# Patient Record
Sex: Female | Born: 1960 | Race: White | Hispanic: No | Marital: Married | Smoking: Never smoker
Health system: Southern US, Community
[De-identification: ages and names within clinical notes are randomized; demographics above are authoritative.]

## PROBLEM LIST (undated history)

## (undated) DIAGNOSIS — E785 Hyperlipidemia, unspecified: Secondary | ICD-10-CM

## (undated) DIAGNOSIS — R2990 Unspecified symptoms and signs involving the nervous system: Secondary | ICD-10-CM

## (undated) DIAGNOSIS — E039 Hypothyroidism, unspecified: Secondary | ICD-10-CM

## (undated) DIAGNOSIS — K760 Fatty (change of) liver, not elsewhere classified: Secondary | ICD-10-CM

## (undated) DIAGNOSIS — Z87442 Personal history of urinary calculi: Secondary | ICD-10-CM

## (undated) DIAGNOSIS — D649 Anemia, unspecified: Secondary | ICD-10-CM

## (undated) DIAGNOSIS — Z86010 Personal history of colonic polyps: Secondary | ICD-10-CM

## (undated) DIAGNOSIS — T4145XA Adverse effect of unspecified anesthetic, initial encounter: Secondary | ICD-10-CM

## (undated) DIAGNOSIS — I10 Essential (primary) hypertension: Secondary | ICD-10-CM

## (undated) DIAGNOSIS — T8859XA Other complications of anesthesia, initial encounter: Secondary | ICD-10-CM

## (undated) DIAGNOSIS — B159 Hepatitis A without hepatic coma: Secondary | ICD-10-CM

## (undated) DIAGNOSIS — K648 Other hemorrhoids: Secondary | ICD-10-CM

## (undated) DIAGNOSIS — K635 Polyp of colon: Secondary | ICD-10-CM

## (undated) DIAGNOSIS — M19012 Primary osteoarthritis, left shoulder: Secondary | ICD-10-CM

## (undated) DIAGNOSIS — E119 Type 2 diabetes mellitus without complications: Secondary | ICD-10-CM

## (undated) DIAGNOSIS — K7581 Nonalcoholic steatohepatitis (NASH): Secondary | ICD-10-CM

## (undated) DIAGNOSIS — K219 Gastro-esophageal reflux disease without esophagitis: Secondary | ICD-10-CM

## (undated) DIAGNOSIS — I639 Cerebral infarction, unspecified: Secondary | ICD-10-CM

## (undated) DIAGNOSIS — K279 Peptic ulcer, site unspecified, unspecified as acute or chronic, without hemorrhage or perforation: Secondary | ICD-10-CM

## (undated) DIAGNOSIS — K222 Esophageal obstruction: Secondary | ICD-10-CM

## (undated) DIAGNOSIS — K589 Irritable bowel syndrome without diarrhea: Secondary | ICD-10-CM

## (undated) DIAGNOSIS — E079 Disorder of thyroid, unspecified: Secondary | ICD-10-CM

## (undated) DIAGNOSIS — H269 Unspecified cataract: Secondary | ICD-10-CM

## (undated) DIAGNOSIS — K2270 Barrett's esophagus without dysplasia: Secondary | ICD-10-CM

## (undated) DIAGNOSIS — G4730 Sleep apnea, unspecified: Secondary | ICD-10-CM

## (undated) DIAGNOSIS — G709 Myoneural disorder, unspecified: Secondary | ICD-10-CM

## (undated) DIAGNOSIS — Z5189 Encounter for other specified aftercare: Secondary | ICD-10-CM

## (undated) HISTORY — DX: Hepatitis a without hepatic coma: B15.9

## (undated) HISTORY — DX: Fatty (change of) liver, not elsewhere classified: K76.0

## (undated) HISTORY — DX: Anemia, unspecified: D64.9

## (undated) HISTORY — PX: SHOULDER SURGERY: SHX246

## (undated) HISTORY — PX: TONSILLECTOMY: SUR1361

## (undated) HISTORY — DX: Gastro-esophageal reflux disease without esophagitis: K21.9

## (undated) HISTORY — DX: Personal history of colonic polyps: Z86.010

## (undated) HISTORY — DX: Irritable bowel syndrome, unspecified: K58.9

## (undated) HISTORY — PX: ABDOMINAL HYSTERECTOMY: SHX81

## (undated) HISTORY — DX: Peptic ulcer, site unspecified, unspecified as acute or chronic, without hemorrhage or perforation: K27.9

## (undated) HISTORY — PX: COLONOSCOPY: SHX174

## (undated) HISTORY — DX: Myoneural disorder, unspecified: G70.9

## (undated) HISTORY — PX: HERNIA REPAIR: SHX51

## (undated) HISTORY — PX: FOOT SURGERY: SHX648

## (undated) HISTORY — PX: LUMBAR FUSION: SHX111

## (undated) HISTORY — DX: Esophageal obstruction: K22.2

## (undated) HISTORY — DX: Encounter for other specified aftercare: Z51.89

## (undated) HISTORY — DX: Disorder of thyroid, unspecified: E07.9

## (undated) HISTORY — DX: Nonalcoholic steatohepatitis (NASH): K75.81

## (undated) HISTORY — DX: Unspecified cataract: H26.9

## (undated) HISTORY — PX: KNEE SURGERY: SHX244

## (undated) HISTORY — DX: Essential (primary) hypertension: I10

## (undated) HISTORY — DX: Hyperlipidemia, unspecified: E78.5

## (undated) HISTORY — DX: Other hemorrhoids: K64.8

## (undated) HISTORY — PX: BACK SURGERY: SHX140

## (undated) HISTORY — PX: CHOLECYSTECTOMY: SHX55

## (undated) HISTORY — DX: Barrett's esophagus without dysplasia: K22.70

## (undated) HISTORY — PX: BLADDER SURGERY: SHX569

## (undated) HISTORY — DX: Type 2 diabetes mellitus without complications: E11.9

## (undated) HISTORY — PX: BREAST BIOPSY: SHX20

## (undated) HISTORY — DX: Polyp of colon: K63.5

## (undated) HISTORY — DX: Sleep apnea, unspecified: G47.30

## (undated) HISTORY — PX: CARPAL TUNNEL RELEASE: SHX101

---
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
## 1981-11-12 DIAGNOSIS — B159 Hepatitis A without hepatic coma: Secondary | ICD-10-CM

## 1981-11-12 HISTORY — DX: Hepatitis a without hepatic coma: B15.9

## 1996-11-12 HISTORY — PX: CHOLECYSTECTOMY, LAPAROSCOPIC: SHX56

## 1999-07-20 ENCOUNTER — Encounter: Payer: Self-pay | Admitting: Internal Medicine

## 1999-07-25 ENCOUNTER — Encounter: Payer: Self-pay | Admitting: Internal Medicine

## 1999-07-31 ENCOUNTER — Encounter: Payer: Self-pay | Admitting: Internal Medicine

## 1999-08-04 ENCOUNTER — Encounter: Payer: Self-pay | Admitting: Internal Medicine

## 1999-09-05 ENCOUNTER — Encounter: Payer: Self-pay | Admitting: Internal Medicine

## 2002-08-25 ENCOUNTER — Encounter: Payer: Self-pay | Admitting: Internal Medicine

## 2002-08-28 ENCOUNTER — Encounter: Payer: Self-pay | Admitting: Internal Medicine

## 2002-09-08 ENCOUNTER — Encounter: Payer: Self-pay | Admitting: Internal Medicine

## 2002-10-26 ENCOUNTER — Encounter: Payer: Self-pay | Admitting: Internal Medicine

## 2007-08-26 ENCOUNTER — Encounter: Payer: Self-pay | Admitting: Internal Medicine

## 2007-09-29 ENCOUNTER — Encounter: Payer: Self-pay | Admitting: Internal Medicine

## 2007-10-01 ENCOUNTER — Encounter: Payer: Self-pay | Admitting: Internal Medicine

## 2007-10-14 ENCOUNTER — Encounter: Payer: Self-pay | Admitting: Internal Medicine

## 2007-10-16 ENCOUNTER — Encounter: Payer: Self-pay | Admitting: Internal Medicine

## 2007-10-20 ENCOUNTER — Encounter: Payer: Self-pay | Admitting: Internal Medicine

## 2008-04-01 ENCOUNTER — Encounter: Payer: Self-pay | Admitting: Internal Medicine

## 2008-04-02 ENCOUNTER — Encounter: Payer: Self-pay | Admitting: Internal Medicine

## 2009-11-12 HISTORY — PX: COLOSTOMY: SHX63

## 2009-11-12 HISTORY — PX: UPPER GASTROINTESTINAL ENDOSCOPY: SHX188

## 2010-03-23 ENCOUNTER — Encounter (INDEPENDENT_AMBULATORY_CARE_PROVIDER_SITE_OTHER): Payer: Self-pay | Admitting: *Deleted

## 2010-05-29 ENCOUNTER — Ambulatory Visit: Payer: Self-pay | Admitting: Internal Medicine

## 2010-05-29 DIAGNOSIS — Z860100 Personal history of colon polyps, unspecified: Secondary | ICD-10-CM

## 2010-05-29 DIAGNOSIS — K7581 Nonalcoholic steatohepatitis (NASH): Secondary | ICD-10-CM | POA: Insufficient documentation

## 2010-05-29 DIAGNOSIS — K760 Fatty (change of) liver, not elsewhere classified: Secondary | ICD-10-CM

## 2010-05-29 DIAGNOSIS — E119 Type 2 diabetes mellitus without complications: Secondary | ICD-10-CM

## 2010-05-29 DIAGNOSIS — K7689 Other specified diseases of liver: Secondary | ICD-10-CM

## 2010-05-29 DIAGNOSIS — K2270 Barrett's esophagus without dysplasia: Secondary | ICD-10-CM | POA: Insufficient documentation

## 2010-05-29 HISTORY — DX: Type 2 diabetes mellitus without complications: E11.9

## 2010-05-29 HISTORY — DX: Personal history of colon polyps, unspecified: Z86.0100

## 2010-05-29 HISTORY — DX: Fatty (change of) liver, not elsewhere classified: K76.0

## 2010-05-29 HISTORY — DX: Personal history of colonic polyps: Z86.010

## 2010-05-29 LAB — CONVERTED CEMR LAB
ALT: 64 units/L — ABNORMAL HIGH (ref 0–35)
Alkaline Phosphatase: 64 units/L (ref 39–117)
INR: 1 (ref 0.8–1.0)
Total Bilirubin: 0.6 mg/dL (ref 0.3–1.2)

## 2010-06-01 ENCOUNTER — Encounter: Admission: RE | Admit: 2010-06-01 | Discharge: 2010-06-01 | Payer: Self-pay | Admitting: Orthopedic Surgery

## 2010-06-02 ENCOUNTER — Ambulatory Visit: Payer: Self-pay | Admitting: Internal Medicine

## 2010-06-06 ENCOUNTER — Encounter: Payer: Self-pay | Admitting: Internal Medicine

## 2010-06-16 ENCOUNTER — Telehealth (INDEPENDENT_AMBULATORY_CARE_PROVIDER_SITE_OTHER): Payer: Self-pay

## 2010-06-21 ENCOUNTER — Ambulatory Visit (HOSPITAL_COMMUNITY): Admission: RE | Admit: 2010-06-21 | Discharge: 2010-06-21 | Payer: Self-pay | Admitting: Internal Medicine

## 2010-12-03 ENCOUNTER — Encounter: Payer: Self-pay | Admitting: Internal Medicine

## 2010-12-12 NOTE — Procedures (Signed)
Summary: [facility]   Imported By: Sherian Rein 06/02/2010 11:48:48  _____________________________________________________________________  External Attachment:    Type:   Image     Comment:   External Document

## 2010-12-12 NOTE — Procedures (Signed)
Summary: EGD/[facility]  EGD/[facility]   Imported By: Sherian Rein 06/02/2010 11:37:36  _____________________________________________________________________  External Attachment:    Type:   Image     Comment:   External Document

## 2010-12-12 NOTE — Procedures (Signed)
Summary: Colonoscopy  Patient: Cathy Fuller Note: All result statuses are Final unless otherwise noted.  Tests: (1) Colonoscopy (COL)   COL Colonoscopy           DONE     [facility]     [address]     [place], [place]  [address]           COLONOSCOPY PROCEDURE REPORT           PATIENT:  [name], [name]  MR#:  [id number]     BIRTHDATE:  07-16-1961, 49 yrs. old  GENDER:  female     ENDOSCOPIST:  Hedwig Morton. Juanda Chance, MD     REF. BY:  Georgian Co, PA     PROCEDURE DATE:  06/02/2010     PROCEDURE:  Colonoscopy [address]     ASA CLASS:  Class II     INDICATIONS:  Elevated Risk Screening hx adenomatous polyps 2000     and hyperplatic polyp 2003     disrrhea     MEDICATIONS:   Versed 4 mg, Fentanyl 50 mcg, Benadryl 25 mg           DESCRIPTION OF PROCEDURE:   After the risks benefits and     alternatives of the procedure were thoroughly explained, informed     consent was obtained.  Digital rectal exam was performed and     revealed no rectal masses.   The LB CF-H180AL [id number] endoscope     was introduced through the anus and advanced to the cecum, which     was identified by both the appendix and ileocecal valve, without     limitations.  The quality of the prep was good, using MiraLax.     The instrument was then slowly withdrawn as the colon was fully     examined.     <<PROCEDUREIMAGES>>           FINDINGS:  Internal hemorrhoids were found (see image3).  This was     otherwise a normal examination of the colon. With standard     forceps, biopsy was obtained and sent to pathology (see image2 and     image1). random biopsies to r/o microscopic colitis   Retroflexed     views in the rectum revealed no abnormalities.    The scope was     then withdrawn from the patient and the procedure completed.           COMPLICATIONS:  None     ENDOSCOPIC IMPRESSION:     1) Internal hemorrhoids     2) Otherwise normal examination     RECOMMENDATIONS:     1) high fiber diet  REPEAT EXAM:  In 10 year(s) for.           ______________________________     Hedwig Morton. Juanda Chance, MD           CC:           n.     eSIGNED:   Hedwig Morton. Whitnee Orzel at 06/02/2010 03:47 PM           Celene Squibb, [id number]  Note: An exclamation mark (!) indicates a result that was not dispersed into the flowsheet. Document Creation Date: 06/02/2010 3:48 PM _______________________________________________________________________  (1) Order result status: Final Collection or observation date-time: 06/02/2010 15:22 Requested date-time:  Receipt date-time:  Reported date-time:  Referring Physician:   Ordering Physician: Lina Sar [phone]) Specimen Source:  Source: Launa Grill Order Number: [id number] Lab site:  Appended Document: Colonoscopy     Procedures Next Due Date:    Colonoscopy: 05/2020

## 2010-12-12 NOTE — Letter (Signed)
Summary: Doug Sou MD  Doug Sou MD   Imported By: Sherian Rein 06/02/2010 11:59:00  _____________________________________________________________________  External Attachment:    Type:   Image     Comment:   External Document

## 2010-12-12 NOTE — Letter (Signed)
Summary: [facility] (Doug Sou MD)  [facility] Doug Sou MD)   Imported By: Sherian Rein 06/02/2010 11:53:18  _____________________________________________________________________  External Attachment:    Type:   Image     Comment:   External Document

## 2010-12-12 NOTE — Letter (Signed)
Summary: Doug Sou MD  Doug Sou MD   Imported By: Sherian Rein 06/02/2010 11:58:04  _____________________________________________________________________  External Attachment:    Type:   Image     Comment:   External Document

## 2010-12-12 NOTE — Procedures (Signed)
Summary: [facility]   Imported By: Sherian Rein 06/02/2010 11:43:35  _____________________________________________________________________  External Attachment:    Type:   Image     Comment:   External Document

## 2010-12-12 NOTE — Procedures (Signed)
Summary: EGD/[facility]  EGD/[facility]   Imported By: Sherian Rein 06/02/2010 11:46:03  _____________________________________________________________________  External Attachment:    Type:   Image     Comment:   External Document

## 2010-12-12 NOTE — Progress Notes (Signed)
Summary: Reschedule misssed [place]  Phone Note Outgoing Call Call back at [facility] Phone [phone]   Call placed by: Darcey Nora RN, CGRN,  June 16, 2010 11:50 AM Call placed to: Patient Summary of Call: Patient contacted because she was a no show for her appointment for [place], it is rescheduled for 06/21/10 11:00.  Patient  aware Initial call taken by: Darcey Nora RN, CGRN,  June 16, 2010 12:02 PM

## 2010-12-12 NOTE — Procedures (Signed)
Summary: Upper Endoscopy  Patient: [name] Note: All result statuses are Final unless otherwise noted.  Tests: (1) Upper Endoscopy (EGD)   EGD Upper Endoscopy       DONE (C)     [facility]     [address]     [place], [place]  [address]           ENDOSCOPY PROCEDURE REPORT           PATIENT:  Cathy Fuller, Cathy Fuller  MR#:  [id number]     BIRTHDATE:  21-Jul-1961, 49 yrs. old  GENDER:  female           ENDOSCOPIST:  Hedwig Morton. Juanda Chance, MD     Referred by:  Georgian Co, PA           PROCEDURE DATE:  06/02/2010     PROCEDURE:  EGD with biopsy, EGD with Savory dilation over a     guidewire     ASA CLASS:  Class II     INDICATIONS:  heartburn, GERD, h/o Barrett's Esophagus Barrett's     in 2000, not confirmed in 2003           MEDICATIONS:   Fentanyl 50 mcg, Versed 6 mg IV     TOPICAL ANESTHETIC:  Exactacain Spray           DESCRIPTION OF PROCEDURE:   After the risks benefits and     alternatives of the procedure were thoroughly explained, informed     consent was obtained.  The LB GIF-H180 [id number] endoscope was     introduced through the mouth and advanced to the second portion of     the duodenum, without limitations.  The instrument was slowly     withdrawn as the mucosa was fully examined.     <<PROCEDUREIMAGES>>           A stricture was found in the distal esophagus. mild concentric     benign appearing stricture With standard forceps, a biopsy was     obtained and sent to pathology (see image8, image7, and image9).     Savary dilation over a guidewire 15,16,52mm  A hiatal hernia was     found (see image8). s2-3 cm hiatal hernia  Moderate gastritis was     found in the antrum. mucisal hemorrhages and coffee groung     material, several erosions With standard forceps, a biopsy was     obtained and sent to pathology (see image5 and image1).  The     duodenal bulb was normal in appearance, as was the postbulbar     duodenum (see image2).    Retroflexed views  revealed no     abnormalities.    The scope was then withdrawn from the patient     and the procedure completed.           COMPLICATIONS:  None           ENDOSCOPIC IMPRESSION:     1) Stricture in the distal esophagus     2) Hiatal hernia     3) Moderate gastritis in the antrum     4) Normal duodenum     no varices     RECOMMENDATIONS:     1) Await pathology results           REPEAT EXAM:  In 2 - 3 year(s) for.           ______________________________     Hedwig Morton. Juanda Chance, MD  CC:           n.     REVISED:  06/20/2010 01:15 PM     eSIGNED:   Hedwig Morton. Marlicia Sroka at 06/20/2010 01:15 PM           Celene Squibb, [id number]  Note: An exclamation mark (!) indicates a result that was not dispersed into the flowsheet. Document Creation Date: 06/20/2010 1:16 PM _______________________________________________________________________  (1) Order result status: Final Collection or observation date-time: 06/02/2010 15:06 Requested date-time:  Receipt date-time:  Reported date-time:  Referring Physician:   Ordering Physician: Lina Sar [phone]) Specimen Source:  Source: Launa Grill Order Number: [id number] Lab site:

## 2010-12-12 NOTE — Letter (Signed)
Summary: Doug Sou MD  Doug Sou MD   Imported By: Sherian Rein 06/02/2010 11:57:02  _____________________________________________________________________  External Attachment:    Type:   Image     Comment:   External Document

## 2010-12-12 NOTE — Letter (Signed)
Summary: [facility] Instructions  [facility]  [address] [place], [place] [address]   Phone: [phone]  Fax: [phone]       Cathy Fuller    1961-04-22    MRN: [id number]       Procedure Day /Date: Friday 06/02/10     Arrival Time: 1:30pm     Procedure Time: 2:30pm     Location of Procedure:                    _x _  [facility] (4th Floor)  PREPARATION FOR COLONOSCOPY WITH MIRALAX  Starting 5 days prior to your procedure - today! do not eat nuts, seeds, popcorn, corn, beans, peas,  salads, or any raw vegetables.  Do not take any fiber supplements (e.g. Metamucil, Citrucel, and Benefiber). ____________________________________________________________________________________________________   THE DAY BEFORE YOUR PROCEDURE         DATE: 06/01/10 DAY: Thursday  1   Drink clear liquids the entire day-NO SOLID FOOD  2   Do not drink anything colored red or purple.  Avoid juices with pulp.  No orange juice.  3   Drink at least 64 oz. (8 glasses) of fluid/clear liquids during the day to prevent dehydration and help the prep work efficiently.  CLEAR LIQUIDS INCLUDE: Water Jello Ice Popsicles Tea (sugar ok, no milk/cream) Powdered fruit flavored drinks Coffee (sugar ok, no milk/cream) Gatorade Juice: apple, white grape, white cranberry  Lemonade Clear bullion, consomm, broth Carbonated beverages (any kind) Strained chicken noodle soup Hard Candy  4   Mix the entire bottle of Miralax with 64 oz. of Gatorade/Powerade in the morning and put in the refrigerator to chill.  5   At 3:00 pm take 2 Dulcolax/Bisacodyl tablets.  6   At 4:30 pm take one Reglan/Metoclopramide tablet.  7  Starting at 5:00 pm drink one 8 oz glass of the Miralax mixture every 15-20 minutes until you have finished drinking the entire 64 oz.  You should finish drinking prep around 7:30 or 8:00 pm.  8   If you are nauseated, you may take the 2nd Reglan/Metoclopramide tablet  at 6:30 pm.        9    At 8:00 pm take 2 more DULCOLAX/Bisacodyl tablets.        THE DAY OF YOUR PROCEDURE      DATE:  06/02/10 DAY: Friday  You may drink clear liquids until 12:30pm  (2 HOURS BEFORE PROCEDURE).   MEDICATION INSTRUCTIONS  Unless otherwise instructed, you should take regular prescription medications with a small sip of water as early as possible the morning of your procedure.  Diabetic patients - see separate instructions.          OTHER INSTRUCTIONS  You will need a responsible adult at least 52 years of age to accompany you and drive you home.   This person must remain in the waiting room during your procedure.  Wear loose fitting clothing that is easily removed.  Leave jewelry and other valuables at home.  However, you may wish to bring a book to read or an iPod/MP3 player to listen to music as you wait for your procedure to start.  Remove all body piercing jewelry and leave at home.  Total time from sign-in until discharge is approximately 2-3 hours.  You should go home directly after your procedure and rest.  You can resume normal activities the day after your procedure.  The day of your procedure you should  not:   Drive   Make legal decisions   Operate machinery   Drink alcohol   Return to work  You will receive specific instructions about eating, activities and medications before you leave.   The above instructions have been reviewed and explained to me by   Lamona Curl CMA Duncan Dull)  May 29, 2010 10:41 AM     I fully understand and can verbalize these instructions _____________________________ Date 05/29/10

## 2010-12-12 NOTE — Letter (Signed)
Summary: Doug Sou MD  Doug Sou MD   Imported By: Sherian Rein 06/02/2010 11:55:25  _____________________________________________________________________  External Attachment:    Type:   Image     Comment:   External Document

## 2010-12-12 NOTE — Letter (Signed)
Summary: Diabetic Instructions  [facility]  [address] [place], [place] [address]   Phone: [phone]  Fax: [phone]    CATHE BILGER 12-Oct-1961 MRN: [id number]   _ x _   ORAL DIABETIC MEDICATION INSTRUCTIONS            metformin The day before your procedure:   Take your diabetic pill as you do normally  The day of your procedure:   Do not take your diabetic pill    We will check your blood sugar levels during the admission process and again in Recovery before discharging you home  ________________________________________________________________________

## 2010-12-12 NOTE — Letter (Signed)
Summary: Doug Sou MD  Doug Sou MD   Imported By: Sherian Rein 06/02/2010 11:56:13  _____________________________________________________________________  External Attachment:    Type:   Image     Comment:   External Document

## 2010-12-12 NOTE — Assessment & Plan Note (Signed)
Summary: loose stools, IBS, fatty liver/lk   History of Present Illness Visit Type: new patient  Primary GI MD: Lina Sar MD Primary Provider: Georgian Co, PA Requesting Provider: na Chief Complaint: GERD, nausea, constipation, diarrhea, hemorrhoids, IBS, and liver problems History of Present Illness:   [name] who has moved from [place] and now lives in [place]. She has several gastrointestinal conditions that need regular followup. She brings with her about 50 pages of records which I have not had time to review yet. A quick look at the records suggest that she has a history of  NASH and is status post liver biopsy in 2009 showing grade 1 fibrosis but no cirrhosis. She also was diagnosed with Barrett's esophagus in 2000 and had a repeat endoscopy in 2003 which did not show Barrett's esophagus. She is not having any reflux symptoms and does not take any antireflux medications. There is a history of adenomatous polyps of the colon and hyperplastic polyps on 2 prior colonoscopies in 2000 and in 2003. She has irritable bowel syndrome with predominant diarrhea but is currently constipated. Her main problem has been on her inability to lose weight. She has metabolic syndrome and she is a diabetic on metformin 1,000 mg twice a day.   GI Review of Systems    Reports acid reflux, heartburn, and  nausea.      Denies abdominal pain, belching, bloating, chest pain, dysphagia with liquids, dysphagia with solids, loss of appetite, vomiting, vomiting blood, weight loss, and  weight gain.      Reports constipation, diarrhea, hemorrhoids, irritable bowel syndrome, jaundice, and  liver problems.     Denies anal fissure, black tarry stools, change in bowel habit, diverticulosis, fecal incontinence, heme positive stool, light color stool, rectal bleeding, and  rectal pain.    Current Medications (verified): 1)  Vicodin Es 7.5-750 Mg Tabs (Hydrocodone-Acetaminophen) .... As Needed  For Pain 2)  Flexeril 10 Mg Tabs (Cyclobenzaprine Hcl) .... As Needed 3)  Metformin Hcl 1000 Mg Tabs (Metformin Hcl) .... One Tablet By Mouth Two Times A Day 4)  Lisinopril 10 Mg Tabs (Lisinopril) .... One Tablet By Mouth Once Daily 5)  Pravastatin Sodium 20 Mg Tabs (Pravastatin Sodium) .... One Tablet By Mouth Once Daily 6)  Melabic .... Once Daily By Mouth  Allergies (verified): 1)  ! Asa 2)  ! * Tape  Past History:  Past Medical History: Barretts Esophagus Liver Disease  Colon Polyps  Diabetes GERD Hepatitis A---1983 Hyperlipidemia Hypertension Irritable Bowel Syndrome Obesity Peptic Ulcer Disease Sleep Apnea  Past Surgical History: Cholecystectomy Hernia Surgery Hysterectomy Carpal Tunnel Surgery Bilateral  Back Surgery  Left Knee Surgery  Left Shoulder Surgery  Breast Biopsy--Right Bladder Surgery x2   Family History: Family History of Colon Cancer: Maternal Uncle  Family History of Colitis: Daughter  Family History of Diabetes: Younger Brother  Family History of Heart Disease: Both Parents   Social History: Disability Married 2 childern Patient has never smoked.  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  The patient denies allergy/sinus, anemia, anxiety-new, arthritis/joint pain, blood in urine, breast changes/lumps, confusion, cough, coughing up blood, depression-new, fainting, fatigue, fever, hearing problems, heart murmur, heart  rhythm changes, itching, menstrual pain, night sweats, nosebleeds, pregnancy symptoms, shortness of breath, skin rash, sore throat, swollen lymph glands, thirst - excessive , urination - excessive , urination changes/pain, vision changes, and voice change.          Pertinent positive and negative review of systems were noted in the above HPI. All other ROS was otherwise negative.   Vital Signs:  Patient profile:   50 year old female Height:      65 inches Weight:      235 pounds BMI:     39.25 BSA:     2.12 Pulse rate:   76 / minute Pulse rhythm:   regular BP sitting:   132 / 80  (left arm) Cuff size:   regular  Vitals Entered By: Ok Anis CMA (May 29, 2010 9:42 AM)  Physical Exam  General:  alert, oriented and in no distress. Eyes:  nonicteric. Neck:  no bruits. Lungs:  Clear throughout to auscultation. Heart:  Regular rate and rhythm; no murmurs, rubs,  or bruits. Abdomen:  large protuberant abdomen which is nontender. Post laparoscopic cholecystectomy scars. Liver edge at costal margin. Overall span is not enlarged. There is no ascites. No venous distention. Rectal:  normal rectal tone with external hemorrhoidal tags. Stool is Hemoccult-negative. Extremities:  1+ pedal edema. Neurologic:  no asterixis. Skin:  no stigmata of chronic liver disease. Psych:  Alert and cooperative. Normal mood and affect.   Impression & Recommendations:  Problem # 1:  DM (ICD-250.00) Patient is on metformin. She is to follow up with Georgian Co, PA-C  Problem # 2:  BARRETTS ESOPHAGUS (ICD-530.85) Patient's last upper endoscopy in 2003 did not show Barrett's esophagus. She is due for a recall endoscopy at this time.  Orders: T-Alpha-Fetoprotein Serum (04540-98119) TLB-Hepatic/Liver Function Pnl (80076-HEPATIC) TLB-PT (Protime) (85610-PTP) TLB-GGT (Gamma GT) (82977-GGT) Colon/Endo (Colon/Endo)  Problem # 3:  COLONIC POLYPS, ADENOMATOUS, HX OF (ICD-V12.72) Patient is due for a repeat colonoscopy. She has a history of irritable bowel syndrome and diarrhea. She is currently having normal bowel movements.  Orders: T-Alpha-Fetoprotein Serum ([id number]) TLB-Hepatic/Liver Function Pnl (80076-HEPATIC) TLB-PT (Protime) (85610-PTP) TLB-GGT  (Gamma GT) (82977-GGT) Colon/Endo (Colon/Endo)  Problem # 4:  FATTY LIVER DISEASE (ICD-571.8) Assessment: Comment Only  Orders: T-Alpha-Fetoprotein Serum ([id number]) TLB-Hepatic/Liver Function Pnl (80076-HEPATIC) TLB-PT (Protime) (85610-PTP) TLB-GGT (Gamma GT) (82977-GGT) Ultrasound Abdomen (UAS)  Patient Instructions: 1)  discussed weight loss. 2)  Exercise daily. 3)  Upper endoscopy and colonoscopy for followup of chronic conditions. 4)  Hepatic function, alpha-fetoprotein and GGT today. 5)  I will review all medical records from [place] and if there is any change in plans, we will let her know. 6)  Copy sent to : Georgian Co, PA-C 7)  The medication list was reviewed and reconciled.  All changed / newly prescribed medications were explained.  A complete medication list was provided to the patient / caregiver. Prescriptions: DULCOLAX 5 MG  TBEC (BISACODYL) Day before procedure take 2 at 3pm and 2 at 8pm.  #4 x 0   Entered by:   Lamona Curl CMA (AAMA)   Authorized by:   Hart Carwin MD   Signed by:   Lamona Curl CMA (AAMA) on 05/29/2010   Method used:   Electronically to        [facility] [phone]* (retail)       [address]       [place], [place]  [address]       Ph:  [phone]       Fax: [phone]   RxID:   [id number] REGLAN 10 MG  TABS (METOCLOPRAMIDE HCL) As per prep instructions.  #2 x 0   Entered by:   Lamona Curl CMA (AAMA)   Authorized by:   Hart Carwin MD   Signed by:   Lamona Curl CMA (AAMA) on 05/29/2010   Method used:   Electronically to        [facility] [phone]* (retail)       [address]       [place], [place]  [address]       Ph: [phone]       Fax: [phone]   RxID:   [id number] MIRALAX   POWD (POLYETHYLENE GLYCOL 3350) As per prep  instructions.  #255gm x 0   Entered by:   Lamona Curl CMA (AAMA)   Authorized by:   Hart Carwin MD   Signed by:   Lamona Curl CMA (AAMA) on  05/29/2010   Method used:   Electronically to        [facility] [phone]* (retail)       [address] [place] [address]       [place], [place]  [address]       Ph: [phone]       Fax: [phone]   RxID:   [id number]

## 2010-12-12 NOTE — Procedures (Signed)
Summary: [facility]   Imported By: Sherian Rein 06/02/2010 11:46:51  _____________________________________________________________________  External Attachment:    Type:   Image     Comment:   External Document

## 2010-12-12 NOTE — Letter (Signed)
Summary: Patient [facility] Biopsy Results   [facility]astroenter[facility]  [address] [place], [place] [address]   Phone: [phone]  Fax: [phone]        June 06, 2010 MRN: [id number]    APRILL BANKO [address] [place], [place]  [address]    Dear Ms. Doshi,  I am pleased to inform you that the biopsies taken during your recent endoscopic examination did not show any evidence of cancer upon pathologic examination.  Additional information/recommendations:  __No further action is needed at this time.  Please follow-up with      your primary care physician for your other healthcare needs.  __ Please call [phone] to schedule a return visit to review      your condition.  _x_ Continue with the treatment plan as outlined on the day of your      exam.     Please call us if you are having persistent problems or have questions about your condition that have not been fully answered at this time.  Sincerely,  Hart Carwin MD  This letter has been electronically signed by your physician.  Appended Document: Patient Notice-Endo Biopsy Results letter mailed 8.1.2011

## 2010-12-12 NOTE — Letter (Signed)
Summary: Doug Sou MD  Doug Sou MD   Imported By: Sherian Rein 06/02/2010 11:54:23  _____________________________________________________________________  External Attachment:    Type:   Image     Comment:   External Document

## 2010-12-12 NOTE — Letter (Signed)
Summary: Doug Sou MD  Doug Sou MD   Imported By: Sherian Rein 06/02/2010 11:59:48  _____________________________________________________________________  External Attachment:    Type:   Image     Comment:   External Document

## 2010-12-12 NOTE — Letter (Signed)
Summary: New Patient letter  [facility]  [address] [place], [place] [address]   Phone: [phone]  Fax: [phone]       03/23/2010 MRN: [id number]  Cathy Fuller [address] [place], [place]  [address]  Dear Cathy Fuller,  Welcome to the Gastroenterology Division at [facility].    You are scheduled to see Dr. Juanda Chance on 05/29/2010 at  9:15am on the 3rd floor at [facility], [address]  We ask that you try to arrive at our office 15 minutes prior to your appointment time to allow for check-in.  We would like you to complete the enclosed self-administered evaluation form prior to your visit and bring it with you on the day of your appointment.  We will review it with you.  Also, please bring a complete list of all your medications or, if you prefer, bring the medication bottles and we will list them.  Please bring your insurance card so that we may make a copy of it.  If your insurance requires a referral to see a specialist, please bring your referral form from your primary care physician.  Co-payments are due at the time of your visit and may be paid by cash, check or credit card.     Your office visit will consist of a consult with your physician (includes a physical exam), any laboratory testing he/she may order, scheduling of any necessary diagnostic testing (e.g. x-ray, ultrasound, CT-scan), and scheduling of a procedure (e.g. Endoscopy, Colonoscopy) if required.  Please allow enough time on your schedule to allow for any/all of these possibilities.    If you cannot keep your appointment, please call [phone] to cancel or reschedule prior to your appointment date.  This allows [place] the opportunity to schedule an appointment for another patient in need of care.  If you do not cancel or reschedule by 5 p.m. the business day prior to your appointment date, you will be charged a $50.00 late cancellation/no-show fee.    Thank you for choosing  [facility] for your medical needs.  We appreciate the opportunity to care for you.  Please visit [place] at our website  to learn more about our practice.                     Sincerely,                                                             The Gastroenterology Division

## 2011-01-27 LAB — GLUCOSE, CAPILLARY: Glucose-Capillary: 96 mg/dL (ref 70–99)

## 2011-06-06 ENCOUNTER — Other Ambulatory Visit: Payer: Self-pay | Admitting: Internal Medicine

## 2011-06-06 DIAGNOSIS — K760 Fatty (change of) liver, not elsewhere classified: Secondary | ICD-10-CM

## 2011-06-22 ENCOUNTER — Telehealth: Payer: Self-pay | Admitting: *Deleted

## 2011-06-22 ENCOUNTER — Ambulatory Visit (HOSPITAL_COMMUNITY)
Admission: RE | Admit: 2011-06-22 | Discharge: 2011-06-22 | Disposition: A | Discharge status: Home / Self Care | Payer: 59 | Source: Ambulatory Visit | Attending: Internal Medicine | Admitting: Internal Medicine

## 2011-06-22 DIAGNOSIS — Z9089 Acquired absence of other organs: Secondary | ICD-10-CM | POA: Insufficient documentation

## 2011-06-22 DIAGNOSIS — K760 Fatty (change of) liver, not elsewhere classified: Secondary | ICD-10-CM

## 2011-06-22 DIAGNOSIS — K7689 Other specified diseases of liver: Secondary | ICD-10-CM | POA: Insufficient documentation

## 2011-06-22 NOTE — Telephone Encounter (Signed)
Left a message for patient to call me back.

## 2011-06-22 NOTE — Telephone Encounter (Signed)
Message copied by Daphine Deutscher on Fri Jun 22, 2011  3:26 PM ------      Message from: Hart Carwin      Created: Fri Jun 22, 2011 12:28 PM       Please call pt with findings of fatty liver on the ultrasound. She ought to come to have her LFT's checked.

## 2011-06-25 ENCOUNTER — Encounter: Payer: Self-pay | Admitting: *Deleted

## 2011-06-25 NOTE — Telephone Encounter (Signed)
error 

## 2011-06-25 NOTE — Telephone Encounter (Signed)
Spoke with patient and she wants to have labs 07/06/11. Labs in EPIC.

## 2011-06-25 NOTE — Telephone Encounter (Signed)
Left a message to call me back

## 2011-07-10 ENCOUNTER — Telehealth: Payer: Self-pay | Admitting: *Deleted

## 2011-07-10 NOTE — Telephone Encounter (Signed)
Message copied by Daphine Deutscher on Tue Jul 10, 2011  3:00 PM ------      Message from: Daphine Deutscher      Created: Mon Jun 25, 2011  9:37 AM       Did patient come for LFT(DB)

## 2011-07-10 NOTE — Telephone Encounter (Signed)
Patient did not come for LFT's on 07/06/11 as she indicated she would. Called patient and she states she is in [place] now and she will call us when she gets back in [place] to set up having labs done.

## 2011-07-25 ENCOUNTER — Telehealth: Payer: Self-pay | Admitting: *Deleted

## 2011-07-25 NOTE — Telephone Encounter (Signed)
Message copied by Daphine Deutscher on Wed Jul 25, 2011  9:13 AM ------      Message from: Daphine Deutscher      Created: Tue Jul 10, 2011  3:01 PM       Did patient ever call and set up getting LFT's (DB)

## 2011-07-25 NOTE — Telephone Encounter (Signed)
Left a message for patient to call re: lab work appointment.

## 2011-08-02 NOTE — Telephone Encounter (Signed)
Left a message for patient to call me. 

## 2011-08-14 ENCOUNTER — Encounter: Payer: Self-pay | Admitting: *Deleted

## 2011-08-14 NOTE — Telephone Encounter (Signed)
repest LFT's when pt returns to [facility].

## 2011-08-14 NOTE — Telephone Encounter (Signed)
Patient not returning my calls re: her lab work(LFT) that was due in August. Patient had been out of town and was to call me as soon as she returned. Mailed a letter to patient.

## 2011-10-31 ENCOUNTER — Ambulatory Visit (INDEPENDENT_AMBULATORY_CARE_PROVIDER_SITE_OTHER): Payer: 59 | Admitting: Physician Assistant

## 2011-10-31 DIAGNOSIS — I10 Essential (primary) hypertension: Secondary | ICD-10-CM

## 2011-10-31 DIAGNOSIS — N390 Urinary tract infection, site not specified: Secondary | ICD-10-CM

## 2011-10-31 DIAGNOSIS — E669 Obesity, unspecified: Secondary | ICD-10-CM

## 2012-01-09 ENCOUNTER — Ambulatory Visit (INDEPENDENT_AMBULATORY_CARE_PROVIDER_SITE_OTHER): Payer: BC Managed Care – PPO | Admitting: Emergency Medicine

## 2012-01-09 ENCOUNTER — Ambulatory Visit: Payer: BC Managed Care – PPO

## 2012-01-09 VITALS — BP 144/80 | HR 93 | Temp 98.5°F | Resp 18 | Ht 64.5 in | Wt 229.0 lb

## 2012-01-09 DIAGNOSIS — S5010XA Contusion of unspecified forearm, initial encounter: Secondary | ICD-10-CM

## 2012-01-09 DIAGNOSIS — S5000XA Contusion of unspecified elbow, initial encounter: Secondary | ICD-10-CM

## 2012-01-09 MED ORDER — HYDROCODONE-ACETAMINOPHEN 5-325 MG PO TABS: ORAL_TABLET | ORAL | Status: DC

## 2012-01-09 NOTE — Progress Notes (Signed)
Addended by: Lesle Chris A on: 01/09/2012 05:14 PM   Modules accepted: Orders

## 2012-01-09 NOTE — Progress Notes (Signed)
  Subjective:    Patient ID: Cathy Fuller, female    DOB: 22-Mar-1961, 51 y.o.   MRN: [id number]  HPI patient states she fell 5 days ago. She has a lot of discomfort along the ulnar aspect of her left arm. She also has pain over her olecranon. She initially developed some bruising which has improved.    Review of Systems noncontributory except as relates to her recent fall.     Objective:   Physical Exam there is tenderness over the olecranon. There is tenderness over the ulna. There is pain with rotation of the elbow. She does have full flexion and extension.   [facility] reading (PRIMARY) by  Dr.Mishon Blubaugh is a prominent area midshaft ulna which appears to be where the nutrient vessel enters. No definite fracture is identified..      Assessment & Plan:  Would check a x-ray of the left elbow and ulna to be sure there's not a fracture.

## 2012-01-30 ENCOUNTER — Encounter: Payer: BC Managed Care – PPO | Admitting: Physician Assistant

## 2012-01-30 ENCOUNTER — Ambulatory Visit (INDEPENDENT_AMBULATORY_CARE_PROVIDER_SITE_OTHER): Payer: Medicare Other | Admitting: Physician Assistant

## 2012-01-30 ENCOUNTER — Encounter: Payer: Self-pay | Admitting: Physician Assistant

## 2012-01-30 DIAGNOSIS — Z0000 Encounter for general adult medical examination without abnormal findings: Secondary | ICD-10-CM | POA: Diagnosis not present

## 2012-01-30 DIAGNOSIS — E7800 Pure hypercholesterolemia, unspecified: Secondary | ICD-10-CM | POA: Diagnosis not present

## 2012-01-30 DIAGNOSIS — E039 Hypothyroidism, unspecified: Secondary | ICD-10-CM | POA: Diagnosis not present

## 2012-01-30 DIAGNOSIS — E119 Type 2 diabetes mellitus without complications: Secondary | ICD-10-CM | POA: Diagnosis not present

## 2012-01-30 DIAGNOSIS — I10 Essential (primary) hypertension: Secondary | ICD-10-CM

## 2012-01-30 DIAGNOSIS — IMO0002 Reserved for concepts with insufficient information to code with codable children: Secondary | ICD-10-CM

## 2012-01-30 DIAGNOSIS — S5000XA Contusion of unspecified elbow, initial encounter: Secondary | ICD-10-CM

## 2012-01-30 DIAGNOSIS — S5010XA Contusion of unspecified forearm, initial encounter: Secondary | ICD-10-CM

## 2012-01-30 HISTORY — DX: Hypothyroidism, unspecified: E03.9

## 2012-01-30 LAB — CBC WITH DIFFERENTIAL/PLATELET
Basophils Absolute: 0 10*3/uL (ref 0.0–0.1)
Basophils Relative: 0 % (ref 0–1)
Eosinophils Absolute: 0.3 10*3/uL (ref 0.0–0.7)
HCT: 38.2 % (ref 36.0–46.0)
Lymphocytes Relative: 36 % (ref 12–46)
MCH: 30 pg (ref 26.0–34.0)
MCHC: 31.9 g/dL (ref 30.0–36.0)
Monocytes Absolute: 0.3 10*3/uL (ref 0.1–1.0)
Neutro Abs: 4.8 10*3/uL (ref 1.7–7.7)
Neutrophils Relative %: 56 % (ref 43–77)

## 2012-01-30 LAB — COMPREHENSIVE METABOLIC PANEL
Albumin: 4.6 g/dL (ref 3.5–5.2)
Alkaline Phosphatase: 64 U/L (ref 39–117)
BUN: 13 mg/dL (ref 6–23)
Calcium: 9.7 mg/dL (ref 8.4–10.5)
Chloride: 107 mEq/L (ref 96–112)
Creat: 0.73 mg/dL (ref 0.50–1.10)
Glucose, Bld: 101 mg/dL — ABNORMAL HIGH (ref 70–99)
Potassium: 3.9 mEq/L (ref 3.5–5.3)

## 2012-01-30 LAB — POCT WET PREP WITH KOH
Clue Cells Wet Prep HPF POC: NEGATIVE
Yeast Wet Prep HPF POC: NEGATIVE

## 2012-01-30 LAB — LIPID PANEL
Cholesterol: 167 mg/dL (ref 0–200)
LDL Cholesterol: 86 mg/dL (ref 0–99)

## 2012-01-30 MED ORDER — METFORMIN HCL 1000 MG PO TABS: 1000.0000 mg | ORAL_TABLET | Freq: Two times a day (BID) | ORAL | Status: DC

## 2012-01-30 MED ORDER — CYCLOBENZAPRINE HCL 10 MG PO TABS: 10.0000 mg | ORAL_TABLET | Freq: Three times a day (TID) | ORAL | Status: DC | PRN

## 2012-01-30 MED ORDER — LOSARTAN POTASSIUM-HCTZ 100-12.5 MG PO TABS: 1.0000 | ORAL_TABLET | Freq: Every day | ORAL | Status: DC

## 2012-01-30 MED ORDER — LEVOTHYROXINE SODIUM 25 MCG PO TABS: 25.0000 ug | ORAL_TABLET | Freq: Every day | ORAL | Status: DC

## 2012-01-30 MED ORDER — MELOXICAM 15 MG PO TABS: ORAL_TABLET | ORAL | Status: DC

## 2012-01-30 MED ORDER — HYDROCODONE-ACETAMINOPHEN 5-325 MG PO TABS: ORAL_TABLET | ORAL | Status: DC

## 2012-01-30 NOTE — Progress Notes (Signed)
  Subjective:    Patient ID: Cathy Fuller, female    DOB: [birth year], 51 y.o.   MRN: [id number]  HPI  Here for CPE.  See scanned in form.  Only c/o is that of resistance/firmness ~5-7cm into the vagina when attempting intercourse and that it is painful.  Patient states she has always had a problem with vaginal dryness. She admits to non-compliance on her BP meds bc it causes her to have a cough at night  Review of Systems  All other systems reviewed and are negative.       Objective:   Physical Exam  Nursing note and vitals reviewed. Constitutional: She is oriented to person, place, and time. She appears well-developed and well-nourished.       obese  HENT:  Head: Normocephalic and atraumatic.  Right Ear: External ear normal.  Nose: Nose normal.  Mouth/Throat: Oropharynx is clear and moist. No oropharyngeal exudate.       L ear blocked w cerumen deep in canal.  Eyes: Conjunctivae and EOM are normal. Pupils are equal, round, and reactive to light.  Neck: Normal range of motion. Neck supple. No thyromegaly present.  Cardiovascular: Normal rate, regular rhythm, normal heart sounds and intact distal pulses.  Exam reveals no gallop and no friction rub.   No murmur heard. Pulmonary/Chest: Effort normal and breath sounds normal.  Abdominal: Soft. Bowel sounds are normal. She exhibits no distension and no mass. There is no tenderness. There is no rebound and no guarding.  Genitourinary: No vaginal discharge found.       Mild vaginal atrophy with firm, but normal appearing pouch from previous hysterectomy.  No prolpasing  Musculoskeletal: Normal range of motion.  Lymphadenopathy:    She has no cervical adenopathy.  Neurological: She is alert and oriented to person, place, and time. She has normal reflexes. No cranial nerve deficit. Coordination normal.  Skin: Skin is warm and dry.  Psychiatric: She has a normal mood and affect. Her behavior is normal.      Results for orders placed in  visit on 01/30/12  POCT GLYCOSYLATED HEMOGLOBIN (HGB A1C)      Component Value Range   Hemoglobin A1C 6.3    POCT WET PREP WITH KOH      Component Value Range   Trichomonas, UA Negative     Clue Cells Wet Prep HPF POC neg     Epithelial Wet Prep HPF POC 0-10     Yeast Wet Prep HPF POC neg     Bacteria Wet Prep HPF POC small     RBC Wet Prep HPF POC neg     WBC Wet Prep HPF POC 0-1     KOH Prep POC Negative         Assessment & Plan:  CPE Diabetes-slight improvement (last A1C was 6.5%)Once again encouraged on dietary changes and exercise/weight loss for better control. Hypertension-suboptimal control, non-compliance, and cough. D/C Lisinopril.  Start Hyzaar. Hypothyroidism-Checking labs, continue synthroid for now. 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  Advised adequate/increased foreplay and lubricants such as Astroglide. Reck BMET in 6 weeks.

## 2012-02-20 ENCOUNTER — Ambulatory Visit (INDEPENDENT_AMBULATORY_CARE_PROVIDER_SITE_OTHER): Payer: BC Managed Care – PPO | Admitting: Emergency Medicine

## 2012-02-20 VITALS — BP 118/77 | HR 92 | Temp 97.9°F | Resp 16 | Ht 64.5 in | Wt 225.0 lb

## 2012-02-20 DIAGNOSIS — T148 Other injury of unspecified body region: Secondary | ICD-10-CM | POA: Diagnosis not present

## 2012-02-20 DIAGNOSIS — E039 Hypothyroidism, unspecified: Secondary | ICD-10-CM

## 2012-02-20 DIAGNOSIS — W57XXXA Bitten or stung by nonvenomous insect and other nonvenomous arthropods, initial encounter: Secondary | ICD-10-CM

## 2012-02-20 LAB — T4, FREE: Free T4: 1.44 ng/dL (ref 0.80–1.80)

## 2012-02-20 NOTE — Progress Notes (Signed)
  Subjective:    Patient ID: Cathy Fuller, female    DOB: [birth year], 51 y.o.   MRN: [id number]  HPI patient here with 3 complaints. First is she needs to have her thyroid checked because she is on thyroid medication 0.025 and taking one half tablet daily. She is due to have her thyroid levels checked. Second complaint is of wax in her left ear and she is unable to hear because of that. Third complaint is she had ticks present on her back and left in the cubital area approximately 2 weeks ago which were removed. She has a family member who had Lyme disease and she is very concerned that she may also develop that.    Review of Systems she denies any systemic symptoms of Lyme disease. She has not had a rash around these areas she has not had fever.     Objective:   Physical Exam  Constitutional: She appears well-developed and well-nourished.  HENT:  Right Ear: External ear normal.  Left Ear: External ear normal.  Neck: No JVD present. No tracheal deviation present. No thyromegaly present.  Cardiovascular: Normal rate and regular rhythm.   Pulmonary/Chest: No respiratory distress. She has no wheezes. She has no rales. She exhibits no tenderness.  Abdominal: She exhibits no mass. There is no tenderness. There is no rebound and no guarding.  Lymphadenopathy:    She has no cervical adenopathy.  Skin:       She has small bite-like areas in the left antecubital fossa and one area over the left shoulder both measure about 2 mm          Assessment & Plan:   We'll check Lyme titers and go ahead and irrigate the left ear.

## 2012-03-12 ENCOUNTER — Ambulatory Visit (INDEPENDENT_AMBULATORY_CARE_PROVIDER_SITE_OTHER): Payer: Medicare Other | Admitting: Physician Assistant

## 2012-03-12 VITALS — BP 121/80 | HR 67 | Temp 98.3°F | Resp 16 | Ht 64.75 in | Wt 224.2 lb

## 2012-03-12 DIAGNOSIS — I10 Essential (primary) hypertension: Secondary | ICD-10-CM

## 2012-03-12 DIAGNOSIS — R05 Cough: Secondary | ICD-10-CM

## 2012-03-12 LAB — BASIC METABOLIC PANEL
CO2: 25 mEq/L (ref 19–32)
Chloride: 107 mEq/L (ref 96–112)
Creat: 0.72 mg/dL (ref 0.50–1.10)

## 2012-03-12 NOTE — Progress Notes (Signed)
  Subjective:    Patient ID: Cathy Fuller, female    DOB: [birth year], 51 y.o.   MRN: [id number]  HPI here for recheck since stopping lisinopril ~5 weeks ago secondary to cough and starting Hyzaar.  Her BPs at home have been very well controlled.  Her cough has resolved  Review of Systems  All other systems reviewed and are negative.       Objective:   Physical Exam  Nursing note and vitals reviewed.         Assessment & Plan:  htn-very well controlled. Check BMET due to med change and continue Hyzaar.  No changes. F/up end of July (daughter getting married in [place] in mid-July)

## 2012-06-04 ENCOUNTER — Ambulatory Visit (INDEPENDENT_AMBULATORY_CARE_PROVIDER_SITE_OTHER): Payer: Medicare Other | Admitting: Physician Assistant

## 2012-06-04 ENCOUNTER — Encounter: Payer: Self-pay | Admitting: Physician Assistant

## 2012-06-04 VITALS — BP 150/96 | HR 62 | Temp 97.7°F | Resp 16 | Ht 64.5 in | Wt 217.2 lb

## 2012-06-04 DIAGNOSIS — E039 Hypothyroidism, unspecified: Secondary | ICD-10-CM | POA: Diagnosis not present

## 2012-06-04 DIAGNOSIS — I10 Essential (primary) hypertension: Secondary | ICD-10-CM | POA: Diagnosis not present

## 2012-06-04 DIAGNOSIS — E119 Type 2 diabetes mellitus without complications: Secondary | ICD-10-CM | POA: Diagnosis not present

## 2012-06-04 DIAGNOSIS — S5000XA Contusion of unspecified elbow, initial encounter: Secondary | ICD-10-CM

## 2012-06-04 DIAGNOSIS — E7800 Pure hypercholesterolemia, unspecified: Secondary | ICD-10-CM

## 2012-06-04 DIAGNOSIS — S5010XA Contusion of unspecified forearm, initial encounter: Secondary | ICD-10-CM

## 2012-06-04 LAB — COMPREHENSIVE METABOLIC PANEL
BUN: 13 mg/dL (ref 6–23)
CO2: 28 mEq/L (ref 19–32)
Creat: 0.84 mg/dL (ref 0.50–1.10)
Glucose, Bld: 105 mg/dL — ABNORMAL HIGH (ref 70–99)
Total Bilirubin: 0.7 mg/dL (ref 0.3–1.2)

## 2012-06-04 LAB — CBC WITH DIFFERENTIAL/PLATELET
Eosinophils Absolute: 0.3 10*3/uL (ref 0.0–0.7)
Eosinophils Relative: 4 % (ref 0–5)
Lymphs Abs: 2.1 10*3/uL (ref 0.7–4.0)
MCH: 30 pg (ref 26.0–34.0)
MCV: 89.5 fL (ref 78.0–100.0)
Monocytes Relative: 4 % (ref 3–12)
Platelets: 197 10*3/uL (ref 150–400)
RBC: 3.9 MIL/uL (ref 3.87–5.11)

## 2012-06-04 MED ORDER — LOSARTAN POTASSIUM-HCTZ 100-12.5 MG PO TABS: 1.0000 | ORAL_TABLET | Freq: Every day | ORAL | Status: DC

## 2012-06-04 MED ORDER — MELOXICAM 15 MG PO TABS: ORAL_TABLET | ORAL | Status: DC

## 2012-06-04 MED ORDER — HYDROCODONE-ACETAMINOPHEN 5-325 MG PO TABS: ORAL_TABLET | ORAL | Status: DC

## 2012-06-04 MED ORDER — LEVOTHYROXINE SODIUM 25 MCG PO TABS: 25.0000 ug | ORAL_TABLET | Freq: Every day | ORAL | Status: DC

## 2012-06-04 MED ORDER — CYCLOBENZAPRINE HCL 10 MG PO TABS: 10.0000 mg | ORAL_TABLET | Freq: Three times a day (TID) | ORAL | Status: DC | PRN

## 2012-06-04 MED ORDER — METFORMIN HCL 1000 MG PO TABS: 1000.0000 mg | ORAL_TABLET | Freq: Two times a day (BID) | ORAL | Status: DC

## 2012-06-04 NOTE — Progress Notes (Signed)
  Subjective:    Patient ID: Cathy Fuller, female    DOB: 10-18-1961, 51 y.o.   MRN: [id number]  HPI doing well.  She has been out of her blodd pressure medicine for 2-3 days.  It is controlled when she is taking her meds.  She has her blood sugar log with her and blood sugars have been running low 100s except when she eats something fast food/high sugar.  She denies any vision changes. She has yet to get her mammogram and is about to go to [place] for 2 months.  Her daughter recently got married and still lives there.  Review of Systems  All other systems reviewed and are negative.       Objective:   Physical Exam  Nursing note and vitals reviewed. Constitutional: She appears well-developed and well-nourished.  HENT:  Head: Normocephalic and atraumatic.  Cardiovascular: Normal rate, regular rhythm and normal heart sounds.   Pulmonary/Chest: Effort normal and breath sounds normal.  Neurological:       B feet without ulceration or sores and sensory is in tact.  Skin: Skin is warm and dry.      Results for orders placed in visit on 06/04/12  POCT GLYCOSYLATED HEMOGLOBIN (HGB A1C)      Component Value Range   Hemoglobin A1C 5.4         Assessment & Plan:  DM-controlled.  Great work on continued weight loss.  7 lbs since last OV! htn-not controlled today bc hasn't taken medication, but she will restart that today. Hypothyroidism-check labs F/up in 6 months.  Get MMG!!!

## 2012-06-06 ENCOUNTER — Encounter: Payer: Self-pay | Admitting: *Deleted

## 2012-11-26 ENCOUNTER — Encounter: Payer: Self-pay | Admitting: Physician Assistant

## 2012-11-26 ENCOUNTER — Ambulatory Visit: Payer: BC Managed Care – PPO | Admitting: Physician Assistant

## 2012-11-26 ENCOUNTER — Ambulatory Visit (INDEPENDENT_AMBULATORY_CARE_PROVIDER_SITE_OTHER): Payer: BC Managed Care – PPO | Admitting: Physician Assistant

## 2012-11-26 VITALS — BP 131/81 | HR 78 | Temp 97.2°F | Resp 17 | Ht 64.5 in | Wt 225.0 lb

## 2012-11-26 DIAGNOSIS — E7800 Pure hypercholesterolemia, unspecified: Secondary | ICD-10-CM

## 2012-11-26 DIAGNOSIS — L987 Excessive and redundant skin and subcutaneous tissue: Secondary | ICD-10-CM

## 2012-11-26 DIAGNOSIS — Z293 Encounter for prophylactic fluoride administration: Secondary | ICD-10-CM

## 2012-11-26 DIAGNOSIS — S5010XA Contusion of unspecified forearm, initial encounter: Secondary | ICD-10-CM

## 2012-11-26 DIAGNOSIS — I10 Essential (primary) hypertension: Secondary | ICD-10-CM

## 2012-11-26 DIAGNOSIS — E119 Type 2 diabetes mellitus without complications: Secondary | ICD-10-CM | POA: Diagnosis not present

## 2012-11-26 DIAGNOSIS — Q828 Other specified congenital malformations of skin: Secondary | ICD-10-CM

## 2012-11-26 DIAGNOSIS — E669 Obesity, unspecified: Secondary | ICD-10-CM | POA: Diagnosis not present

## 2012-11-26 DIAGNOSIS — Z79899 Other long term (current) drug therapy: Secondary | ICD-10-CM

## 2012-11-26 LAB — LIPID PANEL
Cholesterol: 224 mg/dL — ABNORMAL HIGH (ref 0–200)
HDL: 55 mg/dL
LDL Cholesterol: 131 mg/dL — ABNORMAL HIGH (ref 0–99)
Total CHOL/HDL Ratio: 4.1 ratio
Triglycerides: 191 mg/dL — ABNORMAL HIGH
VLDL: 38 mg/dL (ref 0–40)

## 2012-11-26 LAB — COMPREHENSIVE METABOLIC PANEL
ALT: 32 U/L (ref 0–35)
AST: 30 U/L (ref 0–37)
Albumin: 4.8 g/dL (ref 3.5–5.2)
Calcium: 10.2 mg/dL (ref 8.4–10.5)
Chloride: 104 mEq/L (ref 96–112)
Potassium: 4 mEq/L (ref 3.5–5.3)
Sodium: 139 mEq/L (ref 135–145)
Total Protein: 7.6 g/dL (ref 6.0–8.3)

## 2012-11-26 LAB — CBC WITH DIFFERENTIAL/PLATELET
Basophils Absolute: 0 10*3/uL (ref 0.0–0.1)
Lymphocytes Relative: 31 % (ref 12–46)
Neutro Abs: 5.3 10*3/uL (ref 1.7–7.7)
Platelets: 242 10*3/uL (ref 150–400)
RBC: 4.25 MIL/uL (ref 3.87–5.11)
RDW: 13.7 % (ref 11.5–15.5)
WBC: 8.6 10*3/uL (ref 4.0–10.5)

## 2012-11-26 LAB — POCT GLYCOSYLATED HEMOGLOBIN (HGB A1C): Hemoglobin A1C: 6.1

## 2012-11-26 LAB — TSH: TSH: 2.153 u[IU]/mL (ref 0.350–4.500)

## 2012-11-26 MED ORDER — METFORMIN HCL 1000 MG PO TABS: 1000.0000 mg | ORAL_TABLET | Freq: Two times a day (BID) | ORAL | Status: DC

## 2012-11-26 MED ORDER — LEVOTHYROXINE SODIUM 25 MCG PO TABS: 25.0000 ug | ORAL_TABLET | Freq: Every day | ORAL | Status: DC

## 2012-11-26 MED ORDER — LOSARTAN POTASSIUM-HCTZ 100-12.5 MG PO TABS: 1.0000 | ORAL_TABLET | Freq: Every day | ORAL | Status: DC

## 2012-11-26 MED ORDER — HYDROCODONE-ACETAMINOPHEN 5-325 MG PO TABS: ORAL_TABLET | ORAL | Status: DC

## 2012-11-26 MED ORDER — MELOXICAM 15 MG PO TABS: ORAL_TABLET | ORAL | Status: DC

## 2012-11-26 MED ORDER — KETOCONAZOLE 2 % EX CREA: 1.0000 "application " | TOPICAL_CREAM | Freq: Every day | CUTANEOUS | Status: DC

## 2012-11-26 MED ORDER — CYCLOBENZAPRINE HCL 10 MG PO TABS: 10.0000 mg | ORAL_TABLET | Freq: Three times a day (TID) | ORAL | Status: DC | PRN

## 2012-11-30 NOTE — Progress Notes (Signed)
  Subjective:    Patient ID: Cathy Fuller, female    DOB: 1961-03-01, 52 y.o.   MRN: [id number]  HPI 52 yr old here for skin tag removal in L axilla.  I havent seen her since July 2013, so this needs to also be a check up for her ongoing medical conditions.  She is compliant with her medications.  She admits to "getting off track" on her diet during the holidays.  She would like to see if plastic surgery for removal of her pannus would be indicated as she believes the abdominal weight exacerbates her back pain.  We discussed this and healthy diet and increased activity which we discuss at each visit.  Review of Systems  All other systems reviewed and are negative.       Objective:   Physical Exam  Nursing note and vitals reviewed. Constitutional: She is oriented to person, place, and time. She appears well-developed and well-nourished.       obese  HENT:  Head: Normocephalic and atraumatic.  Cardiovascular: Normal rate, regular rhythm and normal heart sounds.   Pulmonary/Chest: Effort normal and breath sounds normal.  Abdominal: Soft. Bowel sounds are normal.  Musculoskeletal: Normal range of motion. She exhibits no edema.       B feet without ulceration or any open sores.  Neurological: She is alert and oriented to person, place, and time.  Skin: Skin is warm and dry.       3 small skin tags in the axilla are prepped with alcohol and betadine preps. 2%xylocaine with epi just to raise a wheal at the base of each skin tag.  Sterile pick ups and iris scissors to clip base. Bandaids.  Patient tolerated well.  Psychiatric: She has a normal mood and affect. Her behavior is normal.          Assessment & Plan:  DM type II-controlled Hypertension-controlled but I still would like to see this a little lower and believe with proper diet and subsequent weight loss, it would improve quickly-but, it is controlled. Hyperlipidemia-checking labs. Continue current meds. 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 Chronic back pain-#30 vicodin usu last 2 months.  It is fine to use this occasionally along with her meloxicam and flexeril. Again at this visit we spoke at length about how imperative healthy diet is.

## 2012-12-03 ENCOUNTER — Ambulatory Visit: Payer: Self-pay | Admitting: Physician Assistant

## 2012-12-03 ENCOUNTER — Encounter: Payer: Self-pay | Admitting: Radiology

## 2013-01-12 DIAGNOSIS — M67919 Unspecified disorder of synovium and tendon, unspecified shoulder: Secondary | ICD-10-CM | POA: Diagnosis not present

## 2013-01-12 DIAGNOSIS — M719 Bursopathy, unspecified: Secondary | ICD-10-CM | POA: Diagnosis not present

## 2013-01-15 DIAGNOSIS — M545 Low back pain, unspecified: Secondary | ICD-10-CM | POA: Diagnosis not present

## 2013-01-23 DIAGNOSIS — M76899 Other specified enthesopathies of unspecified lower limb, excluding foot: Secondary | ICD-10-CM | POA: Diagnosis not present

## 2013-03-30 ENCOUNTER — Telehealth: Payer: Self-pay

## 2013-03-30 NOTE — Telephone Encounter (Signed)
Cathy Fuller  WITH [facility] CALLED STATING THEY ARE CHANGING THE BRAND OF LEVOTHYROXINE (THE PT IS CURRENTLY TAKING 25 MICROGRAMS) TO THE [place]OV BRAND AND HAS REQUESTED A VERBAL OK TO DO SO. SHE CAN BE REACHED AT [phone]. THANKS!

## 2013-03-31 NOTE — Telephone Encounter (Signed)
Called [facility] and advised.

## 2013-03-31 NOTE — Telephone Encounter (Signed)
Ok to switch to new brand.  Let's make sure we recheck her TSH at next visit.  Last visit in Jan 2014, so probably due for visit/labs in July 2014

## 2013-03-31 NOTE — Telephone Encounter (Signed)
Is this ok?

## 2013-04-02 ENCOUNTER — Telehealth: Payer: Self-pay | Admitting: Physician Assistant

## 2013-04-02 NOTE — Telephone Encounter (Signed)
Advised patient. Thank you for speaking to the pharmacy for me.

## 2013-04-02 NOTE — Telephone Encounter (Signed)
Pharmacist called to request authorization to change from current generic levothyroxine to preferred generic levothyroxine.  Previous product can be ordered, but will cost the patient more.  Authorized change, but advised that the patient will need to have thyroid function rechecked in 6 weeks.  In reviewing the chart, I see that she is overdue to follow-up of DM.  Please call the patient to advise her to RTC for follow-up and update of her bloodwork in 6 weeks.

## 2013-05-14 ENCOUNTER — Ambulatory Visit (INDEPENDENT_AMBULATORY_CARE_PROVIDER_SITE_OTHER): Payer: BC Managed Care – PPO | Admitting: Physician Assistant

## 2013-05-14 ENCOUNTER — Encounter: Payer: Self-pay | Admitting: Physician Assistant

## 2013-05-14 VITALS — BP 132/82 | HR 92 | Temp 98.3°F | Resp 16 | Ht 64.5 in | Wt 232.6 lb

## 2013-05-14 DIAGNOSIS — I10 Essential (primary) hypertension: Secondary | ICD-10-CM

## 2013-05-14 DIAGNOSIS — L259 Unspecified contact dermatitis, unspecified cause: Secondary | ICD-10-CM

## 2013-05-14 DIAGNOSIS — E7800 Pure hypercholesterolemia, unspecified: Secondary | ICD-10-CM

## 2013-05-14 DIAGNOSIS — M545 Low back pain, unspecified: Secondary | ICD-10-CM

## 2013-05-14 DIAGNOSIS — E039 Hypothyroidism, unspecified: Secondary | ICD-10-CM

## 2013-05-14 DIAGNOSIS — L309 Dermatitis, unspecified: Secondary | ICD-10-CM

## 2013-05-14 DIAGNOSIS — E119 Type 2 diabetes mellitus without complications: Secondary | ICD-10-CM

## 2013-05-14 LAB — POCT GLYCOSYLATED HEMOGLOBIN (HGB A1C): Hemoglobin A1C: 7.9

## 2013-05-14 LAB — GLUCOSE, POCT (MANUAL RESULT ENTRY): POC Glucose: 115 mg/dl — AB (ref 70–99)

## 2013-05-14 MED ORDER — HYDROCODONE-ACETAMINOPHEN 5-325 MG PO TABS: ORAL_TABLET | ORAL | Status: DC

## 2013-05-14 MED ORDER — KETOCONAZOLE 2 % EX CREA: 1.0000 "application " | TOPICAL_CREAM | Freq: Every day | CUTANEOUS | Status: DC

## 2013-05-14 MED ORDER — LEVOTHYROXINE SODIUM 25 MCG PO TABS: 25.0000 ug | ORAL_TABLET | Freq: Every day | ORAL | Status: DC

## 2013-05-14 MED ORDER — LOSARTAN POTASSIUM-HCTZ 100-12.5 MG PO TABS: 1.0000 | ORAL_TABLET | Freq: Every day | ORAL | Status: DC

## 2013-05-14 NOTE — Progress Notes (Signed)
I have examined this patient along with the student and agree.  Additionally, the patient is interested in starting Belviq for weight loss.  She successfully lost weight with Atkin's diet 5 years ago, and has continued to avoid starches in her diet, and uses many of the recipes she did then.  If her TSH, and CMET are normal, we agree to try Belviq x 8-12 weeks.

## 2013-05-14 NOTE — Patient Instructions (Addendum)
Keep up the good work-be sure to make healthy eating choices and get regular exercise.  I will contact you with your lab results as soon as they are available.   If you have not heard from me in 2 weeks, please contact me.  The fastest way to get your results is to register for My Chart (see the instructions on the last page of this printout).

## 2013-05-14 NOTE — Progress Notes (Signed)
Subjective:    Patient ID: Cathy Fuller, female    DOB: June 23, 1961, 52 y.o.   MRN: [id number]  HPI History of Present Illness Cathy Fuller is a 52 y.o. female presenting for:  1)  Thyroid function testing after changing levothyroxine brands.  2)  6 month follow-up on Diabetes Type II:  The patient does monitor blood sugars at home in the mornings only.  Ranges from 109 - 120.  The patient describes her diet as heavy in meats lately since her son moved back in.  She does exercise more in the summer including walking and gardening.  She has never seen a nutritionist. Experiencing urinary frequency and cramping.  Denies polydipsia, polyphagia, polyuria, numbness, tingling, and myalgias.  3)  She describes experiencing a flash of light in the temporal view of her left eye.  Occurs occasionally for the past 4-5 months.  The patient does not see the eye doctor regularly.  Her last appointment was in 2011.    Past Medical History  Diagnosis Date  . GERD (gastroesophageal reflux disease)   . Ulcer   . Anemia   . Diabetes mellitus without complication   . Thyroid disease   . Blood transfusion without reported diagnosis   . Barrett's esophagus     Managed by Internal Medicine, Dr. Dickie La  . Esophageal stricture     Managed by Internal Medicine, Dr. Dickie La  . Fatty liver 2009    Managed by Internal Medicine, Dr. Dickie La   Prior to Admission medications   Medication Sig Start Date End Date Taking? Authorizing Provider  Chromium-Cinnamon (CINNAMON PLUS CHROMIUM PO) Take 1,000 mg by mouth daily. TAKE 2 CAPS AT NIGHT   Yes Historical Provider, MD  cyclobenzaprine (FLEXERIL) 10 MG tablet Take 1 tablet (10 mg total) by mouth 3 (three) times daily as needed. 11/26/12  Yes Anders Simmonds, PA-C  Glucosamine-Chondroitin (JOINT SUPPORT PO) Take 1,000 mg by mouth 2 (two) times daily before a meal.   Yes Historical Provider, MD  HYDROcodone-acetaminophen (NORCO) 5-325 MG per tablet Patient can take 1  tablet every 6-8 hours at night for pain. 11/26/12  Yes Marzella Schlein McClung, PA-C  ketoconazole (NIZORAL) 2 % cream Apply 1 application topically daily. NEED REFILLS 11/26/12  Yes Marzella Schlein McClung, PA-C  levothyroxine (SYNTHROID, LEVOTHROID) 25 MCG tablet Take 25 mcg by mouth daily. NEED REFILL  90 DAYS -WANT HARD COPY RX 11/26/12  Yes Marzella Schlein McClung, PA-C  losartan-hydrochlorothiazide (HYZAAR) 100-12.5 MG per tablet Take 1 tablet by mouth daily. 11/26/12 11/26/13 Yes Anders Simmonds, PA-C  meloxicam (MOBIC) 15 MG tablet 1/2-1 bid prn pain 11/26/12  Yes Anders Simmonds, PA-C  metFORMIN (GLUCOPHAGE) 1000 MG tablet Take 1,000 mg by mouth 2 (two) times daily with a meal. NEED REFILL  90 DAYS 11/26/12  Yes Anders Simmonds, PA-C  OVER THE COUNTER MEDICATION daily. IODINE PLUS-2   Yes Historical Provider, MD  NONFORMULARY OR COMPOUNDED ITEM Lean essential mult vit packed twice daily    Historical Provider, MD  NONFORMULARY OR COMPOUNDED ITEM Natural cleanse  Fiber take 2 hrs after Rx med    Historical Provider, MD  NONFORMULARY OR COMPOUNDED ITEM Natural Lean Wt. Loss supplement take 30 mins before breakfast and dinner    Historical Provider, MD  NONFORMULARY OR COMPOUNDED ITEM Natural Energy Appetite suppressant take in the am    Historical Provider, MD  NONFORMULARY OR COMPOUNDED ITEM Natural Control Food Cravings take 30 mins before breakfast and lunch  Historical Provider, MD     Allergies  Allergen Reactions  . Aspirin   . Tape Other (See Comments)    Blisters   Family History  Problem Relation Age of Onset  . Thyroid disease Mother   . Stroke Mother   . Stroke Father   . Diabetes Brother   . Colitis Daughter    History   Social History  . Marital Status: Married    Spouse Name: N/A    Number of Children: N/A  . Years of Education: N/A   Occupational History  .      disabled   Social History Main Topics  . Smoking status: Never Smoker   . Smokeless tobacco: Never Used  .  Alcohol Use: .5 - 1 oz/week    1-2 drink(s) per week     Comment: rare  . Drug Use: No  . Sexually Active: Not on file   Other Topics Concern  . Not on file   Social History Narrative   Lives at home with husband and son.     Review of Systems As stated above in HPI - otherwise, negative.      Objective:   Physical Exam  Filed Vitals:   05/14/13 1418  BP: 132/82  Pulse: 92  Temp: 98.3 F (36.8 C)  TempSrc: Oral  Resp: 16  Height: 5' 4.5" (1.638 m)  Weight: 232 lb 9.6 oz (105.507 kg)  SpO2: 96%   General:  Obese female in no acute distress. Cardiovascular:  S1 and S2 are distant. Respiratory:  CTA.  No adventitious sounds. PVS:  No carotid bruits present.  2+ pedal pulses. Skin:  Tinea pedis present bilaterally.  Onychomycosis of the right and left great toes.   Neuro:  Full sensation to monofilament.  See Diabetic Foot Exam for more information.    Results for orders placed in visit on 05/14/13  GLUCOSE, POCT (MANUAL RESULT ENTRY)      Result Value Range   POC Glucose 115 (*) 70 - 99 mg/dl  POCT GLYCOSYLATED HEMOGLOBIN (HGB A1C)      Result Value Range   Hemoglobin A1C 7.9         Assessment & Plan:  1. Type II or unspecified type diabetes mellitus without mention of complication, not stated as uncontrolled Continue working hard on diet and exercise to bring her A1C down.  No change to medications at this time. Obtained: - POCT glucose (manual entry) - POCT glycosylated hemoglobin (Hb A1C) Order: - Microalbumin, urine - Recommended to see ophthalmologist for full evaluation.  2. HTN (hypertension) Continue taking medications as directed. Order: - Comprehensive metabolic panel  3. Hypercholesteremia As stated above, continue healthy eating and regular exercise.   Order: - Lipid panel  4. Hypothyroid Obtain results and will let patient know if adjustment is warranted. Order: - TSH

## 2013-05-15 ENCOUNTER — Encounter: Payer: Self-pay | Admitting: Physician Assistant

## 2013-05-15 LAB — COMPREHENSIVE METABOLIC PANEL
ALT: 81 U/L — ABNORMAL HIGH (ref 0–35)
AST: 84 U/L — ABNORMAL HIGH (ref 0–37)
Albumin: 4.9 g/dL (ref 3.5–5.2)
Calcium: 10.8 mg/dL — ABNORMAL HIGH (ref 8.4–10.5)
Chloride: 101 mEq/L (ref 96–112)
Potassium: 4.3 mEq/L (ref 3.5–5.3)
Sodium: 137 mEq/L (ref 135–145)
Total Protein: 8 g/dL (ref 6.0–8.3)

## 2013-05-15 LAB — LIPID PANEL: Total CHOL/HDL Ratio: 4 Ratio

## 2013-05-15 LAB — MICROALBUMIN, URINE: Microalb, Ur: 2.51 mg/dL — ABNORMAL HIGH (ref 0.00–1.89)

## 2013-05-15 LAB — TSH: TSH: 3.98 u[IU]/mL (ref 0.350–4.500)

## 2013-08-20 ENCOUNTER — Ambulatory Visit: Payer: BC Managed Care – PPO | Admitting: Physician Assistant

## 2013-08-31 ENCOUNTER — Telehealth: Payer: Self-pay

## 2013-08-31 MED ORDER — METFORMIN HCL 1000 MG PO TABS: 1000.0000 mg | ORAL_TABLET | Freq: Two times a day (BID) | ORAL | Status: DC

## 2013-08-31 NOTE — Telephone Encounter (Signed)
PT STATES SHE IS IN [place] AND HAVE RUN OUT OF HER METFORMIN, WOULD LIKE [place] TO CALL HER SOME IN UNTIL SHE GET BACK IN TOWN PLEASE CALL PT AT [phone] AND THE PHARMACY IS [facility] AND THE PHONE NUMBER IS [phone]    [facility] IN [place] AT [phone]

## 2013-08-31 NOTE — Telephone Encounter (Signed)
Where is the pharmacy?

## 2013-10-01 ENCOUNTER — Other Ambulatory Visit: Payer: Self-pay | Admitting: Physician Assistant

## 2013-10-27 ENCOUNTER — Encounter: Payer: Self-pay | Admitting: Physician Assistant

## 2013-10-27 ENCOUNTER — Ambulatory Visit (INDEPENDENT_AMBULATORY_CARE_PROVIDER_SITE_OTHER): Payer: BC Managed Care – PPO | Admitting: Physician Assistant

## 2013-10-27 VITALS — BP 142/90 | HR 76 | Temp 98.0°F | Resp 16 | Ht 64.5 in | Wt 223.0 lb

## 2013-10-27 DIAGNOSIS — I10 Essential (primary) hypertension: Secondary | ICD-10-CM

## 2013-10-27 DIAGNOSIS — Z23 Encounter for immunization: Secondary | ICD-10-CM

## 2013-10-27 DIAGNOSIS — M545 Low back pain, unspecified: Secondary | ICD-10-CM

## 2013-10-27 DIAGNOSIS — Z860100 Personal history of colon polyps, unspecified: Secondary | ICD-10-CM

## 2013-10-27 DIAGNOSIS — E119 Type 2 diabetes mellitus without complications: Secondary | ICD-10-CM

## 2013-10-27 DIAGNOSIS — E039 Hypothyroidism, unspecified: Secondary | ICD-10-CM

## 2013-10-27 DIAGNOSIS — K2270 Barrett's esophagus without dysplasia: Secondary | ICD-10-CM

## 2013-10-27 DIAGNOSIS — G4733 Obstructive sleep apnea (adult) (pediatric): Secondary | ICD-10-CM

## 2013-10-27 HISTORY — DX: Obstructive sleep apnea (adult) (pediatric): G47.33

## 2013-10-27 LAB — LIPID PANEL
Cholesterol: 183 mg/dL (ref 0–200)
HDL: 58 mg/dL (ref >39)
Total CHOL/HDL Ratio: 3.2 Ratio
Triglycerides: 178 mg/dL — ABNORMAL HIGH (ref <150)

## 2013-10-27 LAB — COMPLETE METABOLIC PANEL WITH GFR
Alkaline Phosphatase: 74 U/L (ref 39–117)
CO2: 26 mEq/L (ref 19–32)
Creat: 0.92 mg/dL (ref 0.50–1.10)
GFR, Est African American: 82 mL/min
GFR, Est Non African American: 71 mL/min
Glucose, Bld: 184 mg/dL — ABNORMAL HIGH (ref 70–99)
Sodium: 141 mEq/L (ref 135–145)
Total Bilirubin: 0.4 mg/dL (ref 0.3–1.2)
Total Protein: 7.5 g/dL (ref 6.0–8.3)

## 2013-10-27 MED ORDER — LEVOTHYROXINE SODIUM 25 MCG PO TABS: 25.0000 ug | ORAL_TABLET | Freq: Every day | ORAL | Status: DC

## 2013-10-27 MED ORDER — METFORMIN HCL 1000 MG PO TABS: 1000.0000 mg | ORAL_TABLET | Freq: Two times a day (BID) | ORAL | Status: DC

## 2013-10-27 MED ORDER — MELOXICAM 15 MG PO TABS: ORAL_TABLET | ORAL | Status: DC

## 2013-10-27 MED ORDER — LOSARTAN POTASSIUM-HCTZ 100-12.5 MG PO TABS: 1.0000 | ORAL_TABLET | Freq: Every day | ORAL | Status: DC

## 2013-10-27 MED ORDER — HYDROCODONE-ACETAMINOPHEN 10-325 MG PO TABS: 1.0000 | ORAL_TABLET | Freq: Three times a day (TID) | ORAL | Status: DC | PRN

## 2013-10-27 MED ORDER — CYCLOBENZAPRINE HCL 10 MG PO TABS: 10.0000 mg | ORAL_TABLET | Freq: Three times a day (TID) | ORAL | Status: DC | PRN

## 2013-10-27 NOTE — Patient Instructions (Signed)
It's important to work hard on healthy eating and regular exercise. If your A1C isn't improved at your next visit, we'll need to add another medication for your blood sugar. It's VERY important that you go for the sleep study and get scheduled with Dr. Juanda Chance for follow-up of your esophagus and colonoscopy.

## 2013-10-27 NOTE — Progress Notes (Signed)
Subjective:    Patient ID: Cathy Fuller, female    DOB: 12-31-1960, 52 y.o.   MRN: [id number]  PCP: No primary provider on file.  Chief Complaint  Patient presents with  . Needs form completed  . Medication Refill    metformin, losartan, levothyroxine,hydrocodone, cyclobenzaprine, nizoral cream, meloxicam  . Diabetes   Medications, allergies, past medical history, surgical history, family history, social history and problem list reviewed and updated.  HPI Last seen in 05/2013.  Frequency of home glucose monitoring: irregularly; August-October was out of town and out of medications, readings were 300-400's Doesn't a dentist Q6 months, eye specialist annually (had a dilated eye exam in [place] 07/2013). Checks feet daily. Is not current with influenza vaccine. Is not current with pneumococcal vaccine.  Due for tetanus/Tdap Last colonoscopy in about 2010. Reports a history of OSA, but never had titration or CPAP setup.  Review of Systems Swallowing problems, food getting stuck.  Needs to follow-up with GI, but only has one car, and so scheduling is more difficult.    Objective:   Physical Exam Blood pressure 142/90, pulse 76, temperature 98 F (36.7 C), temperature source Oral, resp. rate 16, height 5' 4.5" (1.638 m), weight 223 lb (101.152 kg), SpO2 97.00%. Body mass index is 37.7 kg/(m^2). Well-developed, well nourished WF who is awake, alert and oriented, in NAD. HEENT: [place]/AT, sclera and conjunctiva are clear.   Neck: supple, non-tender, no lymphadenopathy, thyromegaly. Heart: RRR, no murmur Lungs: normal effort, CTA Extremities: no cyanosis, clubbing or edema. See DM foot exam. Skin: warm and dry without rash. Psychologic: good mood and appropriate affect, normal speech and behavior.   Results for orders placed in visit on 10/27/13  GLUCOSE, POCT (MANUAL RESULT ENTRY)      Result Value Range   POC Glucose 194 (*) 70 - 99 mg/dl  POCT GLYCOSYLATED HEMOGLOBIN (HGB  A1C)      Result Value Range   Hemoglobin A1C 8.9     Up from 7.9     Assessment & Plan:  1. Hypertension Nearly to goal. Continue current treatment. - COMPLETE METABOLIC PANEL WITH GFR - Lipid panel - losartan-hydrochlorothiazide (HYZAAR) 100-12.5 MG per tablet; Take 1 tablet by mouth daily.  Dispense: 90 tablet; Refill: 3  2. DM Loss of control, likely due to medication non-compliance while in [place].  Counselled on healthy eating, regular exercise, weight loss.  If not improved in 12 weeks, will ADD another agent. - POCT glucose (manual entry) - POCT glycosylated hemoglobin (Hb A1C) - HM Diabetes Foot Exam - metFORMIN (GLUCOPHAGE) 1000 MG tablet; Take 1 tablet (1,000 mg total) by mouth 2 (two) times daily with a meal.  Dispense: 180 tablet; Refill: 1 - Microalbumin, urine  3. Hypothyroidism Continue current treatment and update labs - TSH - levothyroxine (SYNTHROID, LEVOTHROID) 25 MCG tablet; Take 1 tablet (25 mcg total) by mouth daily. NEED REFILL  90 DAYS -WANT HARD COPY RX  Dispense: 90 tablet; Refill: 3  4. Low back pain Chronic/stable.  Continue current treatment - cyclobenzaprine (FLEXERIL) 10 MG tablet; Take 1 tablet (10 mg total) by mouth 3 (three) times daily as needed.  Dispense: 90 tablet; Refill: 1 - HYDROcodone-acetaminophen (NORCO) 10-325 MG per tablet; Take 1 tablet by mouth every 8 (eight) hours as needed.  Dispense: 90 tablet; Refill: 0 - meloxicam (MOBIC) 15 MG tablet; 1/2 bid, or 1 QD, prn pain  Dispense: 90 tablet; Refill: 3  5. OSA (obstructive sleep apnea) Never had CPAP titration. - Ambulatory  referral to Sleep Studies  6. Need for influenza vaccination - Flu Vaccine QUAD 36+ mos IM  7. Need for pneumococcal vaccination - Pneumococcal polysaccharide vaccine 23-valent greater than or equal to 2yo subcutaneous/IM  8. Need for Tdap vaccination - Tdap vaccine greater than or equal to 7yo IM  9. BARRETTS ESOPHAGUS Concerning that she takes  meloxicam regularly for back pain.  Needs re-evaluation with GI. - Ambulatory referral to Gastroenterology  10. COLONIC POLYPS, ADENOMATOUS, HX OF Due for repeat colonoscopy. - Ambulatory referral to Gastroenterology  Return in about 3 months (around 01/25/2014) for re-evaluation of diabetes.  Fernande Bras, PA-C Physician Assistant-Certified [facility]

## 2013-10-28 LAB — MICROALBUMIN, URINE: Microalb, Ur: 4.05 mg/dL — ABNORMAL HIGH (ref 0.00–1.89)

## 2013-10-29 ENCOUNTER — Encounter: Payer: Self-pay | Admitting: Physician Assistant

## 2013-10-29 ENCOUNTER — Encounter: Payer: Self-pay | Admitting: Neurology

## 2013-10-29 ENCOUNTER — Ambulatory Visit (INDEPENDENT_AMBULATORY_CARE_PROVIDER_SITE_OTHER): Payer: BC Managed Care – PPO | Admitting: Neurology

## 2013-10-29 VITALS — BP 127/85 | HR 78 | Temp 97.8°F | Ht 64.5 in | Wt 225.0 lb

## 2013-10-29 DIAGNOSIS — G4733 Obstructive sleep apnea (adult) (pediatric): Secondary | ICD-10-CM | POA: Diagnosis not present

## 2013-10-29 DIAGNOSIS — E669 Obesity, unspecified: Secondary | ICD-10-CM | POA: Diagnosis not present

## 2013-10-29 DIAGNOSIS — I10 Essential (primary) hypertension: Secondary | ICD-10-CM

## 2013-10-29 DIAGNOSIS — E119 Type 2 diabetes mellitus without complications: Secondary | ICD-10-CM

## 2013-10-29 NOTE — Patient Instructions (Addendum)

## 2013-10-29 NOTE — Progress Notes (Signed)
Subjective:    Patient ID: Cathy Fuller is a 52 y.o. female.  HPI    Cathy Foley, MD, PhD [facility] [address], [address] [address] [place], [place] [address]  Dear Avelino Leeds,   I saw your patient, Cathy Fuller, upon your kind request in my neurologic clinic today for initial consultation of her sleep disorder, in particular concern for obstructive sleep apnea. The patient is unaccompanied today. As you know, Cathy Fuller is a 52 year old right-handed woman with an underlying medical history of hypertension, migraine headaches since age 52 (have improved with time), hypothyroidism, obesity, type 2 diabetes, shoulder surgeries, and chronic low back pain on narcotic pain medication (prn norco, not on a daily basis), s/p back surgery in who was diagnosed with obstructive sleep apnea in 2001. However, for some reason she never received treatment for this. She reports having had a baseline sleep study and a CPAP titration study at the time. She had 2 sleep studies, in July 2001 and September 2001. She has snoring and her husband reports witnessed apneas, and she has occasional gasping sensations, that wake her up and she has occasional middle of the night headaches or morning headaches. She used to work third shift and is on disability. Her husband works second shift and they go to bed late, around 2 AM, and she falls asleep usually within 15 minutes and she wakes up between 7 and 7:30 AM. She has pain in her back at night and may take Flexeril and norco at night. She then may wake up groggy. Otherwise, she wakes up relatively rested. She wakes up once per night and goes to the bathroom once per night. She sleeps on her sides or back or stomach. She has gained weight since her sleep studies, in the realm of 40+ lb.   She reports excessive daytime somnolence (EDS) and Her Epworth Sleepiness Score (ESS) is 9/24 today. She has not fallen asleep while driving. The patient has not been  taking a planned nap, but may fall asleep in the afternoon at times.  She has been known to snore for the past many years. Snoring is reportedly moderate, and associated with choking sounds and witnessed apneas. The patient admits to a sense of choking or strangling feeling. There is report of nighttime reflux for which she takes OTC famotide, with occasional nighttime cough experienced. The patient has not noted any RLS symptoms and is not known to kick while asleep or before falling asleep. There is no family history of RLS or OSA.  She is a restless sleeper and in the morning, the bed is quite disheveled.   She denies cataplexy, sleep paralysis, hypnagogic or hypnopompic hallucinations, or sleep attacks. She does not report any vivid dreams, nightmares, dream enactments, or parasomnias, such as sleep talking or sleep walking.   She consumes 3 caffeinated beverages per day, usually in the form of tea and of soda.   Her bedroom is usually dark and cool. There is no TV in the bedroom.   Her Past Medical History Is Significant For: Past Medical History  Diagnosis Date  . GERD (gastroesophageal reflux disease)   . Ulcer   . Anemia   . Diabetes mellitus without complication   . Thyroid disease   . Blood transfusion without reported diagnosis   . Barrett's esophagus     Managed by Internal Medicine, Dr. Dickie La  . Esophageal stricture     Managed by Internal Medicine, Dr. Dickie La  . Fatty liver 2009  Managed by Internal Medicine, Dr. Dickie La    Her Past Surgical History Is Significant For: Past Surgical History  Procedure Laterality Date  . Breast surgery    . Cholecystectomy    . Cholecystectomy    . Cholecystectomy      aprox 1997/1998  . Cholecystectomy, laparoscopic  1998  . Abdominal hysterectomy    . Knee surgery    . Tonsillectomy      Her Family History Is Significant For: Family History  Problem Relation Age of Onset  . Thyroid disease Mother   . Stroke Mother   . Stroke  Father   . Diabetes Brother   . Colitis Daughter     Her Social History Is Significant For: History   Social History  . Marital Status: Married    Spouse Name: N/A    Number of Children: N/A  . Years of Education: N/A   Occupational History  .      disabled   Social History Main Topics  . Smoking status: Never Smoker   . Smokeless tobacco: Never Used  . Alcohol Use: .5 - 1 oz/week    1-2 drink(s) per week     Comment: rare  . Drug Use: No  . Sexual Activity: None   Other Topics Concern  . None   Social History Narrative   Lives at home with husband and son.      Her Allergies Are:  Allergies  Allergen Reactions  . Aspirin   . Tape Other (See Comments)    Blisters  :   Her Current Medications Are:  Outpatient Encounter Prescriptions as of 10/29/2013  Medication Sig  . Chromium-Cinnamon (CINNAMON PLUS CHROMIUM PO) Take 1,000 mg by mouth daily. TAKE 2 CAPS AT NIGHT  . cyclobenzaprine (FLEXERIL) 10 MG tablet Take 1 tablet (10 mg total) by mouth 3 (three) times daily as needed.  . Glucosamine-Chondroitin (JOINT SUPPORT PO) Take 1,000 mg by mouth 2 (two) times daily before a meal.  . HYDROcodone-acetaminophen (NORCO) 10-325 MG per tablet Take 1 tablet by mouth every 8 (eight) hours as needed.  Marland Kitchen ketoconazole (NIZORAL) 2 % cream Apply 1 application topically daily.  Marland Kitchen levothyroxine (SYNTHROID, LEVOTHROID) 25 MCG tablet Take 1 tablet (25 mcg total) by mouth daily. NEED REFILL  90 DAYS -WANT HARD COPY RX  . losartan-hydrochlorothiazide (HYZAAR) 100-12.5 MG per tablet Take 1 tablet by mouth daily.  . meloxicam (MOBIC) 15 MG tablet 1/2 bid, or 1 QD, prn pain  . metFORMIN (GLUCOPHAGE) 1000 MG tablet TAKE ONE TABLET BY MOUTH TWICE DAILY WITH A MEAL  . metFORMIN (GLUCOPHAGE) 1000 MG tablet Take 1 tablet (1,000 mg total) by mouth 2 (two) times daily with a meal.   Review of Systems:  Out of a complete 14 point review of systems, all are reviewed and negative with the  exception of these symptoms as listed below:   Review of Systems  Constitutional: Positive for fatigue and unexpected weight change.  HENT: Positive for trouble swallowing.   Eyes: Negative.   Respiratory: Negative.   Cardiovascular: Negative.   Gastrointestinal: Positive for diarrhea and constipation.  Endocrine: Negative.   Genitourinary: Negative.   Musculoskeletal: Positive for myalgias.  Skin: Negative.   Allergic/Immunologic: Negative.   Neurological: Positive for headaches.       Memory loss  Hematological: Negative.   Psychiatric/Behavioral: Negative.     Objective:  Neurologic Exam  Physical Exam Physical Examination:   Filed Vitals:   10/29/13 0907  BP: 127/85  Pulse: 78  Temp: 97.8 F (36.6 C)    General Examination: The patient is a very pleasant 52 y.o. female in no acute distress. She appears well-developed and well-nourished and well groomed.   HEENT: Normocephalic, atraumatic, pupils are equal, round and reactive to light and accommodation. Funduscopic exam is normal with sharp disc margins noted. Extraocular tracking is good without limitation to gaze excursion or nystagmus noted. Normal smooth pursuit is noted. Hearing is grossly intact. Tympanic membranes are clear bilaterally. Face is symmetric with normal facial animation and normal facial sensation. Speech is clear with no dysarthria noted. There is no hypophonia. There is no lip, neck/head, jaw or voice tremor. Neck is supple with full range of passive and active motion. There are no carotid bruits on auscultation. Oropharynx exam reveals: mild mouth dryness, adequate dental hygiene and mild airway crowding, due to floppy soft palate and elongated tongue. Mallampati is class I. Tongue protrudes centrally and palate elevates symmetrically. Tonsils are absent. Neck size is 15 7/8 inches.   Chest: Clear to auscultation without wheezing, rhonchi or crackles noted.  Heart: S1+S2+0, regular and normal without  murmurs, rubs or gallops noted.   Abdomen: Soft, non-tender and non-distended with normal bowel sounds appreciated on auscultation.  Extremities: There is trace pitting edema in the distal lower extremities bilaterally. Pedal pulses are intact.  Skin: Warm and dry without trophic changes noted. There are no varicose veins.  Musculoskeletal: exam reveals no obvious joint deformities, tenderness or joint swelling or erythema.   Neurologically:  Mental status: The patient is awake, alert and oriented in all 4 spheres. Her memory, attention, language and knowledge are appropriate. There is no aphasia, agnosia, apraxia or anomia. Speech is clear with normal prosody and enunciation. Thought process is linear. Mood is congruent and affect is normal.  Cranial nerves are as described above under HEENT exam. In addition, shoulder shrug is normal with equal shoulder height noted. Motor exam: Normal bulk, strength and tone is noted. There is no drift, tremor or rebound. Romberg is negative. Reflexes are 1+ throughout. Toes are downgoing bilaterally. Fine motor skills are intact with normal finger taps, normal hand movements, normal rapid alternating patting, normal foot taps and normal foot agility.  Cerebellar testing shows no dysmetria or intention tremor on finger to nose testing. Heel to shin is unremarkable bilaterally. There is no truncal or gait ataxia.  Sensory exam is intact to light touch, pinprick, vibration, temperature sense in the upper and lower extremities.  Gait, station and balance are unremarkable. No veering to one side is noted. No leaning to one side is noted. Posture is age-appropriate and stance is narrow based. No problems turning are noted. She turns en bloc. Tandem walk is unremarkable. Intact toe and heel stance is noted.               Assessment and Plan:   In summary, Cathy Fuller is a very pleasant 52 y.o.-year old female with a history and physical exam concerning for  obstructive sleep apnea (OSA). She was not treated for OSA and diagnosed previously with OSA. She has had in the interim quite a bit of weight gain. She states that she was up to 245 pounds at one point and was able to lose with the help of a weight loss clinic as well as being on an Atkins diet, she was down to 195 pounds at one point but currently is around 225 pounds. I had a long chat with the patient about my  findings and the diagnosis, its prognosis and treatment options. We talked about medical treatments and non-pharmacological approaches. I explained in particular the risks and ramifications of untreated moderate to severe OSA, especially with respect to developing cardiovascular disease down the Road, including congestive heart failure, difficult to treat hypertension, cardiac arrhythmias, or stroke. Even type 2 diabetes has in part been linked to untreated OSA. We talked about smoking cessation and trying to maintain a healthy lifestyle in general, as well as the importance of weight control. I encouraged the patient to eat healthy, exercise daily and keep well hydrated, to keep a scheduled bedtime and wake time routine, to not skip any meals and eat healthy snacks in between meals.  I recommended the following at this time: sleep study with potential positive airway pressure titration.  I explained the sleep test procedure to the patient and also outlined possible surgical and non-surgical treatment options of OSA, including the use of a custom-made dental device, upper airway surgical options, such as pillar implants, radiofrequency surgery, tongue base surgery, and UPPP. I also explained the CPAP treatment option to the patient, who indicated that she would be willing to try CPAP if the need arises. I explained the importance of being compliant with PAP treatment, not only for insurance purposes but primarily to improve Her symptoms, and for the patient's long term health benefit, including to  reduce Her cardiovascular risks. I answered all her questions today and the patient was in agreement. I would like to see her back after the sleep study is completed and encouraged her to call with any interim questions, concerns, problems or updates.   Thank you very much for allowing me to participate in the care of this nice patient. If I can be of any further assistance to you please do not hesitate to call me at [phone].  Sincerely,   Cathy Foley, MD, PhD

## 2013-10-31 ENCOUNTER — Ambulatory Visit (INDEPENDENT_AMBULATORY_CARE_PROVIDER_SITE_OTHER): Payer: BC Managed Care – PPO | Admitting: Neurology

## 2013-10-31 DIAGNOSIS — I10 Essential (primary) hypertension: Secondary | ICD-10-CM

## 2013-10-31 DIAGNOSIS — E669 Obesity, unspecified: Secondary | ICD-10-CM

## 2013-10-31 DIAGNOSIS — G4761 Periodic limb movement disorder: Secondary | ICD-10-CM | POA: Diagnosis not present

## 2013-10-31 DIAGNOSIS — G4733 Obstructive sleep apnea (adult) (pediatric): Secondary | ICD-10-CM

## 2013-10-31 DIAGNOSIS — G479 Sleep disorder, unspecified: Secondary | ICD-10-CM

## 2013-10-31 DIAGNOSIS — IMO0002 Reserved for concepts with insufficient information to code with codable children: Secondary | ICD-10-CM | POA: Diagnosis not present

## 2013-10-31 DIAGNOSIS — E119 Type 2 diabetes mellitus without complications: Secondary | ICD-10-CM

## 2013-11-09 NOTE — Sleep Study (Signed)
See media tab for full report  DUE TO INSUFFICIENT AHI, PATIENT WAS NOT SPLIT AS ORDERED BUT A DIAGNOSTIC POLYSOMNOGRAM TOOK PLACE.

## 2013-11-18 ENCOUNTER — Telehealth: Payer: Self-pay | Admitting: Neurology

## 2013-11-18 DIAGNOSIS — G4733 Obstructive sleep apnea (adult) (pediatric): Secondary | ICD-10-CM

## 2013-11-18 DIAGNOSIS — G4734 Idiopathic sleep related nonobstructive alveolar hypoventilation: Secondary | ICD-10-CM

## 2013-11-18 NOTE — Telephone Encounter (Signed)
Please call and notify the patient that the recent sleep study did confirm the diagnosis of obstructive sleep apnea and that I recommend treatment for this in the form of CPAP. This will require a repeat sleep study for proper titration and mask fitting. Please explain to patient and arrange for a CPAP titration study. I have placed an order in the chart. Thanks, Afnan Emberton, MD, PhD [facility]  

## 2013-11-19 ENCOUNTER — Encounter: Payer: Self-pay | Admitting: *Deleted

## 2013-11-19 NOTE — Telephone Encounter (Signed)
I called and spoke with the patient about her recent sleep study results. I informed the patient that the study confirmed the diagnosis of obstructive sleep apnea and Dr. Frances FurbishAthar recommends CPAP. Patient understood that she needs to come back into the lab for the overnight CPAP titration. I will mail a copy of the study to the patient and fax a copy of the report to Porfirio Oarhelle Jeffery, PA-C

## 2013-12-02 ENCOUNTER — Encounter: Payer: Self-pay | Admitting: Physician Assistant

## 2013-12-04 ENCOUNTER — Telehealth: Payer: Self-pay | Admitting: Family Medicine

## 2013-12-04 NOTE — Telephone Encounter (Signed)
Left message to return call. Received letter from [facility] that they have been unable to reach patient to schedule appt. Please have patient call them to schedule [phone]

## 2013-12-07 ENCOUNTER — Encounter: Payer: Self-pay | Admitting: Internal Medicine

## 2013-12-07 NOTE — Telephone Encounter (Signed)
Pt called back..told her [facility] had been trying to reach her. Gave them her phone # and she said she will call them.

## 2013-12-07 NOTE — Telephone Encounter (Signed)
lmom to cb. 

## 2013-12-08 ENCOUNTER — Encounter: Payer: Self-pay | Admitting: *Deleted

## 2013-12-08 ENCOUNTER — Ambulatory Visit (INDEPENDENT_AMBULATORY_CARE_PROVIDER_SITE_OTHER): Payer: BC Managed Care – PPO

## 2013-12-08 DIAGNOSIS — G479 Sleep disorder, unspecified: Secondary | ICD-10-CM | POA: Diagnosis not present

## 2013-12-08 DIAGNOSIS — G4734 Idiopathic sleep related nonobstructive alveolar hypoventilation: Secondary | ICD-10-CM

## 2013-12-08 DIAGNOSIS — G4733 Obstructive sleep apnea (adult) (pediatric): Secondary | ICD-10-CM | POA: Diagnosis not present

## 2013-12-11 ENCOUNTER — Telehealth: Payer: Self-pay | Admitting: Neurology

## 2013-12-11 DIAGNOSIS — G4733 Obstructive sleep apnea (adult) (pediatric): Secondary | ICD-10-CM

## 2013-12-11 NOTE — Telephone Encounter (Signed)
Please call and inform patient that I have entered an order for treatment with PAP. She did well during the latest sleep study with CPAP. We will, therefore, arrange for a machine for home use through a DME (durable medical equipment) company of Her choice; and I will see the patient back in follow-up in about 6 weeks. Please also explain to the patient that I will be looking out for compliance data downloaded from the machine, which can be done remotely through a modem at times or stored on an SD card in the back of the machine. At the time of the followup appointment we will discuss sleep study results and how it is going with PAP treatment at home. Please advise patient to bring Her machine at the time of the visit; at least for the first visit, even though this is cumbersome. Bringing the machine for every visit after that may not be needed, but often helps for the first visit. Please also make sure, the patient has a follow-up appointment with me in about 6 weeks from the setup date, thanks.   Dametrius Sanjuan, MD, PhD [facility]  

## 2013-12-14 ENCOUNTER — Encounter: Payer: Self-pay | Admitting: *Deleted

## 2013-12-14 NOTE — Telephone Encounter (Signed)
I called and left a message for the patient about her recent CPAP Titration study results. I informed the patient that she did well on CPAP during the night of her study and that Dr. Frances FurbishAthar recommends CPAP therapy at home. I will send a copy of the report to [name] Jeffreys's office and mail a copy to the patient along with a follow up instruction letter.

## 2014-01-25 ENCOUNTER — Encounter: Payer: Self-pay | Admitting: Neurology

## 2014-01-25 NOTE — Progress Notes (Signed)
Quick Note:  I reviewed the patient's CPAP compliance data from 12/25/2013 to 01/23/2014, which is a total of 30 days, during which time the patient used CPAP every day except for 2 days. The average usage for all days was 6 hours and 10 minutes. The percent used days greater than 4 hours was 93 %, indicating very good compliance. The residual AHI was 0.8 per hour, indicating an appropriate treatment pressure of 7 cwp with EPR of 3. I will review this data with the patient at the next office visit, provide feedback and additional troubleshooting if need be. She is currently scheduled to for followup with me on 02/15/2014 at 9 AM.  Huston FoleySaima Macklyn Glandon, MD, PhD [facility]   ______

## 2014-01-27 ENCOUNTER — Encounter: Payer: Self-pay | Admitting: Neurology

## 2014-01-28 ENCOUNTER — Ambulatory Visit (INDEPENDENT_AMBULATORY_CARE_PROVIDER_SITE_OTHER): Payer: BC Managed Care – PPO | Admitting: Internal Medicine

## 2014-01-28 ENCOUNTER — Encounter: Payer: Self-pay | Admitting: Internal Medicine

## 2014-01-28 VITALS — BP 110/80 | HR 76 | Ht 64.5 in | Wt 227.8 lb

## 2014-01-28 DIAGNOSIS — K7689 Other specified diseases of liver: Secondary | ICD-10-CM

## 2014-01-28 DIAGNOSIS — K219 Gastro-esophageal reflux disease without esophagitis: Secondary | ICD-10-CM

## 2014-01-28 DIAGNOSIS — K760 Fatty (change of) liver, not elsewhere classified: Secondary | ICD-10-CM

## 2014-01-28 DIAGNOSIS — R1319 Other dysphagia: Secondary | ICD-10-CM

## 2014-01-28 NOTE — Progress Notes (Signed)
Cathy Fuller 17-Feb-1961 [id number]  Note: This dictation was prepared with Dragon digital system. Any transcriptional errors that result from this procedure are unintentional.   History of Present Illness:  This is a 53 year old white female with progressive solid food dysphagia and heartburn. She has a nocturnal cough and hoarseness. We have seen her in the past for a colorectal screening and also for gastroesophageal reflux. She was diagnosed with Barrett's esophagus [place] 2000 and [place]  when she had an esophageal stricture dilated with 15, 16 and 17 mm Savary dilators. Her last endoscopy in July 2011 showed H. pylori negative gastritis and mildly inflamed gastroesophageal junction without evidence of Barrett's esophagus. She is currently on Pepcid 10 mg in the morning and 20 mg at bedtime. She had a liver biopsy several years ago for fatty liver which confirmed steatohepatitis. She has been trying to lose weight but has not been able to. Her last colonoscopy in July 2011 showed internal hemorrhoids. A prior colonoscopy in 2003 showed a hyperplastic polyp.    Past Medical History  Diagnosis Date  . GERD (gastroesophageal reflux disease)   . PUD (peptic ulcer disease)   . Anemia   . Diabetes mellitus without complication   . Thyroid disease   . Blood transfusion without reported diagnosis   . Barrett's esophagus   . Esophageal stricture   . Fatty liver 2009  . Internal hemorrhoids   . NASH (nonalcoholic steatohepatitis)     grade 1 fibrosis  . Hx of adenomatous colonic polyps   . Hyperplastic colon polyp   . IBS (irritable bowel syndrome)   . Hepatitis A 1983  . Hypertension   . Hyperlipidemia   . Sleep apnea     Past Surgical History  Procedure Laterality Date  . Breast biopsy    . Hernia repair    . Carpal tunnel release Bilateral   . Back surgery    . Cholecystectomy, laparoscopic  1998  . Abdominal hysterectomy    . Knee surgery Left   . Shoulder  surgery Left   . Bladder surgery      x 2    Allergies  Allergen Reactions  . Aspirin   . Tape Other (See Comments)    Blisters    Family history and social history have been reviewed.  Review of Systems:   The remainder of the 10 point ROS is negative except as outlined in the H&P  Physical Exam: General Appearance Well developed, in no distress Eyes  Non icteric  HEENT  Non traumatic, normocephalic  Mouth No lesion, tongue papillated, no cheilosis Neck Supple without adenopathy, thyroid not enlarged, no carotid bruits, no JVD Lungs Clear to auscultation bilaterally COR Normal S1, normal S2, regular rhythm, no murmur, quiet precordium Abdomen obese soft nontender. No ascites Rectal not done Extremities  No pedal edema Skin No lesions Neurological Alert and oriented x 3 Psychological Normal mood and affect  Assessment and Plan:   Problem #1 Gastroesophageal reflux disease and past history of Barrett's esophagus not confirmed on her last endoscopy in 2011. She is having progressive solid food dysphagia suggestive of esophageal stricture. We will go ahead with an upper endoscopy and esophageal dilation. She will likely need stronger acid suppression. I have given her samples of Nexium 40 mg daily. She will have to find out from her insurance the preferred PPI.  Problem #2 Fatty liver confirmed on liver biopsy. Patient is trying to lose weight. Her liver function tests have been  monitored by Dr. Tinnie GensJeffrey.  Problem #3 Colorectal screening. Patient has a history of hyperplastic polyps. His last colonoscopy was in July 2011. A recall colonoscopy will be due in July 2021.    Cathy Fuller 01/28/2014

## 2014-01-28 NOTE — Patient Instructions (Signed)
You have been scheduled for an endoscopy with propofol. Please follow written instructions given to you at your visit today. If you use inhalers (even only as needed), please bring them with you on the day of your procedure. Your physician has requested that you go to www.startemmi.com and enter the access code given to you at your visit today. This web site gives a general overview about your procedure. However, you should still follow specific instructions given to you by our office regarding your preparation for the procedure.  We have given you samples of the following medication to take: Nexium 40 mg at night  NF:[id number]:Cathy Leotis ShamesJeffery, PA-C

## 2014-01-29 ENCOUNTER — Encounter: Payer: Self-pay | Admitting: Internal Medicine

## 2014-02-02 ENCOUNTER — Ambulatory Visit (INDEPENDENT_AMBULATORY_CARE_PROVIDER_SITE_OTHER): Payer: BC Managed Care – PPO | Admitting: Physician Assistant

## 2014-02-02 ENCOUNTER — Encounter: Payer: Self-pay | Admitting: Physician Assistant

## 2014-02-02 VITALS — BP 120/76 | HR 81 | Temp 98.6°F | Resp 16 | Ht 64.5 in | Wt 225.2 lb

## 2014-02-02 DIAGNOSIS — IMO0001 Reserved for inherently not codable concepts without codable children: Secondary | ICD-10-CM

## 2014-02-02 DIAGNOSIS — K7689 Other specified diseases of liver: Secondary | ICD-10-CM | POA: Diagnosis not present

## 2014-02-02 DIAGNOSIS — L309 Dermatitis, unspecified: Secondary | ICD-10-CM

## 2014-02-02 DIAGNOSIS — E039 Hypothyroidism, unspecified: Secondary | ICD-10-CM | POA: Diagnosis not present

## 2014-02-02 DIAGNOSIS — E119 Type 2 diabetes mellitus without complications: Secondary | ICD-10-CM

## 2014-02-02 DIAGNOSIS — M545 Low back pain, unspecified: Secondary | ICD-10-CM

## 2014-02-02 DIAGNOSIS — G4733 Obstructive sleep apnea (adult) (pediatric): Secondary | ICD-10-CM

## 2014-02-02 DIAGNOSIS — I10 Essential (primary) hypertension: Secondary | ICD-10-CM

## 2014-02-02 DIAGNOSIS — L259 Unspecified contact dermatitis, unspecified cause: Secondary | ICD-10-CM

## 2014-02-02 DIAGNOSIS — E1165 Type 2 diabetes mellitus with hyperglycemia: Secondary | ICD-10-CM

## 2014-02-02 LAB — COMPLETE METABOLIC PANEL WITH GFR
ALK PHOS: 69 U/L (ref 39–117)
ALT: 79 U/L — AB (ref 0–35)
AST: 99 U/L — ABNORMAL HIGH (ref 0–37)
Albumin: 4.3 g/dL (ref 3.5–5.2)
BILIRUBIN TOTAL: 0.5 mg/dL (ref 0.2–1.2)
BUN: 19 mg/dL (ref 6–23)
CO2: 26 mEq/L (ref 19–32)
Calcium: 10.2 mg/dL (ref 8.4–10.5)
Chloride: 102 mEq/L (ref 96–112)
Creat: 1.06 mg/dL (ref 0.50–1.10)
GFR, EST NON AFRICAN AMERICAN: 60 mL/min
GFR, Est African American: 69 mL/min
Glucose, Bld: 206 mg/dL — ABNORMAL HIGH (ref 70–99)
Potassium: 4.6 mEq/L (ref 3.5–5.3)
SODIUM: 141 meq/L (ref 135–145)
Total Protein: 7.5 g/dL (ref 6.0–8.3)

## 2014-02-02 LAB — GLUCOSE, POCT (MANUAL RESULT ENTRY): POC Glucose: 211 mg/dl — AB (ref 70–99)

## 2014-02-02 LAB — LIPID PANEL
CHOL/HDL RATIO: 3.6 ratio
Cholesterol: 181 mg/dL (ref 0–200)
HDL: 50 mg/dL (ref >39)
LDL Cholesterol: 77 mg/dL (ref 0–99)
Triglycerides: 272 mg/dL — ABNORMAL HIGH (ref <150)
VLDL: 54 mg/dL — ABNORMAL HIGH (ref 0–40)

## 2014-02-02 LAB — POCT GLYCOSYLATED HEMOGLOBIN (HGB A1C): HEMOGLOBIN A1C: 9.5

## 2014-02-02 MED ORDER — LEVOTHYROXINE SODIUM 25 MCG PO TABS: 25.0000 ug | ORAL_TABLET | Freq: Every day | ORAL | Status: DC

## 2014-02-02 MED ORDER — HYDROCODONE-ACETAMINOPHEN 10-325 MG PO TABS
1.0000 | ORAL_TABLET | Freq: Three times a day (TID) | ORAL | Status: DC | PRN
Start: 2014-02-02 — End: 2014-05-04

## 2014-02-02 MED ORDER — MELOXICAM 15 MG PO TABS: ORAL_TABLET | ORAL | Status: DC

## 2014-02-02 MED ORDER — SAXAGLIPTIN HCL 5 MG PO TABS: 5.0000 mg | ORAL_TABLET | Freq: Every day | ORAL | Status: DC

## 2014-02-02 MED ORDER — METFORMIN HCL 1000 MG PO TABS: 1000.0000 mg | ORAL_TABLET | Freq: Two times a day (BID) | ORAL | Status: DC

## 2014-02-02 MED ORDER — LOSARTAN POTASSIUM-HCTZ 100-12.5 MG PO TABS: 1.0000 | ORAL_TABLET | Freq: Every day | ORAL | Status: DC

## 2014-02-02 MED ORDER — KETOCONAZOLE 2 % EX CREA: 1.0000 "application " | TOPICAL_CREAM | Freq: Every day | CUTANEOUS | Status: DC

## 2014-02-02 NOTE — Progress Notes (Signed)
Subjective:    Patient ID: Cathy SquibbJanelle Costilow, female    DOB: 15-Jan-1961, 53 y.o.   MRN: [id number]  Diabetes   Patient presents for diabetes follow and medication refill.  She reports she is having issues keeping her sugar down and she is thinking about going back on the Atkins diet which worked for her in the past.  She is not checking her FSBS levels regularly but states she knows she needs to check it more regularly.  BP at home has been around 124/68.  She had one episode when it was 90/60 and she was feeling tired.    Medications, allergies, past medical history, surgical history, family history, social history and problem list reviewed and updated.  Dental follow up on with problems.  Last exam 4 years ago.  Checks feet daily.  Last eye exam fall 2014 with dilation.  Recent sleep study.  Started CPAP about one month ago and she is using this nightly.  She reports an improvement in sleep quality and quantity.  She had a GI follow up recently for trouble swallowing/food getting stuck.  She has a scope scheduled for 02/09/14.   Review of Systems  Constitutional: Positive for unexpected weight change (weight gain).  HENT: Negative.   Eyes: Positive for visual disturbance (new rx this past fall but vision has changed).  Respiratory: Positive for cough (chronic, dry usually but sometimes productive, has mucus in throat; discussed with GI and she has a scope scheduled for 02/09/14).   Cardiovascular: Positive for leg swelling (BLE edema occasionally).  Gastrointestinal: Positive for constipation (Alternates with diarrhea/constipation).  Endocrine: Negative.   Genitourinary: Negative.   Musculoskeletal: Positive for back pain (chronic).  Skin: Positive for rash (chronic yeast rash to abdomen on/off).  Allergic/Immunologic: Negative.   Neurological: Negative.   Hematological: Negative.   Psychiatric/Behavioral: Negative.       Objective:   Physical Exam  Constitutional: She is oriented to  person, place, and time. She appears well-developed and well-nourished. No distress.  HENT:  Head: Normocephalic and atraumatic.  Right Ear: Tympanic membrane normal.  Left Ear: Tympanic membrane normal.  Nose: Nose normal.  Mouth/Throat: Uvula is midline and oropharynx is clear and moist.  Eyes: Conjunctivae and EOM are normal. Pupils are equal, round, and reactive to light. No scleral icterus.  Neck: Normal range of motion. Neck supple. No thyromegaly present.  Cardiovascular: Normal rate, regular rhythm and normal heart sounds.  Exam reveals no gallop and no friction rub.   No murmur heard. Pulses:      Radial pulses are 2+ on the right side, and 2+ on the left side.       Dorsalis pedis pulses are 2+ on the right side, and 2+ on the left side.  Pulmonary/Chest: Effort normal and breath sounds normal.  Abdominal: Soft. Bowel sounds are normal.  Lymphadenopathy:       Head (right side): No submental, no submandibular, no tonsillar, no preauricular, no posterior auricular and no occipital adenopathy present.       Head (left side): No submental, no submandibular, no tonsillar, no preauricular, no posterior auricular and no occipital adenopathy present.    She has no cervical adenopathy.       Right: No supraclavicular adenopathy present.       Left: No supraclavicular adenopathy present.  Neurological: She is alert and oriented to person, place, and time.  Diabetic foot exam negative  Skin: Skin is warm, dry and intact.  Psychiatric: She has a normal  mood and affect. Her behavior is normal. Judgment and thought content normal.   Results for orders placed in visit on 02/02/14  GLUCOSE, POCT (MANUAL RESULT ENTRY)      Result Value Ref Range   POC Glucose 211 (*) 70 - 99 mg/dl  POCT GLYCOSYLATED HEMOGLOBIN (HGB A1C)      Result Value Ref Range   Hemoglobin A1C 9.5         Assessment & Plan:   1. DM A1c increased from 7.9 to 9.5.  Counseled on healthy diet, regular exercise,  weight loss.  Will add onglyza and recheck in 3 months. - POCT glucose (manual entry) - POCT glycosylated hemoglobin (Hb A1C) - HM Diabetes Foot Exam - metFORMIN (GLUCOPHAGE) 1000 MG tablet; Take 1 tablet (1,000 mg total) by mouth 2 (two) times daily with a meal.  Dispense: 180 tablet; Refill: 1 - saxagliptin HCl (ONGLYZA) 5 MG TABS tablet; Take 1 tablet (5 mg total) by mouth daily.  Dispense: 90 tablet; Refill: 4  2. Hypertension BP at goal.  Continue current treatment. - COMPLETE METABOLIC PANEL WITH GFR - Microalbumin, urine - losartan-hydrochlorothiazide (HYZAAR) 100-12.5 MG per tablet; Take 1 tablet by mouth daily.  Dispense: 90 tablet; Refill: 3  3. FATTY LIVER DISEASE History of fatty liver disease.  Discussed control of DM in preventing further decline. - Lipid panel  4. Hypothyroidism Continue current treatment - levothyroxine (SYNTHROID, LEVOTHROID) 25 MCG tablet; Take 1 tablet (25 mcg total) by mouth daily.  Dispense: 90 tablet; Refill: 3  5. Low back pain Chronic/stable. Continue current treatment  - cyclobenzaprine (FLEXERIL) 10 MG tablet; Take 1 tablet (10 mg total) by mouth 3 (three) times daily as needed. Dispense: 90 tablet; Refill: 1  - HYDROcodone-acetaminophen (NORCO) 10-325 MG per tablet; Take 1 tablet by mouth every 8 (eight) hours as needed. Dispense: 90 tablet; Refill: 0  - meloxicam (MOBIC) 15 MG tablet; 1/2 bid, or 1 QD, prn pain Dispense: 90 tablet; Refill: 3   6. Dermatitis Currently controlled with prn cream below.  - ketoconazole (NIZORAL) 2 % cream; Apply 1 application topically daily.  Dispense: 60 g; Refill: 3  7. OSA (obstructive sleep apnea)  CPAP initiated.  8. BARRETTS ESOPHAGUS  Scheduled for scope 02/09/14  Return in about 3 months (around 05/05/2014) for re-evaluation of diabetes.

## 2014-02-02 NOTE — Patient Instructions (Addendum)
It's extremely important that you make healthy eating choices and get regular exercise. Add the Onglyza to your current regimen.  I will contact you with your lab results as soon as they are available.   If you have not heard from me in 2 weeks, please contact me.  The fastest way to get your results is to register for My Chart (see the instructions on the last page of this printout).

## 2014-02-02 NOTE — Progress Notes (Signed)
I have examined this patient along with the student and agree.  

## 2014-02-03 LAB — MICROALBUMIN, URINE: Microalb, Ur: 14.16 mg/dL — ABNORMAL HIGH (ref 0.00–1.89)

## 2014-02-05 ENCOUNTER — Encounter: Payer: Self-pay | Admitting: Physician Assistant

## 2014-02-09 ENCOUNTER — Encounter: Payer: Self-pay | Admitting: Internal Medicine

## 2014-02-09 ENCOUNTER — Encounter: Payer: Self-pay | Admitting: *Deleted

## 2014-02-09 ENCOUNTER — Ambulatory Visit (AMBULATORY_SURGERY_CENTER): Payer: Medicare Other | Admitting: Internal Medicine

## 2014-02-09 VITALS — BP 107/62 | HR 81 | Temp 98.5°F | Resp 16 | Ht 64.5 in | Wt 227.0 lb

## 2014-02-09 DIAGNOSIS — K222 Esophageal obstruction: Secondary | ICD-10-CM | POA: Diagnosis not present

## 2014-02-09 DIAGNOSIS — K219 Gastro-esophageal reflux disease without esophagitis: Secondary | ICD-10-CM | POA: Diagnosis not present

## 2014-02-09 DIAGNOSIS — R1319 Other dysphagia: Secondary | ICD-10-CM

## 2014-02-09 DIAGNOSIS — K2100 Gastro-esophageal reflux disease with esophagitis, without bleeding: Secondary | ICD-10-CM

## 2014-02-09 DIAGNOSIS — K2270 Barrett's esophagus without dysplasia: Secondary | ICD-10-CM

## 2014-02-09 MED ORDER — SODIUM CHLORIDE 0.9 % IV SOLN: 500.0000 mL | INTRAVENOUS | Status: DC

## 2014-02-09 MED ORDER — ESOMEPRAZOLE MAGNESIUM 40 MG PO CPDR: DELAYED_RELEASE_CAPSULE | ORAL | Status: DC

## 2014-02-09 NOTE — Progress Notes (Signed)
Called to room to assist during endoscopic procedure.  Patient ID and intended procedure confirmed with present staff. Received instructions for my participation in the procedure from the performing physician.  

## 2014-02-09 NOTE — Progress Notes (Signed)
PT. Allergic to tape. Showed pt. Tape and she instructed what type of tape I could use for I.V. Site.small amount of paper tape used.

## 2014-02-09 NOTE — Op Note (Signed)
[facility] [address] [place], [id number]   ENDOSCOPY PROCEDURE REPORT  PATIENT: Cathy SquibbHolman, [place]a  MR#: [id number] BIRTHDATE: [birth year] , 52  yrs. old GENDER: Female ENDOSCOPIST: Hart Carwinora M Damara Klunder, MD REFERRED BY:  Jill SideJef [name], PA PROCEDURE DATE:  02/09/2014 PROCEDURE:  EGD w/ biopsy and Savary dilation of esophagus ASA CLASS:     Class II INDICATIONS:  Dysphagia.   history of Barrett's esophagus in 2000 and 2003.  No Barrett's in July 2011.  History of esophageal stricture.  Recent cough hoarseness and increasing reflux.Marland Kitchen. MEDICATIONS: MAC sedation, administered by CRNA and propofol (Diprivan) 200mg  IV TOPICAL ANESTHETIC: Cetacaine Spray  DESCRIPTION OF PROCEDURE: After the risks benefits and alternatives of the procedure were thoroughly explained, informed consent was obtained.  The LB [id number] endoscope was introduced through the mouth and advanced to the second portion of the duodenum. Without limitations.  The instrument was slowly withdrawn as the mucosa was fully examined.      Esophagus,: upper, mid  and distal esophageal mucosa appeared normal. There were no erosions. There was a mild nonobstructing stricture of the distal esophagus which allowed the endoscope to traverse into the stomach. There was a reducible 2-3 cm hiatal hernia. The Z line was slightly irregular. Biopsies were obtained to rule out Barrett's esophagus Stomach: Gastric mucosa was normal in the body in the gastric antrum. Pyloric outlet was unremarkable. Retroflexion of the endoscope revealed normal fundus and cardia Duodenum duodenal bulb and descending duodenum was normal Savory dilators passed through the distal esophagus without fluoroscopic guidance. Starting with 15, 16 and 17 mm dilators. There was blood on each dilator. Patient tolerated procedure well[        The scope was then withdrawn from the patient and the procedure completed.  COMPLICATIONS: There were  no complications. ENDOSCOPIC IMPRESSION:  mild nonobstructing benign-appearing distal esophageal stricture. Status post dilation to 17 mm 2-3 cm reducible hiatal hernia Irregular Z line. Status post biopsies to rule out Barrett's esophagus RECOMMENDATIONS: 1.  Await pathology results 2.  Anti-reflux regimen to be follow 3.  continue Nexium 40 mg daily Followup in the office in 3-4 weeks.  If cough and hoarseness continue consider 24-hour pH probe on PPI  REPEAT EXAM: for EGD pending biopsy results.  eSigned:  Hart Carwinora M Pallavi Clifton, MD 02/09/2014 9:06 AM   CC:

## 2014-02-09 NOTE — Patient Instructions (Addendum)
Call Dr. Regino SchultzeBrodie's office for a return appointment in 4 weeks.[phone])   YOU HAD AN ENDOSCOPIC PROCEDURE TODAY AT THE [facility]: Refer to the procedure report that was given to you for any specific questions about what was found during the examination.  If the procedure report does not answer your questions, please call your gastroenterologist to clarify.  If you requested that your care partner not be given the details of your procedure findings, then the procedure report has been included in a sealed envelope for you to review at your convenience later.  YOU SHOULD EXPECT: Some feelings of bloating in the abdomen. Passage of more gas than usual.  Walking can help get rid of the air that was put into your GI tract during the procedure and reduce the bloating. If you had a lower endoscopy (such as a colonoscopy or flexible sigmoidoscopy) you may notice spotting of blood in your stool or on the toilet paper. If you underwent a bowel prep for your procedure, then you may not have a normal bowel movement for a few days.  DIET:  Nothing to eat or drink until 10:00 10:00 until 11:00 only clear liquids. After 11:00 only soft food for the remainder of the day. Resume your diet in AM.  ACTIVITY: Your care partner should take you home directly after the procedure.  You should plan to take it easy, moving slowly for the rest of the day.  You can resume normal activity the day after the procedure however you should NOT DRIVE or use heavy machinery for 24 hours (because of the sedation medicines used during the test).    SYMPTOMS TO REPORT IMMEDIATELY: A gastroenterologist can be reached at any hour.  During normal business hours, 8:30 AM to 5:00 PM Monday through Friday, call [phone].  After hours and on weekends, please call the GI answering service at [phone] who will take a message and have the physician on call contact you.   Following upper endoscopy (EGD)  Vomiting of  blood or coffee ground material  New chest pain or pain under the shoulder blades  Painful or persistently difficult swallowing  New shortness of breath  Fever of 100F or higher  Black, tarry-looking stools  FOLLOW UP: If any biopsies were taken you will be contacted by phone or by letter within the next 1-3 weeks.  Call your gastroenterologist if you have not heard about the biopsies in 3 weeks.  Our staff will call the home number listed on your records the next business day following your procedure to check on you and address any questions or concerns that you may have at that time regarding the information given to you following your procedure. This is a courtesy call and so if there is no answer at the home number and we have not heard from you through the emergency physician on call, we will assume that you have returned to your regular daily activities without incident.  SIGNATURES/CONFIDENTIALITY: You and/or your care partner have signed paperwork which will be entered into your electronic medical record.  These signatures attest to the fact that that the information above on your After Visit Summary has been reviewed and is understood.  Full responsibility of the confidentiality of this discharge information lies with you and/or your care-partner.

## 2014-02-10 ENCOUNTER — Telehealth: Payer: Self-pay | Admitting: *Deleted

## 2014-02-10 NOTE — Telephone Encounter (Signed)
No answer left message to call if questions or concerns. 

## 2014-02-11 ENCOUNTER — Telehealth: Payer: Self-pay

## 2014-02-11 DIAGNOSIS — M545 Low back pain, unspecified: Secondary | ICD-10-CM

## 2014-02-11 DIAGNOSIS — E119 Type 2 diabetes mellitus without complications: Secondary | ICD-10-CM

## 2014-02-11 DIAGNOSIS — E039 Hypothyroidism, unspecified: Secondary | ICD-10-CM

## 2014-02-11 DIAGNOSIS — L309 Dermatitis, unspecified: Secondary | ICD-10-CM

## 2014-02-11 DIAGNOSIS — I10 Essential (primary) hypertension: Secondary | ICD-10-CM

## 2014-02-11 MED ORDER — LOSARTAN POTASSIUM-HCTZ 100-12.5 MG PO TABS: 1.0000 | ORAL_TABLET | Freq: Every day | ORAL | Status: DC

## 2014-02-11 MED ORDER — KETOCONAZOLE 2 % EX CREA: 1.0000 "application " | TOPICAL_CREAM | Freq: Every day | CUTANEOUS | Status: DC

## 2014-02-11 MED ORDER — SAXAGLIPTIN HCL 5 MG PO TABS: 5.0000 mg | ORAL_TABLET | Freq: Every day | ORAL | Status: DC

## 2014-02-11 MED ORDER — METFORMIN HCL 1000 MG PO TABS: 1000.0000 mg | ORAL_TABLET | Freq: Two times a day (BID) | ORAL | Status: DC

## 2014-02-11 MED ORDER — MELOXICAM 15 MG PO TABS: ORAL_TABLET | ORAL | Status: DC

## 2014-02-11 MED ORDER — LEVOTHYROXINE SODIUM 25 MCG PO TABS: 25.0000 ug | ORAL_TABLET | Freq: Every day | ORAL | Status: DC

## 2014-02-11 NOTE — Telephone Encounter (Signed)
PT STATES HER MEDICINE WENT TO THE WRONG PLACE, STATED SHE TOLD US PLAIN AS DAY TO SEND HER MEDICINES TO [facility] WILL GET PENALIZED IF IT GOES ANYWHERE ELSE EXCEPT [facility] PLEASE CALL PT AT [phone]     [facility]

## 2014-02-11 NOTE — Telephone Encounter (Signed)
Apologized and Verified Rxs w/pt and resent to [facility].

## 2014-02-15 ENCOUNTER — Ambulatory Visit: Payer: BC Managed Care – PPO | Admitting: Neurology

## 2014-02-15 ENCOUNTER — Telehealth: Payer: Self-pay

## 2014-02-15 MED ORDER — SITAGLIPTIN PHOSPHATE 100 MG PO TABS: 100.0000 mg | ORAL_TABLET | Freq: Every day | ORAL | Status: DC

## 2014-02-15 NOTE — Telephone Encounter (Signed)
losartan-hydrochlorothiazide (HYZAAR) 100-12.5 MG per tablet   Diabtetic meds too.    Both are to expensive   Wants [name] to stop these and prescribe something affordable  [phone]

## 2014-02-15 NOTE — Telephone Encounter (Signed)
Spoke w/pt and advised she call [facility] and have them fill losartan/HCTZ w/generic. Pt agreed and will CB if there is a problem with that. Pt also stated that she can't afford the Onglyza which is $100 co-pay. Reqs that [name] try her on another med that won't be as expensive.

## 2014-02-15 NOTE — Telephone Encounter (Signed)
Try Januvia instead.  Meds ordered this encounter  Medications  . sitaGLIPtin (JANUVIA) 100 MG tablet    Sig: Take 1 tablet (100 mg total) by mouth daily.    Dispense:  90 tablet    Refill:  3    Order Specific Question:  Supervising Provider    Answer:  DOOLITTLE, ROBERT P [[id number]]

## 2014-02-16 ENCOUNTER — Encounter: Payer: Self-pay | Admitting: Internal Medicine

## 2014-02-16 MED ORDER — GLIPIZIDE 5 MG PO TABS: 5.0000 mg | ORAL_TABLET | Freq: Two times a day (BID) | ORAL | Status: DC

## 2014-02-16 NOTE — Telephone Encounter (Signed)
Spoke with pt. She said this was much more affordable and will work really hard on weight loss/diet/exercise.

## 2014-02-16 NOTE — Telephone Encounter (Signed)
Next group of alternatives are much less expensive, but not as good in the long term.  However, uncontrolled glucose is also not acceptable in the long term.  Cancel Januvia.  Meds ordered this encounter  Medications  . DISCONTD: sitaGLIPtin (JANUVIA) 100 MG tablet    Sig: Take 1 tablet (100 mg total) by mouth daily.    Dispense:  90 tablet    Refill:  3    Order Specific Question:  Supervising Provider    Answer:  DOOLITTLE, ROBERT P [[id number]]  . glipiZIDE (GLUCOTROL) 5 MG tablet    Sig: Take 1 tablet (5 mg total) by mouth 2 (two) times daily before a meal.    Dispense:  180 tablet    Refill:  3    Order Specific Question:  Supervising Provider    Answer:  DOOLITTLE, ROBERT P [[id number]]

## 2014-02-16 NOTE — Telephone Encounter (Signed)
Spoke with pt. Januvia is also $100. Offered her some coupons (one for a free 30-d trial and one for a discounted price for 1 yr) but she said she didn't want them because she didn't want to start a med that she would eventually have to pay $100 for

## 2014-02-23 ENCOUNTER — Encounter: Payer: Self-pay | Admitting: Neurology

## 2014-02-23 ENCOUNTER — Ambulatory Visit (INDEPENDENT_AMBULATORY_CARE_PROVIDER_SITE_OTHER): Payer: BC Managed Care – PPO | Admitting: Neurology

## 2014-02-23 VITALS — BP 118/72 | HR 76 | Temp 98.2°F | Ht 64.5 in | Wt 229.0 lb

## 2014-02-23 DIAGNOSIS — I10 Essential (primary) hypertension: Secondary | ICD-10-CM | POA: Diagnosis not present

## 2014-02-23 DIAGNOSIS — E119 Type 2 diabetes mellitus without complications: Secondary | ICD-10-CM

## 2014-02-23 DIAGNOSIS — E669 Obesity, unspecified: Secondary | ICD-10-CM

## 2014-02-23 DIAGNOSIS — G4733 Obstructive sleep apnea (adult) (pediatric): Secondary | ICD-10-CM

## 2014-02-23 DIAGNOSIS — R0902 Hypoxemia: Secondary | ICD-10-CM | POA: Diagnosis not present

## 2014-02-23 DIAGNOSIS — G4734 Idiopathic sleep related nonobstructive alveolar hypoventilation: Secondary | ICD-10-CM

## 2014-02-23 NOTE — Patient Instructions (Signed)

## 2014-02-23 NOTE — Progress Notes (Signed)
Subjective:    Patient ID: Cathy Fuller is a 53 y.o. female.  HPI    Interim history:   Cathy Fuller is a 53 year old right-handed woman with an underlying medical history of hypertension, migraine headaches (since age 53, improved with time), hypothyroidism, obesity, type 2 diabetes, shoulder surgeries, and chronic low back pain on narcotic pain medication (prn norco, not on a daily basis), s/p back surgery, who presents for followup consultation of her obstructive sleep apnea. The patient is unaccompanied today. I first met her on 10/29/2013, at which time she reported a history of sleep apnea and complained of snoring as well as witnessed apneas. She had middle of the night and morning headaches. She also reported a weight gain in the realm of 40 pounds since her sleep studies in 2001. I advised her to come back for repeat sleep study testing. She had a baseline sleep study on 10/31/2013 as well as a CPAP titration study on 12/08/2013. I went over her test results with her in detail today. Her baseline sleep study showed a sleep efficiency at 87.5% with a latency to sleep of 8.5 minutes and wake after sleep onset of 44 minutes with moderate to severe sleep fragmentation. She had an elevated arousal index. She had a increased percentage of slow-wave sleep and a normal percentage of REM sleep with a prolonged REM latency. She had mild periodic leg movements with significant arousals at 11.4 per hour. Her total AHI was 6.7 per hour. This increased to 25.2 per hour in REM sleep. Baseline oxygen saturation was only 90% with a nadir of 76% in REM sleep. Time below 88% saturation was 24 minutes and 35 seconds. Her CPAP titration study from 12/08/2013 showed a sleep efficiency of 85.1% with a latency to sleep of 16 minutes and wake after sleep onset of 48 minutes with mild to moderate sleep fragmentation noted. She had a normal arousal index. She had an increased percentage of slow-wave sleep and a mildly  decreased percentage of REM sleep with a prolonged REM latency. She had no clinically significant. A clip movements. Snoring was eliminated. She was titrated on CPAP from 5-7 cm with a reduction of the AHI to 0 per hour at the final pressure. Average oxygen saturation was 93% with a nadir of 87%. Time below 88% saturation was 19 seconds only. Supine REM sleep was achieved on the final pressure. Post study the patient reported that she slept the same as usual and that she found CPAP easy-to-use. Based on her test results I prescribed CPAP for her. I reviewed the patient's CPAP compliance data from 12/25/2013 to 01/23/2014, which is a total of 30 days, during which time the patient used CPAP every day except for 2 days. The average usage for all days was 6 hours and 10 minutes. The percent used days greater than 4 hours was 93 %, indicating very good compliance. The residual AHI was 0.8 per hour, indicating an appropriate treatment pressure of 7 cwp with EPR of 3.  Today, I reviewed her compliance data from 12/24/2013 through 02/22/2014 which is the last 61 days during which time she is CPAP every night except for 3 nights. Percent used days greater than 4 hours was 95%. Average usage was 6 hours and 31 minutes, residual AHI low at 0.7 per hour and leg was very low, pressure at 7 with EPR of 3.  Today, she reports that her EDS is better and that her sleep is not as disrupted, more consolidated and  better quality. She has had issues with nasal soreness with the nasal pillows and alternates a nasal mask with nasal pillows.   She was diagnosed with obstructive sleep apnea in 2001. However, for some reason she never received treatment for this. She had 2 sleep studies, in July 2001 and September 2001. Her husband works second shift and they go to bed late, around 2 AM.  Her Past Medical History Is Significant For: Past Medical History  Diagnosis Date  . GERD (gastroesophageal reflux disease)   . PUD (peptic  ulcer disease)   . Anemia   . Diabetes mellitus without complication   . Thyroid disease   . Blood transfusion without reported diagnosis   . Barrett's esophagus   . Esophageal stricture   . Fatty liver 2009  . Internal hemorrhoids   . NASH (nonalcoholic steatohepatitis)     grade 1 fibrosis  . Hx of adenomatous colonic polyps   . Hyperplastic colon polyp   . IBS (irritable bowel syndrome)   . Hepatitis A 1983  . Hypertension   . Hyperlipidemia   . Sleep apnea   . Neuromuscular disorder     MILD CASE OF MS    Her Past Surgical History Is Significant For: Past Surgical History  Procedure Laterality Date  . Breast biopsy    . Hernia repair    . Carpal tunnel release Bilateral   . Back surgery    . Cholecystectomy, laparoscopic  1998  . Abdominal hysterectomy    . Knee surgery Left   . Shoulder surgery Left   . Bladder surgery      x 2  . Cholecystectomy    . Colonoscopy      Her Family History Is Significant For: Family History  Problem Relation Age of Onset  . Thyroid disease Mother   . Stroke Mother   . Stroke Father   . Diabetes Brother   . Colitis Daughter   . Colon cancer Maternal Uncle     Her Social History Is Significant For: History   Social History  . Marital Status: Married    Spouse Name: N/A    Number of Children: 2  . Years of Education: N/A   Occupational History  . Disabled     disabled   Social History Main Topics  . Smoking status: Never Smoker   . Smokeless tobacco: Never Used  . Alcohol Use: 0.5 - 1.0 oz/week    1-2 drink(s) per week     Comment: rare  . Drug Use: No  . Sexual Activity: None   Other Topics Concern  . None   Social History Narrative   Lives at home with husband and son.      Her Allergies Are:  Allergies  Allergen Reactions  . Aspirin   . Tape Other (See Comments)    Blisters  :   Her Current Medications Are:  Outpatient Encounter Prescriptions as of 02/23/2014  Medication Sig  .  Chromium-Cinnamon (CINNAMON PLUS CHROMIUM PO) Take 1,000 mg by mouth daily. TAKE 2 CAPS AT NIGHT  . cyclobenzaprine (FLEXERIL) 10 MG tablet Take 1 tablet (10 mg total) by mouth 3 (three) times daily as needed.  Marland Kitchen esomeprazole (NEXIUM) 40 MG capsule Take one daily at H.S.  . glipiZIDE (GLUCOTROL) 5 MG tablet Take 1 tablet (5 mg total) by mouth 2 (two) times daily before a meal.  . Glucosamine-Chondroitin (JOINT SUPPORT PO) Take 1,000 mg by mouth 2 (two) times daily before a meal.  .  HYDROcodone-acetaminophen (NORCO) 10-325 MG per tablet Take 1 tablet by mouth every 8 (eight) hours as needed.  Marland Kitchen ketoconazole (NIZORAL) 2 % cream Apply 1 application topically daily.  Marland Kitchen levothyroxine (SYNTHROID, LEVOTHROID) 25 MCG tablet Take 1 tablet (25 mcg total) by mouth daily.  . meloxicam (MOBIC) 15 MG tablet 1/2 bid, or 1 QD, prn pain  . metFORMIN (GLUCOPHAGE) 1000 MG tablet Take 1 tablet (1,000 mg total) by mouth 2 (two) times daily with a meal.  . PRESCRIPTION MEDICATION Famotadine acid reducer 20 mg taking 1 in the am and 2 at night  . [DISCONTINUED] losartan-hydrochlorothiazide (HYZAAR) 100-12.5 MG per tablet Take 1 tablet by mouth daily.  :  Review of Systems:  Out of a complete 14 point review of systems, all are reviewed and negative with the exception of these symptoms as listed below:   Review of Systems  Constitutional: Positive for unexpected weight change.  HENT: Positive for trouble swallowing.   Eyes: Negative.   Respiratory: Positive for cough.   Cardiovascular: Negative.   Gastrointestinal: Positive for diarrhea and constipation.  Endocrine: Positive for heat intolerance and polydipsia.  Genitourinary: Negative.   Musculoskeletal: Positive for arthralgias and myalgias.       Cramps  Skin: Negative.   Allergic/Immunologic: Negative.   Neurological: Positive for headaches.       Short term memory loss  Hematological: Negative.   Psychiatric/Behavioral: Negative.     Objective:   Neurologic Exam  Physical Exam Physical Examination:   Filed Vitals:   02/23/14 0904  BP: 118/72  Pulse: 76  Temp: 98.2 F (36.8 C)   Physical Examination:   Filed Vitals:   02/23/14 0904  BP: 118/72  Pulse: 76  Temp: 98.2 F (36.8 C)    General Examination: The patient is a very pleasant 53 y.o. female in no acute distress. She appears well-developed and well-nourished and well groomed.   HEENT: Normocephalic, atraumatic, pupils are equal, round and reactive to light and accommodation. Funduscopic exam is normal with sharp disc margins noted. Extraocular tracking is good without limitation to gaze excursion or nystagmus noted. Normal smooth pursuit is noted. Hearing is grossly intact. Face is symmetric with normal facial animation and normal facial sensation. Speech is clear with no dysarthria noted. There is no hypophonia. There is no lip, neck/head, jaw or voice tremor. Neck is supple with full range of passive and active motion. There are no carotid bruits on auscultation. Oropharynx exam reveals: mild mouth dryness, adequate dental hygiene and mild airway crowding, due to floppy soft palate and elongated tongue. Mallampati is class I. Tongue protrudes centrally and palate elevates symmetrically. Tonsils are absent.   Chest: Clear to auscultation without wheezing, rhonchi or crackles noted.  Heart: S1+S2+0, regular and normal without murmurs, rubs or gallops noted.   Abdomen: Soft, non-tender and non-distended with normal bowel sounds appreciated on auscultation.  Extremities: There is trace pitting edema in the distal lower extremities bilaterally. Pedal pulses are intact.  Skin: Warm and dry without trophic changes noted. There are no varicose veins.  Musculoskeletal: exam reveals no obvious joint deformities, tenderness or joint swelling or erythema.   Neurologically:  Mental status: The patient is awake, alert and oriented in all 4 spheres. Her memory, attention,  language and knowledge are appropriate. There is no aphasia, agnosia, apraxia or anomia. Speech is clear with normal prosody and enunciation. Thought process is linear. Mood is congruent and affect is normal.  Cranial nerves are as described above under HEENT exam.  In addition, shoulder shrug is normal with equal shoulder height noted. Motor exam: Normal bulk, strength and tone is noted. There is no drift, tremor or rebound. Romberg is negative. Reflexes are 1+ throughout. Toes are downgoing bilaterally. Fine motor skills are intact with normal finger taps, normal hand movements, normal rapid alternating patting, normal foot taps and normal foot agility.  Cerebellar testing shows no dysmetria or intention tremor on finger to nose testing. Heel to shin is unremarkable bilaterally. There is no truncal or gait ataxia.  Sensory exam is intact to light touch, pinprick, vibration, temperature sense in the upper and lower extremities.  Gait, station and balance are unremarkable. No veering to one side is noted. No leaning to one side is noted. Posture is age-appropriate and stance is narrow based. No problems turning are noted. She turns en bloc. Tandem walk is unremarkable. Intact toe and heel stance is noted.                Assessment and Plan:   In summary, Cathy Fuller is a very pleasant 53 y.o.-year old female with an underlying medical history of hypertension, migraine headaches (since age 49, improved with time), hypothyroidism, obesity, type 2 diabetes, shoulder surgeries, and chronic low back pain on narcotic pain medication (prn norco, not on a daily basis), s/p back surgery, with a history of OSA, now on CPAP treatment at a pressure of 7 cwp. Her physical exam is stable and She indicates good results with the use of CPAP, and good tolerance of the pressure and mask. I reviewed the compliance data with the patient and encouraged her to continue to use CPAP regularly to help reduce cardiovascular risk.    We also talked about trying to maintaining a healthy lifestyle in general. I encouraged the patient to eat healthy, exercise daily and keep well hydrated, to keep a scheduled bedtime and wake time routine, to not skip any meals and eat healthy snacks in between meals and to have protein with every meal. I stressed the importance of regular exercise.   I answered all her questions today and the patient was in agreement with the above outlined plan. I would like to see the patient back in 6 months, sooner if the need arises and encouraged her to call with any interim questions, concerns, problems or updates.  Most of my 25 minute visit today was spent in counseling and coordination of care, reviewing test results and reviewing medication.

## 2014-02-26 ENCOUNTER — Telehealth: Payer: Self-pay

## 2014-02-26 NOTE — Telephone Encounter (Deleted)
Patient is requesting a script for Phenadrine or dramamine for nausea.  She states the vicodin (which she has yet to fill) makes her stomach hurt.   [facility]  [phone]

## 2014-02-26 NOTE — Telephone Encounter (Signed)
Patient is requesting [name] to provide a list of MDS for Liver Disease.   [phone]

## 2014-02-28 NOTE — Telephone Encounter (Signed)
Please clarify what this patient is asking for.

## 2014-03-01 NOTE — Telephone Encounter (Signed)
Patient notified via My Chart.  [place]  [place] you have many good choices for care from specialists who manage liver disease. [facility] is the largest group, and I recommend Dr. Stan Headarl Gessner and Dr. Lina Sarora Brodie in that group. [facility] has Dr. Charna ElizabethJyothi Mann and Dr. Jeani HawkingPatrick Hung. There is also an [facility] group, and I am less familiar with them (there are 8, and include Dr. Molly Maduroobert Buccini, Dr. Carman ChingJames Edwards, and Dr. Vida RiggerMarc Magod).  You are welcome to contact these offices directly, or I am happy to refer you.  Please let me know how you would like to proceed.  [place], Garfield Coiner

## 2014-03-01 NOTE — Telephone Encounter (Signed)
Patient requests names of doctor's that deal with issues of the liver.  She said that she has been having issues with pain in her liver since 2002.  It is slowly getting worse.  She states that "everyone" keeps telling her that she needs to lose weight, but she does not feel this pain is associated with a weight issue. Her A1C is going up since liver pain is worsening.  She states she has set up her My Chart and requests that response to this message be sent through My Chart so she can see the names written out for her.

## 2014-03-09 ENCOUNTER — Ambulatory Visit: Payer: BC Managed Care – PPO | Admitting: Internal Medicine

## 2014-03-23 ENCOUNTER — Ambulatory Visit: Payer: BC Managed Care – PPO | Admitting: Internal Medicine

## 2014-05-04 ENCOUNTER — Encounter: Payer: Self-pay | Admitting: Physician Assistant

## 2014-05-04 ENCOUNTER — Ambulatory Visit (INDEPENDENT_AMBULATORY_CARE_PROVIDER_SITE_OTHER): Payer: BC Managed Care – PPO | Admitting: Physician Assistant

## 2014-05-04 VITALS — BP 127/77 | HR 79 | Temp 98.1°F | Resp 16 | Ht 64.25 in | Wt 223.2 lb

## 2014-05-04 DIAGNOSIS — I10 Essential (primary) hypertension: Secondary | ICD-10-CM

## 2014-05-04 DIAGNOSIS — M545 Low back pain, unspecified: Secondary | ICD-10-CM

## 2014-05-04 DIAGNOSIS — E039 Hypothyroidism, unspecified: Secondary | ICD-10-CM

## 2014-05-04 DIAGNOSIS — E119 Type 2 diabetes mellitus without complications: Secondary | ICD-10-CM

## 2014-05-04 DIAGNOSIS — E669 Obesity, unspecified: Secondary | ICD-10-CM | POA: Insufficient documentation

## 2014-05-04 DIAGNOSIS — R809 Proteinuria, unspecified: Secondary | ICD-10-CM

## 2014-05-04 DIAGNOSIS — Z1239 Encounter for other screening for malignant neoplasm of breast: Secondary | ICD-10-CM

## 2014-05-04 LAB — LIPID PANEL
CHOLESTEROL: 189 mg/dL (ref 0–200)
HDL: 45 mg/dL (ref >39)
LDL CALC: 71 mg/dL (ref 0–99)
TRIGLYCERIDES: 366 mg/dL — AB (ref <150)
Total CHOL/HDL Ratio: 4.2 Ratio
VLDL: 73 mg/dL — ABNORMAL HIGH (ref 0–40)

## 2014-05-04 LAB — COMPLETE METABOLIC PANEL WITH GFR
ALBUMIN: 4.6 g/dL (ref 3.5–5.2)
ALT: 45 U/L — ABNORMAL HIGH (ref 0–35)
AST: 47 U/L — ABNORMAL HIGH (ref 0–37)
Alkaline Phosphatase: 64 U/L (ref 39–117)
BUN: 16 mg/dL (ref 6–23)
CHLORIDE: 104 meq/L (ref 96–112)
CO2: 23 mEq/L (ref 19–32)
CREATININE: 1.01 mg/dL (ref 0.50–1.10)
Calcium: 9.2 mg/dL (ref 8.4–10.5)
GFR, EST AFRICAN AMERICAN: 73 mL/min
GFR, Est Non African American: 63 mL/min
Glucose, Bld: 148 mg/dL — ABNORMAL HIGH (ref 70–99)
Potassium: 4.6 mEq/L (ref 3.5–5.3)
Sodium: 138 mEq/L (ref 135–145)
Total Bilirubin: 0.5 mg/dL (ref 0.2–1.2)
Total Protein: 7.8 g/dL (ref 6.0–8.3)

## 2014-05-04 LAB — MICROALBUMIN, URINE: Microalb, Ur: 1.51 mg/dL (ref 0.00–1.89)

## 2014-05-04 LAB — POCT GLYCOSYLATED HEMOGLOBIN (HGB A1C): Hemoglobin A1C: 7.7

## 2014-05-04 LAB — TSH: TSH: 2.95 u[IU]/mL (ref 0.350–4.500)

## 2014-05-04 MED ORDER — GLIPIZIDE 5 MG PO TABS: 5.0000 mg | ORAL_TABLET | Freq: Two times a day (BID) | ORAL | Status: DC

## 2014-05-04 MED ORDER — CYCLOBENZAPRINE HCL 10 MG PO TABS: 10.0000 mg | ORAL_TABLET | Freq: Three times a day (TID) | ORAL | Status: DC | PRN

## 2014-05-04 MED ORDER — METFORMIN HCL 1000 MG PO TABS: 1000.0000 mg | ORAL_TABLET | Freq: Two times a day (BID) | ORAL | Status: DC

## 2014-05-04 MED ORDER — LOSARTAN POTASSIUM-HCTZ 100-12.5 MG PO TABS: 1.0000 | ORAL_TABLET | Freq: Every day | ORAL | Status: DC

## 2014-05-04 MED ORDER — MELOXICAM 15 MG PO TABS: ORAL_TABLET | ORAL | Status: DC

## 2014-05-04 MED ORDER — HYDROCODONE-ACETAMINOPHEN 10-325 MG PO TABS: 1.0000 | ORAL_TABLET | Freq: Three times a day (TID) | ORAL | Status: DC | PRN

## 2014-05-04 MED ORDER — LEVOTHYROXINE SODIUM 25 MCG PO TABS: 25.0000 ug | ORAL_TABLET | Freq: Every day | ORAL | Status: DC

## 2014-05-04 NOTE — Progress Notes (Signed)
Subjective:    Patient ID: Cathy SquibbJanelle Simonian, female    DOB: 30-Jun-1961, 53 y.o.   MRN: [id number]   PCP: JEFFERY,[name], PA-C  Chief Complaint  Patient presents with  . Diabetes    3 month follow up  . Hypertension  . Hypothyroidism   Medications, allergies, past medical history, surgical history, family history, social history and problem list reviewed and updated.  Patient Active Problem List   Diagnosis Date Noted  . OSA (obstructive sleep apnea) 10/27/2013  . Hypertension 01/30/2012  . Hypothyroidism 01/30/2012  . DM (diabetes mellitus), type 2, uncontrolled 05/29/2010  . BARRETTS ESOPHAGUS 05/29/2010  . FATTY LIVER DISEASE 05/29/2010  . COLONIC POLYPS, ADENOMATOUS, HX OF 05/29/2010    Prior to Admission medications   Medication Sig Start Date End Date Taking? Authorizing Provider  cyclobenzaprine (FLEXERIL) 10 MG tablet Take 1 tablet (10 mg total) by mouth 3 (three) times daily as needed. 10/27/13  Yes [name] S Jeffery, PA-C  famotidine (PEPCID) 20 MG tablet Take 20 mg by mouth 2 (two) times daily. 20 mg QAM, 40 mg QHS   Yes Historical Provider, MD  glipiZIDE (GLUCOTROL) 5 MG tablet Take 1 tablet (5 mg total) by mouth 2 (two) times daily before a meal. 02/16/14  Yes [name] S Jeffery, PA-C  HYDROcodone-acetaminophen (NORCO) 10-325 MG per tablet Take 1 tablet by mouth every 8 (eight) hours as needed. 02/02/14  Yes [name] S Jeffery, PA-C  ketoconazole (NIZORAL) 2 % cream Apply 1 application topically daily. 02/11/14  Yes [name] S Jeffery, PA-C  levothyroxine (SYNTHROID, LEVOTHROID) 25 MCG tablet Take 1 tablet (25 mcg total) by mouth daily. 02/11/14  Yes [name] S Jeffery, PA-C  losartan-hydrochlorothiazide (HYZAAR) 100-12.5 MG per tablet Take 1 tablet by mouth daily.   Yes Historical Provider, MD  meloxicam (MOBIC) 15 MG tablet 1/2 bid, or 1 QD, prn pain 02/11/14  Yes [name] S Jeffery, PA-C  metFORMIN (GLUCOPHAGE) 1000 MG tablet Take 1 tablet (1,000 mg total) by mouth 2 (two) times daily  with a meal. 02/11/14  Yes [name] S Jeffery, PA-C  Chromium-Cinnamon (CINNAMON PLUS CHROMIUM PO) Take 1,000 mg by mouth daily. TAKE 2 CAPS AT NIGHT    Historical Provider, MD  Glucosamine-Chondroitin (JOINT SUPPORT PO) Take 1,000 mg by mouth 2 (two) times daily before a meal.    Historical Provider, MD    HPI  Had an upper endoscopy, but didn't go back for the recommended follow-up.  Notes less cough, hoarseness and reflux symptoms.  LEFT lateral foot is more painful x 1 month.  Massage hurts, but after feels better.  "I just need to find a new orthopedic doctor." Sees Dr. Charlett BlakeVoytek, who referred her to someone else who wants to do a lumbar and thoracic fusion, which she doesn't want (she'd agree to a decompression). Can't do the hiking she'd like to do with her husband and granddaughter.  Frequency of home glucose monitoring: QD; 150-170's; hypoglycemic symptoms intact. Doesn't see a dentist regularly (last exam 3-4 years ago), sees an eye specialist annually. Checks feet daily. Is current with influenza vaccine. Is current with pneumococcal vaccine (10/27/2013, next dose due at age 53).   Review of Systems As above. Some loose stools this morning. Non-itchy red rash on both feet. Uses eucalyptus oil on both feet. Otherwise negative.    Objective:   Physical Exam  Vitals reviewed. Constitutional: She is oriented to person, place, and time. She appears well-developed and well-nourished. No distress.  BP 127/77  Pulse 79  Temp(Src)  98.1 F (36.7 C) (Oral)  Resp 16  Ht 5' 4.25" (1.632 m)  Wt 223 lb 3.2 oz (101.243 kg)  BMI 38.01 kg/m2  SpO2 98%   Eyes: Conjunctivae are normal. No scleral icterus.  Neck: No thyromegaly present.  Cardiovascular: Normal rate, regular rhythm, normal heart sounds and intact distal pulses.   Pulmonary/Chest: Effort normal and breath sounds normal.  Lymphadenopathy:    She has no cervical adenopathy.  Neurological: She is alert and oriented to person,  place, and time.  Skin: Skin is warm and dry.  Psychiatric: She has a normal mood and affect. Her behavior is normal.   See diabetic foot exam. Rash on feet. Dystrophic nails.  Results for orders placed in visit on 05/04/14  POCT GLYCOSYLATED HEMOGLOBIN (HGB A1C)      Result Value Ref Range   Hemoglobin A1C 7.7         Assessment & Plan:  1. Type II or unspecified type diabetes mellitus without mention of complication, not stated as uncontrolled Congratulated on significant improvement!  Continue current regimen. - POCT glycosylated hemoglobin (Hb A1C) - COMPLETE METABOLIC PANEL WITH GFR - Lipid panel - [facility] Diabetes Eye Exam - Microalbumin, urine - glipiZIDE (GLUCOTROL) 5 MG tablet; Take 1 tablet (5 mg total) by mouth 2 (two) times daily before a meal.  Dispense: 180 tablet; Refill: 3 - metFORMIN (GLUCOPHAGE) 1000 MG tablet; Take 1 tablet (1,000 mg total) by mouth 2 (two) times daily with a meal.  Dispense: 180 tablet; Refill: 3 - [facility] Diabetes Foot Exam  2. Essential hypertension Controlled. Continue current regimen. - POCT CBC - COMPLETE METABOLIC PANEL WITH GFR - Lipid panel - Microalbumin, urine - losartan-hydrochlorothiazide (HYZAAR) 100-12.5 MG per tablet; Take 1 tablet by mouth daily.  Dispense: 90 tablet; Refill: 3  3. Hypothyroidism, unspecified hypothyroidism type Await lab.  Continue current regimen. - TSH - levothyroxine (SYNTHROID, LEVOTHROID) 25 MCG tablet; Take 1 tablet (25 mcg total) by mouth daily.  Dispense: 90 tablet; Refill: 3  4. Microalbuminuria Await lab.  Maintain good glucose and BP control. - Microalbumin, urine  5. Screening for breast cancer - [facility] MAMMOGRAPHY - MM Digital Diagnostic Bilat; Future  6. Low back pain, unspecified back pain laterality, with sciatica presence unspecified Stable, though her foot pain persists and she doesn't want to have the recommended surgery.  She'll ask Dr. Charlett BlakeVoytek about alternatives. - HYDROcodone-acetaminophen  (NORCO) 10-325 MG per tablet; Take 1 tablet by mouth every 8 (eight) hours as needed.  Dispense: 90 tablet; Refill: 0 - cyclobenzaprine (FLEXERIL) 10 MG tablet; Take 1 tablet (10 mg total) by mouth 3 (three) times daily as needed.  Dispense: 90 tablet; Refill: 1 - meloxicam (MOBIC) 15 MG tablet; 1/2 bid, or 1 QD, prn pain  Dispense: 90 tablet; Refill: 3  Return in about 3 months (around 08/04/2014) for re-evaluation of diabetes, blood pressure.  Fernande Brashelle S. Jeffery, PA-C Physician Assistant-Certified [facility]

## 2014-05-04 NOTE — Patient Instructions (Signed)
I will contact you with your lab results as soon as they are available.   If you have not heard from me in 2 weeks, please contact me.  The fastest way to get your results is to register for My Chart (see the instructions on the last page of this printout).  Ask Dr. Charlett BlakeVoytek to send you for a second surgical opinion, since you are not interested in the procedure offered by the person she sent you to.  Use the ketoconazole cream on your feet.  COntinue working on healthy eating and getting regular exercise.

## 2014-05-11 LAB — HM MAMMOGRAPHY: HM MAMMO: NEGATIVE

## 2014-05-15 ENCOUNTER — Encounter: Payer: Self-pay | Admitting: Physician Assistant

## 2014-07-27 ENCOUNTER — Telehealth: Payer: Self-pay

## 2014-07-27 NOTE — Telephone Encounter (Signed)
Received notice from [facility] that pt is allergic to aspirin and that meloxicam is in similar class. They wanted to make sure pt is OK to take this med. I called pt who has been taking the meloxicam and she stated that at one time she was taking too many aspirin and started having SE of ringing in the ears. Pt stated she has never had any problems taking the meloxicam. I sent this info to [facility].

## 2014-07-28 NOTE — Telephone Encounter (Signed)
[name] signed document and I faxed back to [facility].

## 2014-08-17 ENCOUNTER — Ambulatory Visit: Payer: BC Managed Care – PPO | Admitting: Physician Assistant

## 2014-08-25 ENCOUNTER — Ambulatory Visit: Payer: BC Managed Care – PPO | Admitting: Neurology

## 2014-10-22 DIAGNOSIS — M5416 Radiculopathy, lumbar region: Secondary | ICD-10-CM | POA: Diagnosis not present

## 2014-10-28 DIAGNOSIS — M545 Low back pain: Secondary | ICD-10-CM | POA: Diagnosis not present

## 2014-11-01 DIAGNOSIS — M545 Low back pain: Secondary | ICD-10-CM | POA: Diagnosis not present

## 2014-11-02 ENCOUNTER — Telehealth: Payer: Self-pay | Admitting: Physician Assistant

## 2014-11-02 NOTE — Telephone Encounter (Signed)
Patient request for Cathy Fuller to complete disability parking placard form. Patient states the form expires on December. 31, 2015. I will place the form in the nurse's box at 102. Please call patient when form is complete. [phone]

## 2014-11-03 NOTE — Telephone Encounter (Signed)
Form completed and returned to the Nurse's box at 102.

## 2014-11-03 NOTE — Telephone Encounter (Signed)
Form completed. Copy made for scanning. Pt advised.

## 2014-12-24 ENCOUNTER — Telehealth: Payer: Self-pay

## 2014-12-24 NOTE — Telephone Encounter (Signed)
Pt needs work form filled out based off last physical.  Please call when ready to pick up at [phone]

## 2014-12-24 NOTE — Telephone Encounter (Signed)
Form placed in [name]'s box.

## 2014-12-24 NOTE — Telephone Encounter (Signed)
This paperwork has been left at the nurses station.

## 2014-12-27 NOTE — Telephone Encounter (Signed)
Blank form received.  In the future, please complete what portions of the form you are able (clerical-patient name, DOB, etc if the patient hasn't already; clinical-lab results, date, etc).  I completed what I could. Information that remains incomplete: -is patient an employee or spuse? -patient's email address and whether or not they can use the email address. -patient's signature allowing disclosure of her information -was she fasting at the time of her labs? -waist circumference (we do not routinely perform this, and it is not required on the form).  I note that the form states that they are looking for biometric testing performed between 08/12/2014 and 08/12/2015. The last visit here was 05/04/2014, prior to the time frame requested.  We may need a new form, and she certainly needs an OV and fasting labs.  Form returned to nurses box.

## 2014-12-27 NOTE — Telephone Encounter (Signed)
Received from nurse's box. Called pt to let her know form was ready to pick up. Pt notified.

## 2015-02-21 ENCOUNTER — Telehealth: Payer: Self-pay

## 2015-02-21 DIAGNOSIS — E1165 Type 2 diabetes mellitus with hyperglycemia: Secondary | ICD-10-CM

## 2015-02-21 DIAGNOSIS — IMO0002 Reserved for concepts with insufficient information to code with codable children: Secondary | ICD-10-CM

## 2015-02-21 NOTE — Telephone Encounter (Signed)
Pt states there was a mix-up with  Her [facility] and she needs a week supply of her Metformin  Called to her pharmacy   Pharmacy [facility] [address]  Pt phone  [phone]

## 2015-02-21 NOTE — Telephone Encounter (Signed)
Pt called in for status on this refill, I advised her that the message was in, but it generally takes about 24-48 hours for refill requests. Patient stated," that is ridiculous, I will just go without my medication for a week," and hung up the phone.

## 2015-02-22 MED ORDER — METFORMIN HCL 1000 MG PO TABS: 1000.0000 mg | ORAL_TABLET | Freq: Two times a day (BID) | ORAL | Status: DC

## 2015-02-22 NOTE — Telephone Encounter (Signed)
Pt cancelled last two appts. Can we send in a refill?

## 2015-02-22 NOTE — Telephone Encounter (Signed)
I have sent a 2 week supply to her local pharmacy. Please remind her that she's overdue for follow-up, and needs to schedule an appointment or see me at the walk-in center.

## 2015-02-22 NOTE — Telephone Encounter (Signed)
Spoke with pt, advised to RTC and Rx was sent .

## 2015-04-29 ENCOUNTER — Other Ambulatory Visit: Payer: Self-pay | Admitting: Physician Assistant

## 2015-05-02 NOTE — Telephone Encounter (Signed)
What is her follow-up plan?  I haven't seen her in >12 months.

## 2015-05-02 NOTE — Telephone Encounter (Signed)
Cydney Ok, CMA at 02/22/2015 12:06 PM     Status: Signed       Expand All Collapse All   Spoke with pt, advised to RTC and Rx was sent .            Fernande Bras, PA-C at 02/22/2015 10:16 AM     Status: Signed       Expand All Collapse All   I have sent a 2 week supply to her local pharmacy. Please remind her that she's overdue for follow-up, and needs to schedule an appointment or see me at the walk-in center.       [name] pt was warned and I spoke with her to let her know she has to come in. Did you want to refill one more time?

## 2015-05-08 ENCOUNTER — Other Ambulatory Visit: Payer: Self-pay | Admitting: Physician Assistant

## 2015-05-19 ENCOUNTER — Encounter: Payer: Self-pay | Admitting: Internal Medicine

## 2015-06-08 ENCOUNTER — Telehealth: Payer: Self-pay

## 2015-06-08 DIAGNOSIS — IMO0002 Reserved for concepts with insufficient information to code with codable children: Secondary | ICD-10-CM

## 2015-06-08 DIAGNOSIS — E1165 Type 2 diabetes mellitus with hyperglycemia: Secondary | ICD-10-CM

## 2015-06-08 NOTE — Telephone Encounter (Signed)
Per [name]'s note, patient has been overdue for an office visit since 02/2015. She needs to rtc for refills. Per the records she has not been back for an office visit since 04/2014.

## 2015-06-08 NOTE — Telephone Encounter (Signed)
Does anyone agree?

## 2015-06-08 NOTE — Telephone Encounter (Signed)
Pt needs office visit

## 2015-06-08 NOTE — Telephone Encounter (Signed)
Pt is needing refills on her [facility]s for [facility]

## 2015-06-09 ENCOUNTER — Telehealth: Payer: Self-pay

## 2015-06-09 MED ORDER — METFORMIN HCL 1000 MG PO TABS: 1000.0000 mg | ORAL_TABLET | Freq: Two times a day (BID) | ORAL | Status: DC

## 2015-06-09 MED ORDER — GLIPIZIDE 5 MG PO TABS: 5.0000 mg | ORAL_TABLET | Freq: Two times a day (BID) | ORAL | Status: DC

## 2015-06-09 NOTE — Telephone Encounter (Signed)
[facility] is calling to reques refills for patient to make them a 90 day supply as the patient is going out of town   Number [phone] ref # [id number]

## 2015-06-09 NOTE — Telephone Encounter (Signed)
Request denied. Pt has to come in for a 90 day supply. Spoke with [facility] and advised request denied.

## 2015-06-09 NOTE — Telephone Encounter (Signed)
Pt has an appt on 8/30 and promises to keep it. Refilled meds until then

## 2015-06-10 LAB — HM MAMMOGRAPHY: HM MAMMO: NEGATIVE

## 2015-06-11 ENCOUNTER — Telehealth: Payer: Self-pay

## 2015-06-12 ENCOUNTER — Other Ambulatory Visit: Payer: Self-pay | Admitting: Physician Assistant

## 2015-06-13 ENCOUNTER — Ambulatory Visit (INDEPENDENT_AMBULATORY_CARE_PROVIDER_SITE_OTHER): Payer: BLUE CROSS/BLUE SHIELD

## 2015-06-13 ENCOUNTER — Ambulatory Visit (INDEPENDENT_AMBULATORY_CARE_PROVIDER_SITE_OTHER): Payer: BLUE CROSS/BLUE SHIELD | Admitting: Physician Assistant

## 2015-06-13 VITALS — BP 124/80 | HR 98 | Temp 98.6°F | Resp 17 | Ht 64.0 in | Wt 226.0 lb

## 2015-06-13 DIAGNOSIS — M25571 Pain in right ankle and joints of right foot: Secondary | ICD-10-CM | POA: Diagnosis not present

## 2015-06-13 DIAGNOSIS — E1165 Type 2 diabetes mellitus with hyperglycemia: Secondary | ICD-10-CM | POA: Diagnosis not present

## 2015-06-13 DIAGNOSIS — M25561 Pain in right knee: Secondary | ICD-10-CM

## 2015-06-13 DIAGNOSIS — M25511 Pain in right shoulder: Secondary | ICD-10-CM

## 2015-06-13 DIAGNOSIS — Z114 Encounter for screening for human immunodeficiency virus [HIV]: Secondary | ICD-10-CM

## 2015-06-13 DIAGNOSIS — M545 Low back pain: Secondary | ICD-10-CM

## 2015-06-13 DIAGNOSIS — IMO0002 Reserved for concepts with insufficient information to code with codable children: Secondary | ICD-10-CM

## 2015-06-13 LAB — COMPREHENSIVE METABOLIC PANEL
ALBUMIN: 4.2 g/dL (ref 3.6–5.1)
ALK PHOS: 82 U/L (ref 33–130)
ALT: 60 U/L — ABNORMAL HIGH (ref 6–29)
AST: 55 U/L — ABNORMAL HIGH (ref 10–35)
BILIRUBIN TOTAL: 0.6 mg/dL (ref 0.2–1.2)
BUN: 27 mg/dL — ABNORMAL HIGH (ref 7–25)
CALCIUM: 9.5 mg/dL (ref 8.6–10.4)
CHLORIDE: 100 mmol/L (ref 98–110)
CO2: 23 mmol/L (ref 20–31)
Creat: 1.62 mg/dL — ABNORMAL HIGH (ref 0.50–1.05)
Glucose, Bld: 525 mg/dL (ref 65–99)
POTASSIUM: 4.3 mmol/L (ref 3.5–5.3)
Sodium: 137 mmol/L (ref 135–146)
Total Protein: 7.5 g/dL (ref 6.1–8.1)

## 2015-06-13 LAB — LIPID PANEL
CHOL/HDL RATIO: 4.4 ratio (ref <=5.0)
Cholesterol: 201 mg/dL — ABNORMAL HIGH (ref 125–200)
HDL: 46 mg/dL (ref >=46)
LDL Cholesterol: 89 mg/dL (ref <130)
TRIGLYCERIDES: 332 mg/dL — AB (ref <150)
VLDL: 66 mg/dL — ABNORMAL HIGH (ref <30)

## 2015-06-13 LAB — POCT GLYCOSYLATED HEMOGLOBIN (HGB A1C): HEMOGLOBIN A1C: 10.1

## 2015-06-13 LAB — GLUCOSE, POCT (MANUAL RESULT ENTRY): POC Glucose: 444 mg/dl — AB (ref 70–99)

## 2015-06-13 MED ORDER — METFORMIN HCL 1000 MG PO TABS: 1000.0000 mg | ORAL_TABLET | Freq: Two times a day (BID) | ORAL | Status: DC

## 2015-06-13 MED ORDER — HYDROCODONE-ACETAMINOPHEN 10-325 MG PO TABS: 1.0000 | ORAL_TABLET | Freq: Three times a day (TID) | ORAL | Status: DC | PRN

## 2015-06-13 NOTE — Patient Instructions (Signed)
It's SO IMPORTANT that you take your medication as prescribed every day, make healthy eating choices and get regular exercise.

## 2015-06-13 NOTE — Progress Notes (Signed)
   Subjective:    Patient ID: Cathy Fuller, female    DOB: 1961-01-18, 54 y.o.   MRN: [id number]  HPI  Pt states that she was working in her raised garden bed this morning after the rain and right foot slid off of the garden bed and then fell on lateral aspect of right hand. Pt denies hitting her head or LOC. Pt able to ambulate after incident. Pt states that she tried catching herself on her wrist but it caused pain in her shoulder. She reports being unable to fully raise her right arm. Pt has not taken any additional medications, states that this morning she took her diabetes medications and meloxicam. Pt states that she is having pain in her right shoulder that radiates down to her fingers. Pain is worse with movement and relieved with elbow flexion and shoulder adduction. With movement the pain is a 6/7. Pt describes pain on medial aspect of her right lower leg, just above her ankle, which she describes as stinging.   Pt also needs a refill of her metformin, as she is going out of town on 06/18/2015.   Review of Systems  Constitutional: Negative.   Respiratory: Negative.   Cardiovascular: Negative.   Gastrointestinal: Negative.   Skin: Negative.   Neurological: Negative for light-headedness and headaches.       Objective:   Physical Exam  Constitutional: She is oriented to person, place, and time. She appears well-developed and well-nourished. No distress.  Cardiovascular: Normal rate, regular rhythm, normal heart sounds and intact distal pulses.  Exam reveals no gallop and no friction rub.   No murmur heard. Capillary refill < 2 seconds   Pulmonary/Chest: Effort normal and breath sounds normal. No respiratory distress. She has no wheezes. She has no rales. She exhibits no tenderness.  Musculoskeletal: She exhibits tenderness. She exhibits no edema.  Right Wrist- no bony deformities, inflammation, or tenderness of bony prominences. Full ROM in carpal joints and with  supination/pronation.  Right shoulder - no bony deformities. Diffuse tenderness in rotator cuff and AC joint. Limited ROM in shoulder with adduction, abduction, and external rotation.   Right lower leg: no bony deformity or inflammation. tenderness to palpation of proximal fibula and distal tib/fib.  Neurological: She is alert and oriented to person, place, and time.  Skin: Skin is warm and dry. No rash noted. She is not diaphoretic. No erythema. No pallor.  Psychiatric: She has a normal mood and affect. Her behavior is normal. Judgment and thought content normal.      Assessment & Plan:

## 2015-06-13 NOTE — Progress Notes (Signed)
Patient ID: Cathy Fuller, female    DOB: [birth year], 54 y.o.   MRN: [id number]  PCP: Olene Floss  Subjective:   Chief Complaint  Patient presents with  . Leg Pain    post fall right side   . Hand Pain     post fall right   . Shoulder Pain    right   . Medication Refill    glipizide, meloxicam,losartan,famotidine,levothyroxin,cyclobenzapr,hydroco/acetamin,krilloil,tremeric/curcumin, milk thistle     HPI Presents for evaluation of shoulder and ankle pain after a fall this morning. She also needs labs-she hasn't been seen for her diabetes in a year. She is generally non-compliant with treatment recommendations.  Pt states that she was working in her raised garden bed this morning after the rain and right foot slid off of the garden bed and then fell on lateral aspect of right hand. Pt denies hitting her head or LOC. Pt able to ambulate after incident. Pt states that she tried catching herself on her wrist but it caused pain in her shoulder. She reports being unable to fully raise her right arm. Pt has not taken any additional medications, states that this morning she took her diabetes medications and meloxicam. Pt states that she is having pain in her right shoulder that radiates down to her fingers. Pain is worse with movement and relieved with elbow flexion and shoulder adduction. With movement the pain is a 6/7. Pt describes pain on medial aspect of her right lower leg, just above her ankle, which she describes as stinging.   Pt also needs a refill of her metformin, as she is going out of town on 06/18/2015.  Review of Systems Constitutional: Negative.  Respiratory: Negative.  Cardiovascular: Negative.  Gastrointestinal: Negative.  Skin: Negative.  Neurological: Negative for light-headedness and headaches.      Patient Active Problem List   Diagnosis Date Noted  . Obesity (BMI 30-39.9) 05/04/2014  . OSA (obstructive sleep apnea) 10/27/2013  . Hypertension  01/30/2012  . Hypothyroidism 01/30/2012  . DM (diabetes mellitus), type 2, uncontrolled 05/29/2010  . BARRETTS ESOPHAGUS 05/29/2010  . FATTY LIVER DISEASE 05/29/2010  . COLONIC POLYPS, ADENOMATOUS, HX OF 05/29/2010     Prior to Admission medications   Medication Sig Start Date End Date Taking? Authorizing Provider  cyclobenzaprine (FLEXERIL) 10 MG tablet Take 1 tablet (10 mg total) by mouth 3 (three) times daily as needed. 05/04/14  Yes Maryruth Apple, PA-C  famotidine (PEPCID) 20 MG tablet Take 20 mg by mouth 2 (two) times daily. 20 mg QAM, 40 mg QHS   Yes Historical Provider, MD  glipiZIDE (GLUCOTROL) 5 MG tablet Take 1 tablet (5 mg total) by mouth 2 (two) times daily before a meal. 06/09/15  Yes Marvetta Vohs, PA-C  Glucosamine-Chondroitin (JOINT SUPPORT PO) Take 1,000 mg by mouth 2 (two) times daily before a meal.   Yes Historical Provider, MD  HYDROcodone-acetaminophen (NORCO) 10-325 MG per tablet Take 1 tablet by mouth every 8 (eight) hours as needed. 06/13/15  Yes Olga Bourbeau, PA-C  ketoconazole (NIZORAL) 2 % cream Apply 1 application topically daily. 02/11/14  Yes Harrold Fitchett, PA-C  levothyroxine (SYNTHROID, LEVOTHROID) 25 MCG tablet Take 1 tablet (25 mcg total) by mouth daily. 05/04/14  Yes Jakita Dutkiewicz, PA-C  losartan-hydrochlorothiazide (HYZAAR) 100-12.5 MG per tablet Take 1 tablet by mouth daily. 05/04/14  Yes Tishawna Larouche, PA-C  meloxicam (MOBIC) 15 MG tablet TAKE ONE-HALF (1/2) TABLET TWICE A DAY OR 1 TABLET ONCE A DAY AS NEEDED FOR PAIN 06/13/15  Yes Maham Quintin, PA-C  metFORMIN (GLUCOPHAGE) 1000 MG tablet Take 1 tablet (1,000 mg total) by mouth 2 (two) times daily with a meal. 06/13/15  Yes Heitor Steinhoff, PA-C  metFORMIN (GLUCOPHAGE) 1000 MG tablet Take 1 tablet (1,000 mg total) by mouth 2 (two) times daily with a meal. 06/13/15  Yes Jamani Bearce, PA-C     Allergies  Allergen Reactions  . Aspirin   . Tape Other (See Comments)    Blisters       Objective:  Physical  Exam  Constitutional: She is oriented to person, place, and time. She appears well-developed and well-nourished. She is active and cooperative. No distress.  BP 124/80 mmHg  Pulse 98  Temp(Src) 98.6 F (37 C) (Oral)  Resp 17  Ht 5\' 4"  (1.626 m)  Wt 226 lb (102.513 kg)  BMI 38.77 kg/m2  SpO2 97%   Eyes: Conjunctivae are normal. No scleral icterus.  Neck: No thyromegaly present.  Cardiovascular: Normal rate, regular rhythm, normal heart sounds and intact distal pulses.   Pulmonary/Chest: Effort normal and breath sounds normal.  Musculoskeletal:       Right shoulder: She exhibits decreased range of motion, tenderness and pain. She exhibits no bony tenderness, no swelling, no deformity, no laceration, no spasm, normal pulse and normal strength.       Right elbow: Normal.      Right wrist: She exhibits tenderness. She exhibits normal range of motion, no bony tenderness, no swelling and no deformity.       Right knee: Normal.       Right ankle: Normal. Achilles tendon normal.       Right lower leg: She exhibits tenderness and bony tenderness. She exhibits no swelling, no edema, no deformity and no laceration.  Lymphadenopathy:    She has no cervical adenopathy.  Neurological: She is alert and oriented to person, place, and time.  Skin: Skin is warm and dry.  Psychiatric: She has a normal mood and affect. Her speech is normal and behavior is normal.       RIGHT TIB-FIB & Shoulder: [facility] reading (PRIMARY) by  Dr. Patsy Lager. No bony deformities noted of the shoulder or tib-fib.  Results for orders placed or performed in visit on 06/13/15  POCT glucose (manual entry)  Result Value Ref Range   POC Glucose 444 (A) 70 - 99 mg/dl  POCT glycosylated hemoglobin (Hb A1C)  Result Value Ref Range   Hemoglobin A1C 10.1          Assessment & Plan:   1. Pain in joint, shoulder region, right 2. Pain in joint, ankle and foot, right 5. Pain in joint, lower leg, right Contusion, strain of the  shoulder and lower leg. Rest. Elevation. Ice. - DG Tibia/Fibula Right; Future - DG Shoulder Right; Future   3. DM (diabetes mellitus), type 2, uncontrolled Uncontrolled. Increase glipizide from 5 mg BID to 10 mg BID. Continue efforts for healthy lifestyle changes. - metFORMIN (GLUCOPHAGE) 1000 MG tablet; Take 1 tablet (1,000 mg total) by mouth 2 (two) times daily with a meal.  Dispense: 180 tablet; Refill: 3 - metFORMIN (GLUCOPHAGE) 1000 MG tablet; Take 1 tablet (1,000 mg total) by mouth 2 (two) times daily with a meal.  Dispense: 30 tablet; Refill: 0 - POCT glucose (manual entry) - POCT glycosylated hemoglobin (Hb A1C) - Comprehensive metabolic panel - Lipid panel - Microalbumin, urine  4. Low back pain, unspecified back pain laterality, with sciatica presence unspecified Chronic. Unchanged. Continue PRN hydrocodone. - HYDROcodone-acetaminophen (  NORCO) 10-325 MG per tablet; Take 1 tablet by mouth every 8 (eight) hours as needed.  Dispense: 90 tablet; Refill: 0   6. Screening for HIV (human immunodeficiency virus) - HIV antibody    Fernande Bras, PA-C Physician Assistant-Certified [facility]

## 2015-06-14 ENCOUNTER — Other Ambulatory Visit: Payer: Self-pay | Admitting: Physician Assistant

## 2015-06-14 ENCOUNTER — Encounter: Payer: Self-pay | Admitting: Physician Assistant

## 2015-06-14 LAB — MICROALBUMIN, URINE: MICROALB UR: 0.4 mg/dL (ref <2.0)

## 2015-06-14 LAB — HIV ANTIBODY (ROUTINE TESTING W REFLEX): HIV: NONREACTIVE

## 2015-06-15 ENCOUNTER — Other Ambulatory Visit: Payer: Self-pay | Admitting: Physician Assistant

## 2015-06-15 ENCOUNTER — Telehealth: Payer: Self-pay

## 2015-06-15 DIAGNOSIS — E039 Hypothyroidism, unspecified: Secondary | ICD-10-CM

## 2015-06-15 MED ORDER — LEVOTHYROXINE SODIUM 25 MCG PO TABS: 25.0000 ug | ORAL_TABLET | Freq: Every day | ORAL | Status: DC

## 2015-06-15 NOTE — Telephone Encounter (Signed)
Pt was here recently but did not discuss thyroid. Can we refill?

## 2015-06-15 NOTE — Telephone Encounter (Signed)
Pt would like a refill on her levothyroxine (SYNTHROID, LEVOTHROID) 25 MCG tablet [[id number]] . She would like [place] to use the pharmacy on [address] at [place]. Please advise at [phone]

## 2015-06-15 NOTE — Telephone Encounter (Signed)
Her TSH has been stable for a long time with this dose.  Will refill for 6 months. Deliah Boston, MS, PA-C   5:24 PM, 06/15/2015

## 2015-06-23 ENCOUNTER — Encounter: Payer: Self-pay | Admitting: Physician Assistant

## 2015-07-12 ENCOUNTER — Ambulatory Visit (INDEPENDENT_AMBULATORY_CARE_PROVIDER_SITE_OTHER): Payer: BLUE CROSS/BLUE SHIELD | Admitting: Physician Assistant

## 2015-07-12 ENCOUNTER — Encounter: Payer: Self-pay | Admitting: Physician Assistant

## 2015-07-12 VITALS — BP 112/66 | HR 96 | Temp 98.4°F | Resp 16 | Ht 63.5 in | Wt 228.3 lb

## 2015-07-12 DIAGNOSIS — E669 Obesity, unspecified: Secondary | ICD-10-CM

## 2015-07-12 DIAGNOSIS — E039 Hypothyroidism, unspecified: Secondary | ICD-10-CM

## 2015-07-12 DIAGNOSIS — R079 Chest pain, unspecified: Secondary | ICD-10-CM | POA: Diagnosis not present

## 2015-07-12 DIAGNOSIS — M545 Low back pain: Secondary | ICD-10-CM | POA: Diagnosis not present

## 2015-07-12 DIAGNOSIS — Z23 Encounter for immunization: Secondary | ICD-10-CM | POA: Diagnosis not present

## 2015-07-12 DIAGNOSIS — I10 Essential (primary) hypertension: Secondary | ICD-10-CM

## 2015-07-12 DIAGNOSIS — IMO0002 Reserved for concepts with insufficient information to code with codable children: Secondary | ICD-10-CM

## 2015-07-12 DIAGNOSIS — Z0000 Encounter for general adult medical examination without abnormal findings: Secondary | ICD-10-CM

## 2015-07-12 DIAGNOSIS — E1165 Type 2 diabetes mellitus with hyperglycemia: Secondary | ICD-10-CM | POA: Diagnosis not present

## 2015-07-12 DIAGNOSIS — Z1159 Encounter for screening for other viral diseases: Secondary | ICD-10-CM

## 2015-07-12 LAB — TSH: TSH: 1.615 u[IU]/mL (ref 0.350–4.500)

## 2015-07-12 MED ORDER — LEVOTHYROXINE SODIUM 25 MCG PO TABS: 25.0000 ug | ORAL_TABLET | Freq: Every day | ORAL | Status: DC

## 2015-07-12 MED ORDER — HYDROCODONE-ACETAMINOPHEN 10-325 MG PO TABS: 1.0000 | ORAL_TABLET | Freq: Three times a day (TID) | ORAL | Status: DC | PRN

## 2015-07-12 MED ORDER — GLIPIZIDE 10 MG PO TABS: 10.0000 mg | ORAL_TABLET | Freq: Two times a day (BID) | ORAL | Status: DC

## 2015-07-12 MED ORDER — MELOXICAM 15 MG PO TABS: ORAL_TABLET | ORAL | Status: DC

## 2015-07-12 MED ORDER — LOSARTAN POTASSIUM-HCTZ 100-12.5 MG PO TABS: ORAL_TABLET | ORAL | Status: DC

## 2015-07-12 NOTE — Progress Notes (Signed)
Patient ID: Cathy Fuller, female    DOB: 1961-02-11, 54 y.o.   MRN: [id number]  PCP: Georgian Mcclory, PA-C  Chief Complaint  Patient presents with  . Annual Exam  . Medication Refill    Subjective:   HPI: Presents for annual wellness exam.  Needs vision exam. Has noticed reduced alibility to read, even with "cheaters." Also while in [place], experienced sharp stabbing pains in the LEFT upper abdomen. Occurred 4-5 times a day x 1 week, none for the past 1 week.  Muscle cramps in both legs. "My apron puts pressure on the obturator nerve." Wakes during the night with cramps in her legs that pull her toes under, but also ocurs at other times. The only thing that helps is to grab hold of my apron and pull it up to get the pressure off. Her Gastro doctor has recommended bariatric surgery, but "my husband let me have it. He said You can have a tummy tuck." He's also consented to her having a gastric sleeve. "I've tried everything to lose weight."  "I remember when I was younger my mom saying no guy is going to want to look at you."  Did not understand that I requested she increase the glipizide from 5 mg to 10 mg BID at her last visit.  Pocket of fluid in the low back where she had 2 level fusion in 2004. Has been told she's a good candidate for treatment of the disc problem she has, but only after the fluid is resolved.  S/p hysterectomy for uterine prolapse. No history of abnormal pap.  Patient Active Problem List   Diagnosis Date Noted  . Obesity (BMI 30-39.9) 05/04/2014  . OSA (obstructive sleep apnea) 10/27/2013  . Hypertension 01/30/2012  . Hypothyroidism 01/30/2012  . DM (diabetes mellitus), type 2, uncontrolled 05/29/2010  . BARRETTS ESOPHAGUS 05/29/2010  . FATTY LIVER DISEASE 05/29/2010  . COLONIC POLYPS, ADENOMATOUS, HX OF 05/29/2010    Past Medical History  Diagnosis Date  . GERD (gastroesophageal reflux disease)   . PUD (peptic ulcer disease)   . Anemia   .  Diabetes mellitus without complication   . Thyroid disease   . Blood transfusion without reported diagnosis   . Barrett's esophagus   . Esophageal stricture   . Fatty liver 2009  . Internal hemorrhoids   . NASH (nonalcoholic steatohepatitis)     grade 1 fibrosis  . Hx of adenomatous colonic polyps   . Hyperplastic colon polyp   . IBS (irritable bowel syndrome)   . Hepatitis A 1983  . Hypertension   . Hyperlipidemia   . Sleep apnea   . Neuromuscular disorder     MILD CASE OF MG - per patient not MS     Prior to Admission medications   Medication Sig Start Date End Date Taking? Authorizing Provider  cyclobenzaprine (FLEXERIL) 10 MG tablet Take 1 tablet (10 mg total) by mouth 3 (three) times daily as needed. 05/04/14  Yes Timarion Agcaoili, PA-C  famotidine (PEPCID) 20 MG tablet Take 20 mg by mouth 2 (two) times daily. 20 mg QAM, 40 mg QHS   Yes Historical Provider, MD  glipiZIDE (GLUCOTROL) 5 MG tablet Take 1 tablet (5 mg total) by mouth 2 (two) times daily before a meal. 06/09/15  Yes Zephan Beauchaine, PA-C  HYDROcodone-acetaminophen (NORCO) 10-325 MG per tablet Take 1 tablet by mouth every 8 (eight) hours as needed. 06/13/15  Yes Melbourne Jakubiak, PA-C  ketoconazole (NIZORAL) 2 % cream Apply 1 application topically daily.  02/11/14  Yes Paighton Godette, PA-C  levothyroxine (SYNTHROID, LEVOTHROID) 25 MCG tablet Take 1 tablet (25 mcg total) by mouth daily. 06/15/15  Yes Ofilia Neas, PA-C  losartan-hydrochlorothiazide (HYZAAR) 100-12.5 MG per tablet TAKE 1 TABLET DAILY 06/15/15  Yes Morrell Riddle, PA-C  meloxicam (MOBIC) 15 MG tablet TAKE ONE-HALF (1/2) TABLET TWICE A DAY OR 1 TABLET ONCE A DAY AS NEEDED FOR PAIN 06/13/15  Yes Dulce Martian, PA-C  metFORMIN (GLUCOPHAGE) 1000 MG tablet Take 1 tablet (1,000 mg total) by mouth 2 (two) times daily with a meal. 06/13/15  Yes Lareina Espino, PA-C  Glucosamine-Chondroitin (JOINT SUPPORT PO) Take 1,000 mg by mouth 2 (two) times daily before a meal.    Historical  Provider, MD  metFORMIN (GLUCOPHAGE) 1000 MG tablet Take 1 tablet (1,000 mg total) by mouth 2 (two) times daily with a meal. 06/13/15   Porfirio Oar, PA-C    Allergies  Allergen Reactions  . Aspirin   . Tape Other (See Comments)    Blisters    Past Surgical History  Procedure Laterality Date  . Breast biopsy    . Hernia repair    . Carpal tunnel release Bilateral   . Back surgery    . Cholecystectomy, laparoscopic  1998  . Abdominal hysterectomy    . Knee surgery Left   . Shoulder surgery Left   . Bladder surgery      x 2  . Cholecystectomy    . Colonoscopy      Family History  Problem Relation Age of Onset  . Thyroid disease Mother   . Stroke Mother   . Heart disease Mother   . Hyperlipidemia Mother   . Hypertension Mother   . Stroke Father   . Heart disease Father   . Hyperlipidemia Father   . Hypertension Father   . Diabetes Brother   . Colitis Daughter   . Colon cancer Maternal Uncle   . Stroke Maternal Grandmother   . Heart disease Maternal Grandfather     HEART ATTACK  . Heart disease Paternal Grandfather     HEART ATTACK    Social History   Social History  . Marital Status: Married    Spouse Name: N/A  . Number of Children: 2  . Years of Education: N/A   Occupational History  . Disabled     disabled   Social History Main Topics  . Smoking status: Never Smoker   . Smokeless tobacco: Never Used  . Alcohol Use: 0.5 - 1.0 oz/week    1-2 Standard drinks or equivalent per week     Comment: rare  . Drug Use: No  . Sexual Activity: Not Asked   Other Topics Concern  . None   Social History Narrative   Lives at home with husband and son.         Review of Systems  Constitutional: Negative.   HENT: Positive for dental problem, ear pain and trouble swallowing (feels like food gets caught and has to drink water to get it to go down. Present for years intermittently. "Cut and stretched" x 2). Negative for congestion, drooling, ear discharge,  facial swelling, hearing loss, mouth sores, nosebleeds, postnasal drip, rhinorrhea, sinus pressure, sneezing, sore throat, tinnitus and voice change.   Eyes: Positive for pain and visual disturbance. Negative for photophobia, discharge, redness and itching.       Feels like her glasses don't fit and needs a different prescription  Respiratory: Positive for chest tightness (for 1 day  5 days ago "felt like someone was sitting on me," discomfort into the neck bilaterally, felt it took more effort to breathe). Negative for apnea, cough, choking, shortness of breath, wheezing and stridor.   Cardiovascular: Positive for leg swelling (worse in the evenings, resolves overnight). Negative for chest pain and palpitations.  Gastrointestinal: Positive for nausea ("when I have issues with the LEFT kidney;" intermittent; pain can be severe. Taking at hot shower helps.) and abdominal pain (pain "builds up" in the LLQ). Negative for diarrhea, constipation, blood in stool, abdominal distention and anal bleeding.  Endocrine: Positive for polydipsia and polyuria. Negative for cold intolerance, heat intolerance and polyphagia.  Genitourinary: Positive for dysuria and dyspareunia. Negative for urgency, frequency, hematuria, flank pain, decreased urine volume, vaginal bleeding, vaginal discharge, enuresis, difficulty urinating, genital sores, vaginal pain, menstrual problem and pelvic pain.  Musculoskeletal: Positive for myalgias, back pain and arthralgias. Negative for joint swelling, gait problem, neck pain and neck stiffness.  Skin: Negative.   Allergic/Immunologic: Negative.   Neurological: Negative.   Hematological: Negative.   Psychiatric/Behavioral: Negative.         Objective:  Physical Exam  Constitutional: She is oriented to person, place, and time. Vital signs are normal. She appears well-developed and well-nourished. She is active and cooperative. No distress.  BP 112/66 mmHg  Pulse 96  Temp(Src) 98.4  F (36.9 C) (Oral)  Resp 16  Ht 5' 3.5" (1.613 m)  Wt 228 lb 4.8 oz (103.556 kg)  BMI 39.80 kg/m2   HENT:  Head: Normocephalic and atraumatic.  Right Ear: Hearing, tympanic membrane, external ear and ear canal normal. No foreign bodies.  Left Ear: Hearing, tympanic membrane, external ear and ear canal normal. No foreign bodies.  Nose: Nose normal.  Mouth/Throat: Uvula is midline, oropharynx is clear and moist and mucous membranes are normal. No oral lesions. Normal dentition. No dental abscesses or uvula swelling. No oropharyngeal exudate.  Eyes: Conjunctivae, EOM and lids are normal. Pupils are equal, round, and reactive to light. Right eye exhibits no discharge. Left eye exhibits no discharge. No scleral icterus.  Fundoscopic exam:      The right eye shows no arteriolar narrowing, no AV nicking, no exudate, no hemorrhage and no papilledema. The right eye shows red reflex.       The left eye shows no arteriolar narrowing, no AV nicking, no exudate, no hemorrhage and no papilledema. The left eye shows red reflex.  Neck: Trachea normal, normal range of motion and full passive range of motion without pain. Neck supple. No spinous process tenderness and no muscular tenderness present. No thyroid mass and no thyromegaly present.  Cardiovascular: Normal rate, regular rhythm, normal heart sounds, intact distal pulses and normal pulses.   Pulmonary/Chest: Effort normal and breath sounds normal.  Musculoskeletal: She exhibits no edema or tenderness.       Cervical back: Normal.       Thoracic back: Normal.       Lumbar back: Normal.  Lymphadenopathy:       Head (right side): No tonsillar, no preauricular, no posterior auricular and no occipital adenopathy present.       Head (left side): No tonsillar, no preauricular, no posterior auricular and no occipital adenopathy present.    She has no cervical adenopathy.       Right: No supraclavicular adenopathy present.       Left: No supraclavicular  adenopathy present.  Neurological: She is alert and oriented to person, place, and time. She has normal  strength and normal reflexes. No cranial nerve deficit. She exhibits normal muscle tone. Coordination and gait normal.  Skin: Skin is warm, dry and intact. No rash noted. She is not diaphoretic. No cyanosis or erythema. Nails show no clubbing.  Psychiatric: She has a normal mood and affect. Her speech is normal and behavior is normal. Judgment and thought content normal.           Assessment & Plan:  1. Annual physical exam Age appropriate anticipatory guidance provided.  2. DM (diabetes mellitus), type 2, uncontrolled Increase glipizide from 5 mg BID to 10 mg BID. Continue Metformin. Healthier eating choices. Increased exercise. If A1C remains above goal at next visit, add another agent. - glipiZIDE (GLUCOTROL) 10 MG tablet; Take 1 tablet (10 mg total) by mouth 2 (two) times daily before a meal.  Dispense: 180 tablet; Refill: 3  3. Essential hypertension Controlled. Continue current treatment. - losartan-hydrochlorothiazide (HYZAAR) 100-12.5 MG per tablet; TAKE 1 TABLET DAILY  Dispense: 90 tablet; Refill: 3  4. Hypothyroidism, unspecified hypothyroidism type Await lab. - TSH - levothyroxine (SYNTHROID, LEVOTHROID) 25 MCG tablet; Take 1 tablet (25 mcg total) by mouth daily.  Dispense: 90 tablet; Refill: 3  5. Obesity (BMI 30-39.9) She's interested in bariatric surgery to help with weight loss, though may also need an abdominoplasty as the large pannus causes nerve compression and results in numbness in her legs. - Ambulatory referral to General Surgery  6. Need for hepatitis C screening test - Hepatitis C antibody  7. Need for influenza vaccination - Flu Vaccine QUAD 36+ mos IM  8. Low back pain, unspecified back pain laterality, with sciatica presence unspecified She'll obtain the report of her most recent MRI so that we can determine how to proceed. She may benefit from  steroid injections. - meloxicam (MOBIC) 15 MG tablet; TAKE ONE-HALF (1/2) TABLET TWICE A DAY OR 1 TABLET ONCE A DAY AS NEEDED FOR PAIN  Dispense: 30 tablet; Refill: 0 - HYDROcodone-acetaminophen (NORCO) 10-325 MG per tablet; Take 1 tablet by mouth every 8 (eight) hours as needed.  Dispense: 90 tablet; Refill: 0  10. Chest pain, unspecified chest pain type Normal EKG here today, but given her other medical problems, the episode of chest pain is concerning. Refer to cardiology-likely needs stress test. - EKG 12-Lead - Ambulatory referral to Cardiology  RTC 3 months.   Fernande Bras, PA-C Physician Assistant-Certified [facility]

## 2015-07-12 NOTE — Patient Instructions (Addendum)
Keeping You Healthy  Get These Tests  Blood Pressure- Have your blood pressure checked by your healthcare provider at least once a year.  Normal blood pressure is 120/80.  Weight- Have your body mass index (BMI) calculated to screen for obesity.  BMI is a measure of body fat based on height and weight.  You can calculate your own BMI at [email or web address].com/bmi/  Cholesterol- Have your cholesterol checked every year.  Diabetes- Have your blood sugar checked every year if you have high blood pressure, high cholesterol, a family history of diabetes or if you are overweight.  Pap Test - Have a pap test every 1 to 5 years if you have been sexually active.  If you are older than 65 and recent pap tests have been normal you may not need additional pap tests.  In addition, if you have had a hysterectomy  for benign disease additional pap tests are not necessary.  Mammogram-Yearly mammograms are essential for early detection of breast cancer  Screening for Colon Cancer- Colonoscopy starting at age 50. Screening may begin sooner depending on your family history and other health conditions.  Follow up colonoscopy as directed by your Gastroenterologist.  Screening for Osteoporosis- Screening begins at age 65 with bone density scanning, sooner if you are at higher risk for developing Osteoporosis.  Get these medicines  Calcium with Vitamin D- Your body requires 1200-1500 mg of Calcium a day and 800-1000 IU of Vitamin D a day.  You can only absorb 500 mg of Calcium at a time therefore Calcium must be taken in 2 or 3 separate doses throughout the day.  Hormones- Hormone therapy has been associated with increased risk for certain cancers and heart disease.  Talk to your healthcare provider about if you need relief from menopausal symptoms.  Aspirin- Ask your healthcare provider about taking Aspirin to prevent Heart Disease and Stroke.  Get these Immuniztions  Flu shot- Every fall  Pneumonia shot-  Once after the age of 65; if you are younger ask your healthcare provider if you need a pneumonia shot.  Tetanus- Every ten years.  Zostavax- Once after the age of 60 to prevent shingles.  Take these steps  Don't smoke- Your healthcare provider can help you quit. For tips on how to quit, ask your healthcare provider or go to www.smokefree.gov or call 1-800 QUIT-NOW.  Be physically active- Exercise 5 days a week for a minimum of 30 minutes.  If you are not already physically active, start slow and gradually work up to 30 minutes of moderate physical activity.  Try walking, dancing, bike riding, swimming, etc.  Eat a healthy diet- Eat a variety of healthy foods such as fruits, vegetables, whole grains, low fat milk, low fat cheeses, yogurt, lean meats, chicken, fish, eggs, dried beans, tofu, etc.  For more information go to www.thenutritionsource.org  Dental visit- Brush and floss teeth twice daily; visit your dentist twice a year.  Eye exam- Visit your Optometrist or Ophthalmologist yearly.  Drink alcohol in moderation- Limit alcohol intake to one drink or less a day.  Never drink and drive.  Depression- Your emotional health is as important as your physical health.  If you're feeling down or losing interest in things you normally enjoy, please talk to your healthcare provider.  Seat Belts- can save your life; always wear one  Smoke/Carbon Monoxide detectors- These detectors need to be installed on the appropriate level of your home.  Replace batteries at least once a year.  Violence- If   anyone is threatening or hurting you, please tell your healthcare provider.  Living Will/ Health care power of attorney- Discuss with your healthcare provider and family.  Please try to find the report of your most recent MRI. If you can't find it, try to find the name of the facility where it was performed and I'll get it.

## 2015-07-13 LAB — HEPATITIS C ANTIBODY: HCV AB: NEGATIVE

## 2015-08-02 NOTE — Telephone Encounter (Signed)
no

## 2015-08-29 ENCOUNTER — Ambulatory Visit: Payer: Self-pay | Admitting: Cardiology

## 2015-10-18 ENCOUNTER — Ambulatory Visit (INDEPENDENT_AMBULATORY_CARE_PROVIDER_SITE_OTHER): Payer: BLUE CROSS/BLUE SHIELD | Admitting: Physician Assistant

## 2015-10-18 ENCOUNTER — Encounter: Payer: Self-pay | Admitting: Physician Assistant

## 2015-10-18 VITALS — BP 120/74 | HR 79 | Temp 98.7°F | Resp 16 | Ht 64.75 in | Wt 226.8 lb

## 2015-10-18 DIAGNOSIS — E669 Obesity, unspecified: Secondary | ICD-10-CM | POA: Diagnosis not present

## 2015-10-18 DIAGNOSIS — I10 Essential (primary) hypertension: Secondary | ICD-10-CM | POA: Diagnosis not present

## 2015-10-18 DIAGNOSIS — M545 Low back pain, unspecified: Secondary | ICD-10-CM

## 2015-10-18 DIAGNOSIS — E1165 Type 2 diabetes mellitus with hyperglycemia: Secondary | ICD-10-CM

## 2015-10-18 DIAGNOSIS — IMO0001 Reserved for inherently not codable concepts without codable children: Secondary | ICD-10-CM

## 2015-10-18 LAB — LIPID PANEL
CHOLESTEROL: 179 mg/dL (ref 125–200)
HDL: 46 mg/dL (ref >=46)
LDL Cholesterol: 86 mg/dL (ref <130)
Total CHOL/HDL Ratio: 3.9 Ratio (ref <=5.0)
Triglycerides: 234 mg/dL — ABNORMAL HIGH (ref <150)
VLDL: 47 mg/dL — ABNORMAL HIGH (ref <30)

## 2015-10-18 LAB — GLUCOSE, POCT (MANUAL RESULT ENTRY): POC GLUCOSE: 215 mg/dL — AB (ref 70–99)

## 2015-10-18 LAB — COMPREHENSIVE METABOLIC PANEL
ALK PHOS: 73 U/L (ref 33–130)
ALT: 37 U/L — AB (ref 6–29)
AST: 30 U/L (ref 10–35)
Albumin: 4.2 g/dL (ref 3.6–5.1)
BILIRUBIN TOTAL: 0.4 mg/dL (ref 0.2–1.2)
BUN: 23 mg/dL (ref 7–25)
CALCIUM: 9.5 mg/dL (ref 8.6–10.4)
CO2: 25 mmol/L (ref 20–31)
Chloride: 103 mmol/L (ref 98–110)
Creat: 1.21 mg/dL — ABNORMAL HIGH (ref 0.50–1.05)
GLUCOSE: 210 mg/dL — AB (ref 65–99)
POTASSIUM: 4.4 mmol/L (ref 3.5–5.3)
Sodium: 137 mmol/L (ref 135–146)
TOTAL PROTEIN: 7 g/dL (ref 6.1–8.1)

## 2015-10-18 LAB — POCT GLYCOSYLATED HEMOGLOBIN (HGB A1C): Hemoglobin A1C: 9.5

## 2015-10-18 MED ORDER — MELOXICAM 15 MG PO TABS: ORAL_TABLET | ORAL | Status: DC

## 2015-10-18 MED ORDER — CYCLOBENZAPRINE HCL 10 MG PO TABS: 10.0000 mg | ORAL_TABLET | Freq: Three times a day (TID) | ORAL | Status: DC | PRN

## 2015-10-18 MED ORDER — HYDROCODONE-ACETAMINOPHEN 10-325 MG PO TABS: 1.0000 | ORAL_TABLET | Freq: Three times a day (TID) | ORAL | Status: DC | PRN

## 2015-10-18 MED ORDER — DAPAGLIFLOZIN PROPANEDIOL 5 MG PO TABS: 5.0000 mg | ORAL_TABLET | Freq: Every day | ORAL | Status: DC

## 2015-10-18 NOTE — Progress Notes (Signed)
Patient ID: Cathy Fuller, female    DOB: September 27, 1961, 54 y.o.   MRN: [id number]  PCP: Olene Floss  Subjective:   Chief Complaint  Patient presents with  . Follow-up    3 mos  . Diabetes    HPI Presents for evaluation of uncontrolled diabetes.  At her last visit, her CPE in August, glipizide was increased.  At her CPE in August, she was referred to cardiology, but she cancelled the appointment with Dr. Anne Fu. Her husband was out on medical leave, and she couldn't afford it. She was also referred to General Surgery for consultation on bariatric surgery and abdominoplasty to remove the large pannus that may be causing nerve compression and resulting in LE paresthesias.  She reports that she attended the class. Her insurance requires that she have 1 year of supervised weight loss. In addition, her husband doesn't want her to have the surgery, even though it could resolve her diabetes, HTN, OSA and help her back pain and GERD. She expresses great interest and sounds motivated, but then doesn't follow through with action.  She is "watching" what she eats. Has never seen a nutritionist or had diabetes education.  Continues to have lower extremity numbness and tingling, with falls due to what she perceives as a foot drop. She doesn't want to go back to any of her previous providers, but is interested in a "decompression" of the nerve impingement above her fusion at L3-4 and L4-5. Reports that she has all of her records, including MRIs and plain films, but doesn't know who to see here. Again, expresses that these symptoms are very problematic for her but then hassn't followed through with action.  Frequency of home glucose monitoring: not checking regularly Does not sees  dentist Q6 months, has not seen eye specialist this year. Checks feet daily. Is current with influenza vaccine. Is current with pneumococcal vaccine.   Review of Systems  Gastrointestinal: Positive for nausea  (sometimes after taking metformin and glipizide) and diarrhea (sometimes, seems associated with metformin and glipizide).  Endocrine: Negative for polydipsia (I always have a bottle of water with me, I'm always sipping on it) and polyuria (2-3 episodes of nocturia).  Genitourinary: Positive for urgency and frequency. Negative for dysuria.  Musculoskeletal: Positive for myalgias (nighttime cramps during the night), back pain and arthralgias (LEFT hip).  Neurological: Positive for numbness (lower extremities, a few falls due to not picking feet up, L>R; neurosurgery eval with MRI reveals bulging disc above her fusion, but needs fluid accumulation drained before  can have surgery, doesn't want to see the same doctor, "I don't know who to see) and headaches (sometimes associated with taking metformin and glipizide, sometimes with neck tension).       Patient Active Problem List   Diagnosis Date Noted  . Obesity (BMI 30-39.9) 05/04/2014  . OSA (obstructive sleep apnea) 10/27/2013  . Hypertension 01/30/2012  . Hypothyroidism 01/30/2012  . DM (diabetes mellitus), type 2, uncontrolled (HCC) 05/29/2010  . BARRETTS ESOPHAGUS 05/29/2010  . FATTY LIVER DISEASE 05/29/2010  . COLONIC POLYPS, ADENOMATOUS, HX OF 05/29/2010     Prior to Admission medications   Medication Sig Start Date End Date Taking? Authorizing Provider  cyclobenzaprine (FLEXERIL) 10 MG tablet Take 1 tablet (10 mg total) by mouth 3 (three) times daily as needed. 05/04/14   Fredi Hurtado, PA-C  famotidine (PEPCID) 20 MG tablet Take 20 mg by mouth 2 (two) times daily. 20 mg QAM, 40 mg QHS    Historical Provider,  MD  glipiZIDE (GLUCOTROL) 10 MG tablet Take 1 tablet (10 mg total) by mouth 2 (two) times daily before a meal. 07/12/15   Victorya Hillman, PA-C  Glucosamine-Chondroitin (JOINT SUPPORT PO) Take 1,000 mg by mouth 2 (two) times daily before a meal.    Historical Provider, MD  HYDROcodone-acetaminophen (NORCO) 10-325 MG per tablet  Take 1 tablet by mouth every 8 (eight) hours as needed. 07/12/15   Jammal Sarr, PA-C  ketoconazole (NIZORAL) 2 % cream Apply 1 application topically daily. 02/11/14   Amsi Grimley, PA-C  levothyroxine (SYNTHROID, LEVOTHROID) 25 MCG tablet Take 1 tablet (25 mcg total) by mouth daily. 07/12/15   Malacki Mcphearson, PA-C  losartan-hydrochlorothiazide (HYZAAR) 100-12.5 MG per tablet TAKE 1 TABLET DAILY 07/12/15   Tamre Cass, PA-C  meloxicam (MOBIC) 15 MG tablet TAKE ONE-HALF (1/2) TABLET TWICE A DAY OR 1 TABLET ONCE A DAY AS NEEDED FOR PAIN 07/12/15   Amiri Riechers, PA-C  metFORMIN (GLUCOPHAGE) 1000 MG tablet Take 1 tablet (1,000 mg total) by mouth 2 (two) times daily with a meal. 06/13/15   Porfirio Oarhelle Otto Caraway, PA-C     Allergies  Allergen Reactions  . Aspirin   . Tape Other (See Comments)    Blisters       Objective:  Physical Exam  Constitutional: She is oriented to person, place, and time. Vital signs are normal. She appears well-developed and well-nourished. She is active and cooperative. No distress.  BP 120/74 mmHg  Pulse 79  Temp(Src) 98.7 F (37.1 C) (Oral)  Resp 16  Ht 5' 4.75" (1.645 m)  Wt 226 lb 12.8 oz (102.876 kg)  BMI 38.02 kg/m2  SpO2 98%  HENT:  Head: Normocephalic and atraumatic.  Right Ear: Hearing normal.  Left Ear: Hearing normal.  Eyes: Conjunctivae are normal. No scleral icterus.  Neck: Normal range of motion and phonation normal. Neck supple. No thyromegaly present.  Cardiovascular: Normal rate, regular rhythm, normal heart sounds and intact distal pulses.   Pulses:      Radial pulses are 2+ on the right side, and 2+ on the left side.  Bilateral 1+ pitting edema  Pulmonary/Chest: Effort normal and breath sounds normal.  Musculoskeletal:       Lumbar back: She exhibits tenderness, bony tenderness and pain. She exhibits normal range of motion, no swelling and no spasm.       Arms: Lymphadenopathy:       Head (right side): No tonsillar, no preauricular, no  posterior auricular and no occipital adenopathy present.       Head (left side): No tonsillar, no preauricular, no posterior auricular and no occipital adenopathy present.    She has no cervical adenopathy.       Right: No supraclavicular adenopathy present.       Left: No supraclavicular adenopathy present.  Neurological: She is alert and oriented to person, place, and time. She has normal strength. No sensory deficit.  Reflex Scores:      Patellar reflexes are 2+ on the right side and 2+ on the left side.      Achilles reflexes are 2+ on the right side. No ankle clonus  Skin: Skin is warm, dry and intact. No rash noted. No cyanosis or erythema. Nails show no clubbing.  Psychiatric: She has a normal mood and affect. Her speech is normal and behavior is normal. Judgment and thought content normal. Cognition and memory are normal.  Quite verbose   Diabetic Foot Exam - Simple   Simple Foot Form  Diabetic  Foot exam was performed with the following findings:  Yes 10/18/2015  9:40 AM  Visual Inspection  Sensation Testing  Pulse Check  Comments  Pedal pulses felt on the right foot, could not feel pulse on the left foot     Results for orders placed or performed in visit on 10/18/15  POCT glucose (manual entry)  Result Value Ref Range   POC Glucose 215 (A) 70 - 99 mg/dl  POCT glycosylated hemoglobin (Hb A1C)  Result Value Ref Range   Hemoglobin A1C 9.5            Assessment & Plan:   1. Uncontrolled type 2 diabetes mellitus without complication, without long-term current use of insulin (HCC) Slightly improved (A1C is down from 10.1 to 9.5), but still totally uncontrolled. Continue metformin and glipizide. Add [place]. Refer to diabetes education/nutrition counseling.  - HM Diabetes Foot Exam - Ambulatory referral to diabetic education - POCT glucose (manual entry) - POCT glycosylated hemoglobin (Hb A1C) - Comprehensive metabolic panel - Lipid panel - dapagliflozin propanediol  (FARXIGA) 5 MG TABS tablet; Take 5 mg by mouth daily.  Dispense: 90 tablet; Refill: 3  2. Essential hypertension Controlled. Continue current treatment. - Comprehensive metabolic panel  3. Obesity (BMI 30-39.9) Diabetes education and nutrition counseling. - Ambulatory referral to diabetic education  4. Low back pain associated with a spinal disorder other than radiculopathy or spinal stenosis She understands that her records will need to be evaluated before the appointment can be made. - Ambulatory referral to Neurosurgery - meloxicam (MOBIC) 15 MG tablet; TAKE ONE-HALF (1/2) TABLET TWICE A DAY OR 1 TABLET ONCE A DAY AS NEEDED FOR PAIN  Dispense: 30 tablet; Refill: 0 - HYDROcodone-acetaminophen (NORCO) 10-325 MG tablet; Take 1 tablet by mouth every 8 (eight) hours as needed.  Dispense: 90 tablet; Refill: 0 - cyclobenzaprine (FLEXERIL) 10 MG tablet; Take 1 tablet (10 mg total) by mouth 3 (three) times daily as needed.  Dispense: 90 tablet; Refill: 1   Return in about 3 months (around 01/16/2016) for re-evaluation of diabetes.   Fernande Bras, PA-C Physician Assistant-Certified [facility]

## 2015-10-18 NOTE — Patient Instructions (Signed)
Please check your glucose twice each week. Once when you haven't had anything to eat or drink (except water) for 8-12 hours, and once 2-3 hours after your largest meal of the day. Record the results.  Also, please check your glucose when you are feeling the headache and nausea.

## 2015-11-22 ENCOUNTER — Ambulatory Visit: Payer: Self-pay

## 2015-11-24 DIAGNOSIS — Z6838 Body mass index (BMI) 38.0-38.9, adult: Secondary | ICD-10-CM | POA: Diagnosis not present

## 2015-11-24 DIAGNOSIS — M5416 Radiculopathy, lumbar region: Secondary | ICD-10-CM | POA: Diagnosis not present

## 2015-11-24 DIAGNOSIS — M4317 Spondylolisthesis, lumbosacral region: Secondary | ICD-10-CM | POA: Diagnosis not present

## 2015-11-24 DIAGNOSIS — M4727 Other spondylosis with radiculopathy, lumbosacral region: Secondary | ICD-10-CM | POA: Diagnosis not present

## 2015-11-28 ENCOUNTER — Other Ambulatory Visit: Payer: Self-pay | Admitting: Neurological Surgery

## 2015-11-28 DIAGNOSIS — M4317 Spondylolisthesis, lumbosacral region: Secondary | ICD-10-CM

## 2015-11-29 ENCOUNTER — Ambulatory Visit: Payer: Self-pay

## 2015-12-02 ENCOUNTER — Inpatient Hospital Stay: Admission: RE | Admit: 2015-12-02 | Payer: Self-pay | Source: Ambulatory Visit

## 2015-12-06 ENCOUNTER — Ambulatory Visit: Payer: Self-pay

## 2015-12-06 ENCOUNTER — Ambulatory Visit
Admission: RE | Admit: 2015-12-06 | Discharge: 2015-12-06 | Disposition: A | Discharge status: Home / Self Care | Payer: BLUE CROSS/BLUE SHIELD | Source: Ambulatory Visit | Attending: Neurological Surgery | Admitting: Neurological Surgery

## 2015-12-06 DIAGNOSIS — M4806 Spinal stenosis, lumbar region: Secondary | ICD-10-CM | POA: Diagnosis not present

## 2015-12-06 DIAGNOSIS — M4317 Spondylolisthesis, lumbosacral region: Secondary | ICD-10-CM

## 2015-12-08 ENCOUNTER — Other Ambulatory Visit: Payer: Self-pay | Admitting: Neurological Surgery

## 2015-12-08 DIAGNOSIS — M4727 Other spondylosis with radiculopathy, lumbosacral region: Secondary | ICD-10-CM | POA: Diagnosis not present

## 2015-12-08 DIAGNOSIS — M4317 Spondylolisthesis, lumbosacral region: Secondary | ICD-10-CM | POA: Diagnosis not present

## 2015-12-19 ENCOUNTER — Other Ambulatory Visit (HOSPITAL_COMMUNITY): Payer: Self-pay | Admitting: *Deleted

## 2015-12-19 NOTE — Pre-Procedure Instructions (Signed)
Bernetha Anschutz  12/19/2015      [id number] - [place], [place] - [address] [place] [address] Phone: [phone] Fax: [phone]  [facility] - [place], [place] - [address] [place] [address] Phone: [phone] Fax: [phone]  [facility] - [place], [place] - [address] [place] [address] Phone: [phone] Fax: [phone]    Your procedure is scheduled on Tuesday February 14th  Report to [facility] Admitting at [employer] this number if you have problems the morning of surgery:  [phone]   Remember:  Do not eat food or drink liquids after midnight Monday Feb. 13  Take these medicines the morning of surgery with A SIP OF WATER: Cyclobenzaprine (Flexeril) if needed, Famotidine (Pepcid), Hydrocodone if needed, Levothyroxine (Synthroid)  What do I do about my diabetes medications?   Do not take oral diabetes medicines (pills) the morning of surgery. Do not take [place], Glipizide or Metformin morning of surgery   THE NIGHT BEFORE SURGERY, Do not take evening dose of Gilpizide   Do not take aspirin, or aspirin containing products, NSAIDS, Ibuprofen (advil, motrin), Naproxen (aleve), vitamins or herbal medications for 5-7 days prior to surgery.   Do not wear jewelry, make-up or nail polish.  Do not wear lotions, powders, or perfumes.    Do not shave 48 hours prior to surgery.     Do not bring valuables to the hospital.  [facility] is not responsible for any belongings or valuables.  Contacts, dentures or bridgework may not be worn into surgery.  Leave your suitcase in the car.  After surgery it may be brought to your room.  For patients admitted to the hospital, discharge time will be determined by your treatment team.  Patients discharged the day of surgery will not be allowed to drive home.     Special instructions:  See attached Please read over the following fact sheets that you were given. Pain Booklet, Coughing and Deep Breathing, Blood Transfusion Information, MRSA Information and Surgical Site Infection Prevention   How to Manage Your Diabetes Before Surgery   Why is it important to control my blood sugar before and after surgery?   Improving blood sugar levels before and after surgery helps healing and can limit problems.  A way of improving blood sugar control is eating a healthy diet by:  - Eating less sugar and carbohydrates  - Increasing activity/exercise  - Talk with your doctor about reaching your blood sugar goals  High blood sugars (greater than 180 mg/dL) can raise your risk of infections and slow down your recovery so you will need to focus on controlling your diabetes during the weeks before surgery.  Make sure that the doctor who takes care of your diabetes knows about your planned surgery including the date and location.  How do I manage my blood sugars before surgery?   Check your blood sugar at least 4 times a day, 2 days before surgery to make sure that they are not too high or low.   Check your blood sugar the morning of your surgery when you wake up and every 2  hours until you get to the Short-Stay unit.  If your blood sugar is less than 70 mg/dL, you will need to treat for low blood sugar by:  Treat a low blood sugar (less  than 70 mg/dL) with 1/2 cup of clear juice (cranberry or apple), 4 glucose tablets, OR glucose gel.  Recheck blood sugar in 15 minutes after treatment (to make sure it is greater than 70 mg/dL).  If blood sugar is not greater than 70 mg/dL on re-check, call [phone] for further instructions.   Report your blood sugar to the Short-Stay nurse when you get to Short-Stay.  References:  [facility], 2007 "How to Manage your Diabetes Before and After  Surgery".

## 2015-12-20 ENCOUNTER — Encounter (HOSPITAL_COMMUNITY)
Admission: RE | Admit: 2015-12-20 | Discharge: 2015-12-20 | Disposition: A | Discharge status: Home / Self Care | Payer: BLUE CROSS/BLUE SHIELD | Source: Ambulatory Visit | Attending: Neurological Surgery | Admitting: Neurological Surgery

## 2015-12-20 ENCOUNTER — Encounter (HOSPITAL_COMMUNITY): Payer: Self-pay

## 2015-12-20 DIAGNOSIS — Z0183 Encounter for blood typing: Secondary | ICD-10-CM | POA: Insufficient documentation

## 2015-12-20 DIAGNOSIS — K7581 Nonalcoholic steatohepatitis (NASH): Secondary | ICD-10-CM | POA: Diagnosis not present

## 2015-12-20 DIAGNOSIS — M4316 Spondylolisthesis, lumbar region: Secondary | ICD-10-CM | POA: Insufficient documentation

## 2015-12-20 DIAGNOSIS — Z79899 Other long term (current) drug therapy: Secondary | ICD-10-CM | POA: Diagnosis not present

## 2015-12-20 DIAGNOSIS — Z01812 Encounter for preprocedural laboratory examination: Secondary | ICD-10-CM | POA: Insufficient documentation

## 2015-12-20 DIAGNOSIS — E119 Type 2 diabetes mellitus without complications: Secondary | ICD-10-CM | POA: Insufficient documentation

## 2015-12-20 DIAGNOSIS — Z7984 Long term (current) use of oral hypoglycemic drugs: Secondary | ICD-10-CM | POA: Insufficient documentation

## 2015-12-20 DIAGNOSIS — E039 Hypothyroidism, unspecified: Secondary | ICD-10-CM | POA: Insufficient documentation

## 2015-12-20 DIAGNOSIS — Z01818 Encounter for other preprocedural examination: Secondary | ICD-10-CM | POA: Diagnosis not present

## 2015-12-20 DIAGNOSIS — K219 Gastro-esophageal reflux disease without esophagitis: Secondary | ICD-10-CM | POA: Insufficient documentation

## 2015-12-20 HISTORY — DX: Hypothyroidism, unspecified: E03.9

## 2015-12-20 HISTORY — DX: Adverse effect of unspecified anesthetic, initial encounter: T41.45XA

## 2015-12-20 HISTORY — DX: Unspecified symptoms and signs involving the nervous system: R29.90

## 2015-12-20 HISTORY — DX: Other complications of anesthesia, initial encounter: T88.59XA

## 2015-12-20 LAB — CBC
HCT: 36.2 % (ref 36.0–46.0)
Hemoglobin: 11.9 g/dL — ABNORMAL LOW (ref 12.0–15.0)
MCH: 31 pg (ref 26.0–34.0)
MCHC: 32.9 g/dL (ref 30.0–36.0)
MCV: 94.3 fL (ref 78.0–100.0)
PLATELETS: 185 10*3/uL (ref 150–400)
RBC: 3.84 MIL/uL — AB (ref 3.87–5.11)
RDW: 14 % (ref 11.5–15.5)
WBC: 7.9 10*3/uL (ref 4.0–10.5)

## 2015-12-20 LAB — COMPREHENSIVE METABOLIC PANEL
ALBUMIN: 4 g/dL (ref 3.5–5.0)
ALT: 67 U/L — AB (ref 14–54)
AST: 67 U/L — AB (ref 15–41)
Alkaline Phosphatase: 72 U/L (ref 38–126)
Anion gap: 11 (ref 5–15)
BUN: 22 mg/dL — AB (ref 6–20)
CHLORIDE: 103 mmol/L (ref 101–111)
CO2: 26 mmol/L (ref 22–32)
CREATININE: 1.32 mg/dL — AB (ref 0.44–1.00)
Calcium: 9.7 mg/dL (ref 8.9–10.3)
GFR calc Af Amer: 51 mL/min — ABNORMAL LOW (ref >60)
GFR calc non Af Amer: 44 mL/min — ABNORMAL LOW (ref >60)
GLUCOSE: 164 mg/dL — AB (ref 65–99)
Potassium: 4.5 mmol/L (ref 3.5–5.1)
SODIUM: 140 mmol/L (ref 135–145)
Total Bilirubin: 0.8 mg/dL (ref 0.3–1.2)
Total Protein: 7.5 g/dL (ref 6.5–8.1)

## 2015-12-20 LAB — TYPE AND SCREEN
ABO/RH(D): A POS
ANTIBODY SCREEN: NEGATIVE

## 2015-12-20 LAB — ABO/RH: ABO/RH(D): A POS

## 2015-12-20 LAB — SURGICAL PCR SCREEN
MRSA, PCR: NEGATIVE
Staphylococcus aureus: NEGATIVE

## 2015-12-20 LAB — GLUCOSE, CAPILLARY: GLUCOSE-CAPILLARY: 174 mg/dL — AB (ref 65–99)

## 2015-12-20 NOTE — Progress Notes (Signed)
PCP Theora Gianotti, PA @ [facility]  Cardiologist: Dr. Harvie Junior in [place], [place]. Request stress test - done 4 ? Yrs ago/notes/ekg Does not see a cardiologist at present.

## 2015-12-21 LAB — HEMOGLOBIN A1C
HEMOGLOBIN A1C: 11 % — AB (ref 4.8–5.6)
MEAN PLASMA GLUCOSE: 269 mg/dL

## 2015-12-21 NOTE — Progress Notes (Addendum)
Anesthesia Chart Review:  Pt is a 55 year old female scheduled for L2-3 lateral lumbar interbody fusion (right side) on 12/27/2015 with Dr. Bevely Palmer.   PMH includes:  DM, anemia, hypothyroidism, nonalcoholic steatohepatitis, hepatitis A (1983), GERD. Never smoker. BMI 38  Medications include: dapagliflozin, pepcid, glipizide, levothyroxine, losartan-hctz, metformin.   Preoperative labs reviewed.  Glucose 164, hgbA1c 11.0  EKG 07/12/15: sinus rhythm.   Pt reports having stress test in [place] over 4 years ago. Pt reports it was normal. Attempting to get records.   Rica Mast, FNP-BC [facility] Short Stay Surgical Center/Anesthesiology Phone: [phone] 12/21/2015 4:00 PM  Addendum:   Spoke with [facility] in [place], [place]. Records in their office only go back 10 years; no records on this patient since 2007. Likely stress test result, which pt reports was normal, is in storage.   Given information available, if no changes, I anticipate pt can proceed with surgery as scheduled.   Rica Mast, FNP-BC [facility] Short Stay Surgical Center/Anesthesiology Phone: [phone] 12/22/2015 4:00 PM

## 2015-12-26 MED ORDER — CEFAZOLIN SODIUM-DEXTROSE 2-3 GM-% IV SOLR
2.0000 g | INTRAVENOUS | Status: AC
  Administered 2015-12-27: 2 g via INTRAVENOUS
  Filled 2015-12-26: qty 50

## 2015-12-26 NOTE — Anesthesia Preprocedure Evaluation (Signed)
Anesthesia Evaluation  Patient identified by MRN, date of birth, ID band Patient awake    Reviewed: Allergy & Precautions, H&P , NPO status , Patient's Chart, lab work & pertinent test results  Airway Mallampati: II  TM Distance: >3 FB Neck ROM: full    Dental no notable dental hx. (+) Dental Advisory Given, Teeth Intact   Pulmonary neg pulmonary ROS, sleep apnea ,    Pulmonary exam normal breath sounds clear to auscultation       Cardiovascular Exercise Tolerance: Good hypertension, Pt. on medications Normal cardiovascular exam Rhythm:regular Rate:Normal     Neuro/Psych Mild case of myasthenia gravis. Drug induced stroke  Neuromuscular disease negative psych ROS   GI/Hepatic negative GI ROS, Neg liver ROS, GERD  Medicated and Controlled,(+) Hepatitis -, AFatty liver Barrett's esophagus   Endo/Other  diabetes, Well Controlled, Type 2, Oral Hypoglycemic AgentsHypothyroidism   Renal/GU negative Renal ROS  negative genitourinary   Musculoskeletal   Abdominal   Peds  Hematology negative hematology ROS (+)   Anesthesia Other Findings   Reproductive/Obstetrics negative OB ROS                             Anesthesia Physical Anesthesia Plan  ASA: III  Anesthesia Plan: General   Post-op Pain Management:    Induction: Intravenous  Airway Management Planned: Oral ETT  Additional Equipment:   Intra-op Plan:   Post-operative Plan: Extubation in OR  Informed Consent: I have reviewed the patients History and Physical, chart, labs and discussed the procedure including the risks, benefits and alternatives for the proposed anesthesia with the patient or authorized representative who has indicated his/her understanding and acceptance.   Dental Advisory Given  Plan Discussed with: CRNA and Surgeon  Anesthesia Plan Comments:         Anesthesia Quick Evaluation

## 2015-12-26 NOTE — Progress Notes (Signed)
Pt already made aware to arrive at 5:30 A.M. on DOS.

## 2015-12-27 ENCOUNTER — Inpatient Hospital Stay (HOSPITAL_COMMUNITY): Payer: BLUE CROSS/BLUE SHIELD | Admitting: Certified Registered Nurse Anesthetist

## 2015-12-27 ENCOUNTER — Encounter (HOSPITAL_COMMUNITY)
Admission: RE | Disposition: A | Discharge status: Home / Self Care | Payer: Self-pay | Source: Ambulatory Visit | Attending: Neurological Surgery

## 2015-12-27 ENCOUNTER — Inpatient Hospital Stay (HOSPITAL_COMMUNITY): Payer: BLUE CROSS/BLUE SHIELD

## 2015-12-27 ENCOUNTER — Encounter (HOSPITAL_COMMUNITY): Payer: Self-pay | Admitting: Anesthesiology

## 2015-12-27 ENCOUNTER — Inpatient Hospital Stay (HOSPITAL_COMMUNITY)
Admission: RE | Admit: 2015-12-27 | Discharge: 2015-12-28 | DRG: 460 | Disposition: A | Discharge status: Home / Self Care | Payer: BLUE CROSS/BLUE SHIELD | Source: Ambulatory Visit | Attending: Neurological Surgery | Admitting: Neurological Surgery

## 2015-12-27 ENCOUNTER — Inpatient Hospital Stay (HOSPITAL_COMMUNITY): Payer: BLUE CROSS/BLUE SHIELD | Admitting: Emergency Medicine

## 2015-12-27 DIAGNOSIS — G4730 Sleep apnea, unspecified: Secondary | ICD-10-CM | POA: Diagnosis present

## 2015-12-27 DIAGNOSIS — R739 Hyperglycemia, unspecified: Secondary | ICD-10-CM

## 2015-12-27 DIAGNOSIS — Z981 Arthrodesis status: Secondary | ICD-10-CM | POA: Insufficient documentation

## 2015-12-27 DIAGNOSIS — M4316 Spondylolisthesis, lumbar region: Secondary | ICD-10-CM | POA: Diagnosis not present

## 2015-12-27 DIAGNOSIS — K219 Gastro-esophageal reflux disease without esophagitis: Secondary | ICD-10-CM | POA: Diagnosis not present

## 2015-12-27 DIAGNOSIS — M4317 Spondylolisthesis, lumbosacral region: Secondary | ICD-10-CM | POA: Diagnosis not present

## 2015-12-27 DIAGNOSIS — Z7984 Long term (current) use of oral hypoglycemic drugs: Secondary | ICD-10-CM

## 2015-12-27 DIAGNOSIS — I10 Essential (primary) hypertension: Secondary | ICD-10-CM | POA: Diagnosis present

## 2015-12-27 DIAGNOSIS — M5416 Radiculopathy, lumbar region: Secondary | ICD-10-CM | POA: Diagnosis present

## 2015-12-27 DIAGNOSIS — M4806 Spinal stenosis, lumbar region: Secondary | ICD-10-CM | POA: Diagnosis present

## 2015-12-27 DIAGNOSIS — I129 Hypertensive chronic kidney disease with stage 1 through stage 4 chronic kidney disease, or unspecified chronic kidney disease: Secondary | ICD-10-CM | POA: Insufficient documentation

## 2015-12-27 DIAGNOSIS — K7581 Nonalcoholic steatohepatitis (NASH): Secondary | ICD-10-CM | POA: Diagnosis present

## 2015-12-27 DIAGNOSIS — E119 Type 2 diabetes mellitus without complications: Secondary | ICD-10-CM | POA: Diagnosis present

## 2015-12-27 DIAGNOSIS — E039 Hypothyroidism, unspecified: Secondary | ICD-10-CM | POA: Diagnosis present

## 2015-12-27 DIAGNOSIS — M549 Dorsalgia, unspecified: Secondary | ICD-10-CM | POA: Diagnosis not present

## 2015-12-27 DIAGNOSIS — Z419 Encounter for procedure for purposes other than remedying health state, unspecified: Secondary | ICD-10-CM

## 2015-12-27 DIAGNOSIS — E118 Type 2 diabetes mellitus with unspecified complications: Secondary | ICD-10-CM

## 2015-12-27 DIAGNOSIS — K589 Irritable bowel syndrome without diarrhea: Secondary | ICD-10-CM | POA: Diagnosis not present

## 2015-12-27 DIAGNOSIS — M4326 Fusion of spine, lumbar region: Secondary | ICD-10-CM | POA: Diagnosis not present

## 2015-12-27 DIAGNOSIS — M545 Low back pain: Secondary | ICD-10-CM

## 2015-12-27 DIAGNOSIS — E1122 Type 2 diabetes mellitus with diabetic chronic kidney disease: Secondary | ICD-10-CM | POA: Insufficient documentation

## 2015-12-27 DIAGNOSIS — E785 Hyperlipidemia, unspecified: Secondary | ICD-10-CM | POA: Diagnosis present

## 2015-12-27 HISTORY — PX: ANTERIOR LAT LUMBAR FUSION: SHX1168

## 2015-12-27 HISTORY — DX: Spondylolisthesis, lumbar region: M43.16

## 2015-12-27 LAB — GLUCOSE, CAPILLARY
GLUCOSE-CAPILLARY: 198 mg/dL — AB (ref 65–99)
GLUCOSE-CAPILLARY: 193 mg/dL — AB (ref 65–99)
Glucose-Capillary: 183 mg/dL — ABNORMAL HIGH (ref 65–99)
Glucose-Capillary: 230 mg/dL — ABNORMAL HIGH (ref 65–99)
Glucose-Capillary: 257 mg/dL — ABNORMAL HIGH (ref 65–99)

## 2015-12-27 SURGERY — ANTERIOR LATERAL LUMBAR FUSION 2 LEVELS
Anesthesia: General | Site: Flank | Laterality: Right

## 2015-12-27 MED ORDER — PROPOFOL 10 MG/ML IV BOLUS
INTRAVENOUS | Status: DC | PRN
  Administered 2015-12-27: 150 mg via INTRAVENOUS

## 2015-12-27 MED ORDER — SODIUM CHLORIDE 0.9 % IV SOLN: INTRAVENOUS | Status: DC

## 2015-12-27 MED ORDER — LIDOCAINE HCL (CARDIAC) 20 MG/ML IV SOLN
INTRAVENOUS | Status: DC | PRN
  Administered 2015-12-27: 60 mg via INTRAVENOUS

## 2015-12-27 MED ORDER — PROPOFOL 10 MG/ML IV BOLUS
INTRAVENOUS | Status: AC
  Filled 2015-12-27: qty 20

## 2015-12-27 MED ORDER — DEXTROSE 5 % IV SOLN
10.0000 mg | INTRAVENOUS | Status: DC | PRN
  Administered 2015-12-27: 50 ug/min via INTRAVENOUS

## 2015-12-27 MED ORDER — VANCOMYCIN HCL 1000 MG IV SOLR
INTRAVENOUS | Status: AC
  Filled 2015-12-27: qty 1000

## 2015-12-27 MED ORDER — PHENOL 1.4 % MT LIQD: 1.0000 | OROMUCOSAL | Status: DC | PRN

## 2015-12-27 MED ORDER — ONDANSETRON HCL 4 MG/2ML IJ SOLN
INTRAMUSCULAR | Status: AC
  Filled 2015-12-27: qty 2

## 2015-12-27 MED ORDER — LOSARTAN POTASSIUM-HCTZ 100-12.5 MG PO TABS: 1.0000 | ORAL_TABLET | Freq: Every day | ORAL | Status: DC

## 2015-12-27 MED ORDER — HYDROMORPHONE HCL 1 MG/ML IJ SOLN: 0.5000 mg | INTRAMUSCULAR | Status: DC | PRN

## 2015-12-27 MED ORDER — GLIPIZIDE 10 MG PO TABS
10.0000 mg | ORAL_TABLET | Freq: Two times a day (BID) | ORAL | Status: DC
  Administered 2015-12-27 – 2015-12-28 (×2): 10 mg via ORAL
  Filled 2015-12-27 (×4): qty 1

## 2015-12-27 MED ORDER — ACETAMINOPHEN 325 MG PO TABS: 650.0000 mg | ORAL_TABLET | ORAL | Status: DC | PRN

## 2015-12-27 MED ORDER — FAMOTIDINE 20 MG PO TABS
40.0000 mg | ORAL_TABLET | Freq: Every day | ORAL | Status: DC
  Administered 2015-12-27: 40 mg via ORAL
  Filled 2015-12-27: qty 2

## 2015-12-27 MED ORDER — OXYCODONE-ACETAMINOPHEN 5-325 MG PO TABS
1.0000 | ORAL_TABLET | ORAL | Status: DC | PRN
  Administered 2015-12-27 – 2015-12-28 (×3): 2 via ORAL
  Filled 2015-12-27 (×4): qty 2

## 2015-12-27 MED ORDER — PHENYLEPHRINE 40 MCG/ML (10ML) SYRINGE FOR IV PUSH (FOR BLOOD PRESSURE SUPPORT)
PREFILLED_SYRINGE | INTRAVENOUS | Status: AC
  Filled 2015-12-27: qty 10

## 2015-12-27 MED ORDER — HYDROCHLOROTHIAZIDE 12.5 MG PO CAPS
12.5000 mg | ORAL_CAPSULE | Freq: Every day | ORAL | Status: DC
  Administered 2015-12-27: 12.5 mg via ORAL
  Filled 2015-12-27: qty 1

## 2015-12-27 MED ORDER — LOSARTAN POTASSIUM 50 MG PO TABS
100.0000 mg | ORAL_TABLET | Freq: Every day | ORAL | Status: DC
  Administered 2015-12-27: 100 mg via ORAL
  Filled 2015-12-27: qty 2

## 2015-12-27 MED ORDER — ROCURONIUM BROMIDE 50 MG/5ML IV SOLN
INTRAVENOUS | Status: AC
  Filled 2015-12-27: qty 1

## 2015-12-27 MED ORDER — BISACODYL 5 MG PO TBEC: 5.0000 mg | DELAYED_RELEASE_TABLET | Freq: Every day | ORAL | Status: DC | PRN

## 2015-12-27 MED ORDER — SUGAMMADEX SODIUM 200 MG/2ML IV SOLN
INTRAVENOUS | Status: DC | PRN
  Administered 2015-12-27: 200 mg via INTRAVENOUS

## 2015-12-27 MED ORDER — ONDANSETRON HCL 4 MG/2ML IJ SOLN: 4.0000 mg | INTRAMUSCULAR | Status: DC | PRN

## 2015-12-27 MED ORDER — ROCURONIUM BROMIDE 100 MG/10ML IV SOLN
INTRAVENOUS | Status: DC | PRN
  Administered 2015-12-27: 40 mg via INTRAVENOUS

## 2015-12-27 MED ORDER — ALBUMIN HUMAN 5 % IV SOLN
INTRAVENOUS | Status: DC | PRN
  Administered 2015-12-27: 08:00:00 via INTRAVENOUS

## 2015-12-27 MED ORDER — HYDROCODONE-ACETAMINOPHEN 10-325 MG PO TABS: 1.0000 | ORAL_TABLET | ORAL | Status: DC | PRN

## 2015-12-27 MED ORDER — FENTANYL CITRATE (PF) 250 MCG/5ML IJ SOLN
INTRAMUSCULAR | Status: AC
  Filled 2015-12-27: qty 5

## 2015-12-27 MED ORDER — LACTATED RINGERS IV SOLN: INTRAVENOUS | Status: DC

## 2015-12-27 MED ORDER — TURMERIC CURCUMIN 500 MG PO CAPS: 1500.0000 mg | ORAL_CAPSULE | Freq: Every day | ORAL | Status: DC

## 2015-12-27 MED ORDER — LACTATED RINGERS IV SOLN
INTRAVENOUS | Status: DC | PRN
  Administered 2015-12-27 (×2): via INTRAVENOUS

## 2015-12-27 MED ORDER — PANTOPRAZOLE SODIUM 40 MG IV SOLR
40.0000 mg | Freq: Every day | INTRAVENOUS | Status: DC
  Filled 2015-12-27 (×2): qty 40

## 2015-12-27 MED ORDER — INSULIN ASPART 100 UNIT/ML ~~LOC~~ SOLN: 0.0000 [IU] | Freq: Three times a day (TID) | SUBCUTANEOUS | Status: DC

## 2015-12-27 MED ORDER — HYDROMORPHONE HCL 1 MG/ML IJ SOLN
0.2500 mg | INTRAMUSCULAR | Status: DC | PRN
  Administered 2015-12-27 (×2): 0.5 mg via INTRAVENOUS

## 2015-12-27 MED ORDER — EPHEDRINE SULFATE 50 MG/ML IJ SOLN
INTRAMUSCULAR | Status: DC | PRN
  Administered 2015-12-27 (×3): 10 mg via INTRAVENOUS

## 2015-12-27 MED ORDER — HEMOSTATIC AGENTS (NO CHARGE) OPTIME
TOPICAL | Status: DC | PRN
  Administered 2015-12-27: 1 via TOPICAL

## 2015-12-27 MED ORDER — SUCCINYLCHOLINE CHLORIDE 20 MG/ML IJ SOLN
INTRAMUSCULAR | Status: AC
  Filled 2015-12-27: qty 1

## 2015-12-27 MED ORDER — ONDANSETRON HCL 4 MG/2ML IJ SOLN
INTRAMUSCULAR | Status: DC | PRN
  Administered 2015-12-27: 4 mg via INTRAVENOUS

## 2015-12-27 MED ORDER — INSULIN ASPART 100 UNIT/ML ~~LOC~~ SOLN: 5.0000 [IU] | Freq: Once | SUBCUTANEOUS | Status: DC

## 2015-12-27 MED ORDER — 0.9 % SODIUM CHLORIDE (POUR BTL) OPTIME
TOPICAL | Status: DC | PRN
  Administered 2015-12-27: 1000 mL

## 2015-12-27 MED ORDER — ACETAMINOPHEN 650 MG RE SUPP: 650.0000 mg | RECTAL | Status: DC | PRN

## 2015-12-27 MED ORDER — MENTHOL 3 MG MT LOZG: 1.0000 | LOZENGE | OROMUCOSAL | Status: DC | PRN

## 2015-12-27 MED ORDER — EPHEDRINE SULFATE 50 MG/ML IJ SOLN
INTRAMUSCULAR | Status: AC
  Filled 2015-12-27: qty 1

## 2015-12-27 MED ORDER — MIDAZOLAM HCL 5 MG/5ML IJ SOLN
INTRAMUSCULAR | Status: DC | PRN
  Administered 2015-12-27: 1 mg via INTRAVENOUS

## 2015-12-27 MED ORDER — SENNA 8.6 MG PO TABS
1.0000 | ORAL_TABLET | Freq: Two times a day (BID) | ORAL | Status: DC
  Administered 2015-12-27 – 2015-12-28 (×3): 8.6 mg via ORAL
  Filled 2015-12-27 (×3): qty 1

## 2015-12-27 MED ORDER — SUGAMMADEX SODIUM 200 MG/2ML IV SOLN
INTRAVENOUS | Status: AC
  Filled 2015-12-27: qty 2

## 2015-12-27 MED ORDER — LEVOTHYROXINE SODIUM 25 MCG PO TABS
25.0000 ug | ORAL_TABLET | Freq: Every day | ORAL | Status: DC
  Administered 2015-12-28: 25 ug via ORAL
  Filled 2015-12-27 (×2): qty 1

## 2015-12-27 MED ORDER — SODIUM CHLORIDE 0.9% FLUSH: 3.0000 mL | INTRAVENOUS | Status: DC | PRN

## 2015-12-27 MED ORDER — HYDROMORPHONE HCL 1 MG/ML IJ SOLN
INTRAMUSCULAR | Status: DC | PRN
  Administered 2015-12-27: 0.5 mg via INTRAVENOUS

## 2015-12-27 MED ORDER — THROMBIN 5000 UNITS EX SOLR
CUTANEOUS | Status: DC | PRN
  Administered 2015-12-27 (×2): 5000 [IU] via TOPICAL

## 2015-12-27 MED ORDER — SODIUM CHLORIDE 0.9 % IR SOLN
Status: DC | PRN
  Administered 2015-12-27: 09:00:00

## 2015-12-27 MED ORDER — HYDROMORPHONE HCL 1 MG/ML IJ SOLN
INTRAMUSCULAR | Status: AC
  Filled 2015-12-27: qty 1

## 2015-12-27 MED ORDER — CYCLOBENZAPRINE HCL 10 MG PO TABS
10.0000 mg | ORAL_TABLET | Freq: Three times a day (TID) | ORAL | Status: DC | PRN
  Administered 2015-12-27: 10 mg via ORAL
  Filled 2015-12-27: qty 1

## 2015-12-27 MED ORDER — SODIUM CHLORIDE 0.9 % IV SOLN: 250.0000 mL | INTRAVENOUS | Status: DC

## 2015-12-27 MED ORDER — DOCUSATE SODIUM 100 MG PO CAPS
100.0000 mg | ORAL_CAPSULE | Freq: Two times a day (BID) | ORAL | Status: DC
  Administered 2015-12-27 – 2015-12-28 (×3): 100 mg via ORAL
  Filled 2015-12-27 (×3): qty 1

## 2015-12-27 MED ORDER — INSULIN ASPART 100 UNIT/ML ~~LOC~~ SOLN
5.0000 [IU] | Freq: Once | SUBCUTANEOUS | Status: DC
Start: 2015-12-27 — End: 2015-12-28

## 2015-12-27 MED ORDER — METFORMIN HCL 500 MG PO TABS
1000.0000 mg | ORAL_TABLET | Freq: Two times a day (BID) | ORAL | Status: DC
  Administered 2015-12-27 – 2015-12-28 (×2): 1000 mg via ORAL
  Filled 2015-12-27 (×2): qty 2

## 2015-12-27 MED ORDER — MIDAZOLAM HCL 2 MG/2ML IJ SOLN
INTRAMUSCULAR | Status: AC
  Filled 2015-12-27: qty 2

## 2015-12-27 MED ORDER — SODIUM CHLORIDE 0.9 % IJ SOLN
INTRAMUSCULAR | Status: AC
  Filled 2015-12-27: qty 10

## 2015-12-27 MED ORDER — MILK THISTLE 175 MG PO TABS: 175.0000 mg | ORAL_TABLET | Freq: Every day | ORAL | Status: DC

## 2015-12-27 MED ORDER — FENTANYL CITRATE (PF) 100 MCG/2ML IJ SOLN
INTRAMUSCULAR | Status: DC | PRN
  Administered 2015-12-27 (×5): 50 ug via INTRAVENOUS

## 2015-12-27 MED ORDER — INSULIN ASPART 100 UNIT/ML ~~LOC~~ SOLN: 0.0000 [IU] | Freq: Every day | SUBCUTANEOUS | Status: DC

## 2015-12-27 MED ORDER — LIDOCAINE HCL (CARDIAC) 20 MG/ML IV SOLN
INTRAVENOUS | Status: AC
  Filled 2015-12-27: qty 5

## 2015-12-27 MED ORDER — SODIUM CHLORIDE 0.9% FLUSH
3.0000 mL | Freq: Two times a day (BID) | INTRAVENOUS | Status: DC
  Administered 2015-12-27 – 2015-12-28 (×2): 3 mL via INTRAVENOUS

## 2015-12-27 MED ORDER — CANAGLIFLOZIN 100 MG PO TABS
100.0000 mg | ORAL_TABLET | Freq: Every day | ORAL | Status: DC
  Administered 2015-12-28: 100 mg via ORAL
  Filled 2015-12-27 (×2): qty 1

## 2015-12-27 MED ORDER — BUPIVACAINE-EPINEPHRINE (PF) 0.5% -1:200000 IJ SOLN
INTRAMUSCULAR | Status: DC | PRN
Start: 2015-12-27 — End: 2015-12-27
  Administered 2015-12-27: 15 mL

## 2015-12-27 MED ORDER — FLEET ENEMA 7-19 GM/118ML RE ENEM: 1.0000 | ENEMA | Freq: Once | RECTAL | Status: DC | PRN

## 2015-12-27 MED ORDER — LIDOCAINE-EPINEPHRINE 1 %-1:100000 IJ SOLN
INTRAMUSCULAR | Status: DC | PRN
  Administered 2015-12-27: 15 mL

## 2015-12-27 MED ORDER — CEFAZOLIN SODIUM 1-5 GM-% IV SOLN
1.0000 g | Freq: Three times a day (TID) | INTRAVENOUS | Status: AC
  Administered 2015-12-27 (×2): 1 g via INTRAVENOUS
  Filled 2015-12-27 (×2): qty 50

## 2015-12-27 MED ORDER — ZOLPIDEM TARTRATE 5 MG PO TABS: 5.0000 mg | ORAL_TABLET | Freq: Every evening | ORAL | Status: DC | PRN

## 2015-12-27 SURGICAL SUPPLY — 60 items
BLADE CLIPPER SURG (BLADE) IMPLANT
BOLT SPNL LRG 45X5.5XPLAT NS (Screw) ×1 IMPLANT
CAGE COROENT XLWTI 8X22X50-10 (Cage) ×3 IMPLANT
CHLORAPREP W/TINT 26ML (MISCELLANEOUS) ×3 IMPLANT
COVER BACK TABLE 24X17X13 BIG (DRAPES) IMPLANT
DECANTER SPIKE VIAL GLASS SM (MISCELLANEOUS) ×3 IMPLANT
DERMABOND ADHESIVE PROPEN (GAUZE/BANDAGES/DRESSINGS) ×2
DERMABOND ADVANCED (GAUZE/BANDAGES/DRESSINGS) ×2
DERMABOND ADVANCED .7 DNX12 (GAUZE/BANDAGES/DRESSINGS) ×1 IMPLANT
DERMABOND ADVANCED .7 DNX6 (GAUZE/BANDAGES/DRESSINGS) ×1 IMPLANT
DRAPE C-ARM 42X72 X-RAY (DRAPES) ×3 IMPLANT
DRAPE C-ARMOR (DRAPES) ×3 IMPLANT
DRAPE LAPAROTOMY 100X72X124 (DRAPES) ×3 IMPLANT
DRAPE POUCH INSTRU U-SHP 10X18 (DRAPES) ×3 IMPLANT
DRSG OPSITE POSTOP 3X4 (GAUZE/BANDAGES/DRESSINGS) ×6 IMPLANT
ELECT REM PT RETURN 9FT ADLT (ELECTROSURGICAL) ×3
ELECTRODE REM PT RTRN 9FT ADLT (ELECTROSURGICAL) ×1 IMPLANT
GAUZE SPONGE 4X4 16PLY XRAY LF (GAUZE/BANDAGES/DRESSINGS) IMPLANT
GLOVE BIOGEL PI IND STRL 7.5 (GLOVE) ×6 IMPLANT
GLOVE BIOGEL PI INDICATOR 7.5 (GLOVE) ×12
GLOVE ECLIPSE 7.0 STRL STRAW (GLOVE) ×3 IMPLANT
GLOVE EXAM NITRILE LRG STRL (GLOVE) IMPLANT
GLOVE EXAM NITRILE MD LF STRL (GLOVE) IMPLANT
GLOVE EXAM NITRILE XL STR (GLOVE) IMPLANT
GLOVE EXAM NITRILE XS STR PU (GLOVE) IMPLANT
GLOVE SS BIOGEL STRL SZ 7 (GLOVE) ×3 IMPLANT
GLOVE SUPERSENSE BIOGEL SZ 7 (GLOVE) ×6
GOWN STRL REUS W/ TWL LRG LVL3 (GOWN DISPOSABLE) ×3 IMPLANT
GOWN STRL REUS W/ TWL XL LVL3 (GOWN DISPOSABLE) IMPLANT
GOWN STRL REUS W/TWL 2XL LVL3 (GOWN DISPOSABLE) IMPLANT
GOWN STRL REUS W/TWL LRG LVL3 (GOWN DISPOSABLE) ×6
GOWN STRL REUS W/TWL XL LVL3 (GOWN DISPOSABLE)
KIT BASIN OR (CUSTOM PROCEDURE TRAY) ×3 IMPLANT
KIT DILATOR XLIF 5 (KITS) ×1 IMPLANT
KIT INFUSE XX SMALL 0.7CC (Orthopedic Implant) ×3 IMPLANT
KIT ROOM TURNOVER OR (KITS) ×3 IMPLANT
KIT SURGICAL ACCESS MAXCESS 4 (KITS) ×3 IMPLANT
KIT XLIF (KITS) ×2
MODULE NVM5 NEXT GEN EMG (NEEDLE) ×3 IMPLANT
NEEDLE HYPO 21X1.5 SAFETY (NEEDLE) ×3 IMPLANT
NEEDLE HYPO 25X1 1.5 SAFETY (NEEDLE) IMPLANT
NS IRRIG 1000ML POUR BTL (IV SOLUTION) ×3 IMPLANT
PACK LAMINECTOMY NEURO (CUSTOM PROCEDURE TRAY) ×3 IMPLANT
PLATE 2H DECADE 8MM (Plate) ×3 IMPLANT
PUTTY BONE ATTRAX 5CC STRIP (Putty) ×3 IMPLANT
SCREW 45MM (Screw) ×2 IMPLANT
SCREW DECADE 5.5X50 (Screw) ×3 IMPLANT
SPONGE LAP 4X18 X RAY DECT (DISPOSABLE) IMPLANT
SPONGE SURGIFOAM ABS GEL SZ50 (HEMOSTASIS) ×3 IMPLANT
STAPLER SKIN PROX WIDE 3.9 (STAPLE) IMPLANT
SUT VIC AB 1 CT1 18XBRD ANBCTR (SUTURE) ×1 IMPLANT
SUT VIC AB 1 CT1 8-18 (SUTURE) ×2
SUT VIC AB 2-0 CT1 18 (SUTURE) ×3 IMPLANT
SUT VIC AB 3-0 SH 8-18 (SUTURE) ×3 IMPLANT
SYR 30ML LL (SYRINGE) ×3 IMPLANT
TAPE CLOTH 3X10 TAN LF (GAUZE/BANDAGES/DRESSINGS) ×9 IMPLANT
TOWEL OR 17X24 6PK STRL BLUE (TOWEL DISPOSABLE) IMPLANT
TOWEL OR 17X26 10 PK STRL BLUE (TOWEL DISPOSABLE) ×3 IMPLANT
TRAY FOLEY W/METER SILVER 14FR (SET/KITS/TRAYS/PACK) ×3 IMPLANT
WATER STERILE IRR 1000ML POUR (IV SOLUTION) ×3 IMPLANT

## 2015-12-27 NOTE — Progress Notes (Signed)
No acute events Pain well controlled Moving legs well Stable Hopefully home tomorrow

## 2015-12-27 NOTE — Transfer of Care (Signed)
Immediate Anesthesia Transfer of Care Note  Patient: Cathy Fuller  Procedure(s) Performed: Procedure(s) with comments: Right Sided Lumbar tw-three Lateral lumbar interbody fusion with lateral plate (Right) - Right Sided L2-3 Lateral lumbar interbody fusion with lateral plate  Patient Location: PACU  Anesthesia Type:General  Level of Consciousness: awake, alert , sedated, patient cooperative and responds to stimulation  Airway & Oxygen Therapy: Patient Spontanous Breathing and Patient connected to nasal cannula oxygen  Post-op Assessment: Report given to RN, Post -op Vital signs reviewed and stable and Patient moving all extremities  Post vital signs: Reviewed and stable  Last Vitals:  Filed Vitals:   12/27/15 0546  BP: 117/63  Pulse: 80  Temp: 36.9 C  Resp: 20    Complications: No apparent anesthesia complications

## 2015-12-27 NOTE — H&P (Signed)
CC:  No chief complaint on file.   HPI: Cathy Fuller is a 55 y.o. female s/p L3-5 fusion in 2001.  She has had progressive back pain and pain radiating into her legs.  She complains of left hip flexor weakness.  She denies numbness.  She has failed medical management.  She presents for L2-3 lateral interbody fusion with lateral plate.      PMH: Past Medical History  Diagnosis Date  . GERD (gastroesophageal reflux disease)   . PUD (peptic ulcer disease)   . Anemia   . Diabetes mellitus without complication (HCC)   . Thyroid disease   . Blood transfusion without reported diagnosis   . Barrett's esophagus   . Esophageal stricture   . Fatty liver 2009  . Internal hemorrhoids   . NASH (nonalcoholic steatohepatitis)     grade 1 fibrosis  . Hx of adenomatous colonic polyps   . Hyperplastic colon polyp   . IBS (irritable bowel syndrome)   . Hepatitis A 1983  . Hypertension   . Hyperlipidemia   . Sleep apnea   . 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 Complication of anesthesia     1978 hard time waking up after surgery and low blood pressure  . Stroke-like symptoms 1978    drug induced stroke  . Hypothyroidism     PSH: Past Surgical History  Procedure Laterality Date  . Breast biopsy    . Hernia repair    . Carpal tunnel release Bilateral   . Back surgery    . Cholecystectomy, laparoscopic  1998  . Abdominal hysterectomy    . Knee surgery Left   . Shoulder surgery Left   . Bladder surgery      x 2  . Cholecystectomy    . Colonoscopy    . Tonsillectomy      SH: Social History  Substance Use Topics  . Smoking status: Never Smoker   . Smokeless tobacco: Never Used  . Alcohol Use: 0.5 - 1.0 oz/week    1-2 Standard drinks or equivalent per week     Comment: rare    MEDS: Prior to Admission medications   Medication Sig Start Date End Date Taking? Authorizing Provider  cyclobenzaprine (FLEXERIL) 10 MG tablet Take 1 tablet (10  mg total) by mouth 3 (three) times daily as needed. 10/18/15  Yes [name] Jeffery, PA-C  dapagliflozin propanediol (FARXIGA) 5 MG TABS tablet Take 5 mg by mouth daily. 10/18/15  Yes [name] Jeffery, PA-C  famotidine (PEPCID) 20 MG tablet Take 40 mg by mouth at bedtime.    Yes Historical Provider, MD  glipiZIDE (GLUCOTROL) 10 MG tablet Take 1 tablet (10 mg total) by mouth 2 (two) times daily before a meal. 07/12/15  Yes [name] Jeffery, PA-C  HYDROcodone-acetaminophen (NORCO) 10-325 MG tablet Take 1 tablet by mouth every 8 (eight) hours as needed. Patient taking differently: Take 1 tablet by mouth every 8 (eight) hours as needed for moderate pain.  10/18/15  Yes [name] Jeffery, PA-C  ketoconazole (NIZORAL) 2 % cream Apply 1 application topically daily. 02/11/14  Yes [name] Jeffery, PA-C  KRILL OIL PO Take 500 mg by mouth daily.    Yes Historical Provider, MD  levothyroxine (SYNTHROID, LEVOTHROID) 25 MCG tablet Take 1 tablet (25 mcg total) by mouth daily. 07/12/15  Yes [name] Jeffery, PA-C  losartan-hydrochlorothiazide (HYZAAR) 100-12.5 MG per tablet TAKE 1 TABLET DAILY Patient taking differently: Take 1 tablet by  mouth daily.  07/12/15  Yes [name] Jeffery, PA-C  meloxicam (MOBIC) 15 MG tablet TAKE ONE-HALF (1/2) TABLET TWICE A DAY OR 1 TABLET ONCE A DAY AS NEEDED FOR PAIN Patient taking differently: Take 7.5-15 mg by mouth daily. TAKE ONE-HALF (1/2) TABLET TWICE A DAY OR 1 TABLET ONCE A DAY AS NEEDED FOR PAIN 10/18/15  Yes [name] Jeffery, PA-C  metFORMIN (GLUCOPHAGE) 1000 MG tablet Take 1 tablet (1,000 mg total) by mouth 2 (two) times daily with a meal. 06/13/15  Yes [name] Jeffery, PA-C  milk thistle 175 MG tablet Take 175 mg by mouth daily.   Yes Historical Provider, MD  Turmeric Curcumin 500 MG CAPS Take 1,500 mg by mouth daily.   Yes Historical Provider, MD    ALLERGY: Allergies  Allergen Reactions  . Aspirin   . Tape Other (See Comments)    Blisters    ROS: ROS  NEUROLOGIC EXAM: Awake, alert,  oriented Memory and concentration grossly intact Speech fluent, appropriate CN grossly intact Motor exam: Upper Extremities Deltoid Bicep Tricep Grip  Right 5/5 5/5 5/5 5/5  Left 5/5 5/5 5/5 5/5   Lower Extremity IP Quad PF DF EHL  Right 5/5 5/5 5/5 5/5 5/5  Left 4/5 5/5 5/5 5/5 5/5   Sensation grossly intact to LT  IMAGING: No new imaging  IMPRESSION: - 55 y.o. 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  She has neurological deficits as above.  PLAN: - L2-3 XLIF with lateral plate - Post-operative hospitalist consult for glucose management

## 2015-12-27 NOTE — Progress Notes (Signed)
Inpatient Diabetes Program Recommendations  AACE/ADA: New Consensus Statement on Inpatient Glycemic Control (2015)  Target Ranges:  Prepandial:   less than 140 mg/dL      Peak postprandial:   less than 180 mg/dL (1-2 hours)      Critically ill patients:  140 - 180 mg/dL   Results for Cathy, Fuller (MRN [id number]) as of 12/27/2015 12:12  Ref. Range 12/27/2015 05:53 12/27/2015 10:31  Glucose-Capillary Latest Ref Range: 65-99 mg/dL 409 (H) 811 (H)    Results for Cathy, Fuller (MRN [id number]) as of 12/27/2015 12:12  Ref. Range 06/13/2015 14:52 10/18/2015 10:27 12/20/2015 12:00  Hemoglobin A1C Latest Ref Range: 4.8-5.6 % 10.1 9.5 11.0 (H)    Admit with: Spondylolisthesis of Lumbar region  History: DM  Home DM Meds: Farxiga 5 mg daily       Glipizide 10 mg bid       Metformin 1000 mg bid  Current Insulin Orders: Novolog Resistant SSI (0-20 units) TID AC + HS      -Note patient saw her PCP Theora Gianotti, PA with [facility]) back in December.  Farxiga 5 mg daily was started at that visit.  Since December, A1c has risen to 11% from 9.5% back in December.  -Note per Dr. Satira Sark notes that he plans to discharge patient either on once daily Lantus or Novolog tid with meals.    Spoke to patient about her current A1c of 11.1%.  Explained what an A1c is and what it measures.  Reminded patient that her goal A1c is 7% or less per ADA standards to prevent both acute and long-term complications.  Explained to patient the extreme importance of good glucose control at home.    Patient told me she has had her A1c down in the 5% range after she lost weight.  Unfortunately has gained weight back.  Sees Theora Gianotti, PA with [facility].  [name] was added to her home DM med regimen back in December, however, patient told me she was unhappy with that medication and feels like it has worsened her CBG control.  Is interested in speaking with her PCP about the  possibility of starting a medication like Trulicity at home but is adamant that she will NOT start insulin at this time.    Discussed basic carbohydrate counting with patient and how to read a food label. Encouraged patient to avoid sweetened beverages and to limit desserts. Also encouraged patient to pay attention to serving sizes. Discussed with patient that women should have between 45-60 grams of carbohydrates per meal per day.  Also gave patient an educational pamphlet on carbohydrate counting.  Patient told me she is scheduled for follow up visit with her PCP on 01/17/16.  Is also scheduled to attend three DM education classes in the next few weeks at the [facility] Nutrition and DM management center.     --Will follow patient during hospitalization--  Ambrose Finland RN, MSN, CDE Diabetes Coordinator Inpatient Glycemic Control Team Team Pager: [phone] (8a-5p)

## 2015-12-27 NOTE — Progress Notes (Signed)
Orthopedic Tech Progress Note Patient Details:  Cathy Fuller December 13, 1960 [id number]  Patient ID: Cathy Fuller, female   DOB: 1961-02-01, 55 y.o.   MRN: [id number]   Cathy Fuller 12/27/2015, 4:45 PM Brace order completed by bio-tech vendor.

## 2015-12-27 NOTE — Anesthesia Procedure Notes (Signed)
Procedure Name: Intubation Date/Time: 12/27/2015 7:46 AM Performed by: Ferol Luz L Pre-anesthesia Checklist: Patient identified, Emergency Drugs available, Suction available, Patient being monitored and Timeout performed Patient Re-evaluated:Patient Re-evaluated prior to inductionOxygen Delivery Method: Circle system utilized Preoxygenation: Pre-oxygenation with 100% oxygen Intubation Type: IV induction Ventilation: Mask ventilation without difficulty Laryngoscope Size: Mac and 3 Grade View: Grade I Tube type: Oral Tube size: 7.5 mm Number of attempts: 1 Airway Equipment and Method: Stylet Placement Confirmation: ETT inserted through vocal cords under direct vision,  breath sounds checked- equal and bilateral and positive ETCO2 Secured at: 20 cm Tube secured with: Tape Dental Injury: Teeth and Oropharynx as per pre-operative assessment

## 2015-12-27 NOTE — Brief Op Note (Signed)
12/27/2015  10:08 AM  PATIENT:  Celene Squibb  55 y.o. female  PRE-OPERATIVE DIAGNOSIS:  Spondylolisthesis, Lumbosacral region  POST-OPERATIVE DIAGNOSIS:  Spondylolisthesis, Lumbosacral region  PROCEDURE:  Procedure(s) with comments: Right Sided Lumbar tw-three Lateral lumbar interbody fusion with lateral plate (Right) - Right Sided L2-3 Lateral lumbar interbody fusion with lateral plate  SURGEON:  Surgeon(s) and Role:    * Truitt Merle, MD - Primary    * Lisbeth Renshaw, MD - Assisting  PHYSICIAN ASSISTANT:   ASSISTANTS: Lisbeth Renshaw, MD  ANESTHESIA:   GETA  EBL:  Total I/O In: 1250 [I.V.:1000; IV Piggyback:250] Out: 305 [Urine:155; Blood:150]  BLOOD ADMINISTERED:none  DRAINS: none   LOCAL MEDICATIONS USED:  OTHER lidocaine and marcaine with epi  SPECIMEN:  No Specimen  DISPOSITION OF SPECIMEN:  N/A  COUNTS:  YES  TOURNIQUET:  * No tourniquets in log *  DICTATION: .Dragon Dictation  PLAN OF CARE: Admit to inpatient   PATIENT DISPOSITION:  PACU - hemodynamically stable.   Delay start of Pharmacological VTE agent (>24hrs) due to surgical blood loss or risk of bleeding: yes

## 2015-12-27 NOTE — Op Note (Signed)
12/27/2015  2:27 PM  PATIENT:  Cathy Fuller  55 y.o. 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  She complains of bilateral leg pain and left hip flexor weakness.  She has a retrolisthesis at L2-3 and neuroforaminal height loss due to disc collapse.  She has failed to improve with aggressive medical treatment.  I proposed the above procedure. Patient understood the risks, benefits, and alternatives and potential outcomes and wished to proceed.  PROCEDURE DETAILS: The patient was brought to the operating room. After smooth induction of general endotracheal anesthesia the patient was placed in the lateral position with her right side up and secured with tape. Localizing views were taken with fluoroscopy. The operative site was then prepped and draped in the usual sterile fashion. The planned incisions were infiltrated with lidocaine with epinephrine.  The skin was sharply incised and the oblique abdominal muscles were bluntly dissected. I was able to palpate the twelfth rib and encountered an easily dissected fat plane, consistent with the retroperitoneum. I inserted the dilator and approached the posterior edge of the middle third of the L2-3 disc space. I confirmed that I was not in contact with any nerves of the lumbar plexus. The tract was sequentially dilated and then the retractor was introduced. I incised the disc space and then passed a Cobb elevator along each endplate and penetrated the annulus on the contralateral side. I then used  paddle  spacers, ring curettes, and a rasp to complete the discectomy. A trial spacer was used to determine appropriate graft size and then a lordotic interbody graft packed with B tricalcium phospate and BMP was inserted. The retractor was removed after hemostasis was confirmed.  The lateral osteophyte was resected from the inferior endplate of L2 and superior endplate of L3. A two hole plate was inserted into the field.  A bone awl was used to create a trajector for the screw just inferior to the superior endplate of L3, above the L3 pedicle screws.  A screw was then passed along this trajectory.  The awl was then used to create a trajectory for the L2 screw.  A screw was inserted.  Both were locked with a torque device and the center screw on the plate was locked.  Hemostasis was again confirmed and the retractor was removed. The incisions were closed in layers with interrupted vicryl sutures. The skin was closed with running vicryl sutures and dermabond.  PATIENT DISPOSITION:  PACU then floor   Delay start of Pharmacological VTE agent (>24hrs) due to surgical blood loss or risk of bleeding:  Yes

## 2015-12-27 NOTE — Progress Notes (Signed)
Utilization review completed.  

## 2015-12-27 NOTE — Consult Note (Signed)
[facility] Medical Consultation  Yasamin Karel ZOX:[id number] DOB: October 13, 1961 DOA: 12/27/2015 PCP: Olene Floss   Requesting physician: Dr Bevely Palmer - Neurosurgery Date of consultation: 12/27/15 Reason for consultation: DM management  Impression/Recommendations Active Problems:   Spondylolisthesis of lumbar region  Diabetes: last A1c 11 as of 12/20/15. Coorelates w/ a home avg glucose of 250. Her poorly controlled glucose is a set up for poor wound healing and longterm outcomes. Pt still asleep at time of exam as she was pot-op. Will need tighter glucose control after discharge. She will be managed w/ SSI while in the hospital and would recommend she begin injectable therapy at time of discharge. Pt currently on max dose Glipizide and Metformin and recently started on Farxiga.  - SSI resistant - Novolog 5 units x1 now - Diabetes counseling - case management for diabetes supplies.  - At time of DC, Pt can either start on long acting Lantus (low dose w/ titration instructions) or on meal time coverage w/ short acting Novolog  Fusion of L2-3 due to Spondylolisthesis. Primary admitting Dx. Pt is POD #0. Surgeon is Dr. Bevely Palmer.  - mgt per primary team  DVT prophylaxis:  - per primary team  HTN: blood pressure appears well controlled - recommend hydralazine if needed for additional PRN blood pressure control.     I will followup again tomorrow. Please contact me if I can be of assistance in the meanwhile. Thank you for this consultation.  Chief Complaint: lumbar back pain  HPI:  Lumbar back pain. Radiates in the legs bilaterally. No numbness. Some weakness. Getting worse. Ongoing for a prolonged period of time so patient presenting for surgical correction.  Review of Systems:  Unable to obtain further due to pt coming out from anesthesia  Past Medical History  Diagnosis Date  . GERD (gastroesophageal reflux disease)   . PUD (peptic ulcer disease)   . Anemia   . Diabetes  mellitus without complication (HCC)   . Thyroid disease   . Blood transfusion without reported diagnosis   . Barrett's esophagus   . Esophageal stricture   . Fatty liver 2009  . Internal hemorrhoids   . NASH (nonalcoholic steatohepatitis)     grade 1 fibrosis  . Hx of adenomatous colonic polyps   . Hyperplastic colon polyp   . IBS (irritable bowel syndrome)   . Hepatitis A 1983  . Hypertension   . Hyperlipidemia   . Sleep apnea   . 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 Complication of anesthesia     1978 hard time waking up after surgery and low blood pressure  . Stroke-like symptoms 1978    drug induced stroke  . Hypothyroidism    Past Surgical History  Procedure Laterality Date  . Breast biopsy    . Hernia repair    . Carpal tunnel release Bilateral   . Back surgery    . Cholecystectomy, laparoscopic  1998  . Abdominal hysterectomy    . Knee surgery Left   . Shoulder surgery Left   . Bladder surgery      x 2  . Cholecystectomy    . Colonoscopy    . Tonsillectomy     Social History:  reports that she has never smoked. She has never used smokeless tobacco. She reports that she drinks about 0.5 - 1.0 oz of alcohol per week. She reports that she does not use illicit drugs.  Allergies  Allergen Reactions  .  Aspirin   . Tape Other (See Comments)    Blisters   Family History  Problem Relation Age of Onset  . Thyroid disease Mother   . Stroke Mother   . Heart disease Mother   . Hyperlipidemia Mother   . Hypertension Mother   . Stroke Father   . Heart disease Father   . Hyperlipidemia Father   . Hypertension Father   . Diabetes Brother   . Colitis Daughter   . Colon cancer Maternal Uncle   . Stroke Maternal Grandmother   . Heart disease Maternal Grandfather     HEART ATTACK  . Heart disease Paternal Grandfather     HEART ATTACK    Prior to Admission medications   Medication Sig Start Date End Date Taking? Authorizing  Provider  cyclobenzaprine (FLEXERIL) 10 MG tablet Take 1 tablet (10 mg total) by mouth 3 (three) times daily as needed. 10/18/15  Yes [name] Jeffery, PA-C  dapagliflozin propanediol (FARXIGA) 5 MG TABS tablet Take 5 mg by mouth daily. 10/18/15  Yes [name] Jeffery, PA-C  famotidine (PEPCID) 20 MG tablet Take 40 mg by mouth at bedtime.    Yes Historical Provider, MD  glipiZIDE (GLUCOTROL) 10 MG tablet Take 1 tablet (10 mg total) by mouth 2 (two) times daily before a meal. 07/12/15  Yes [name] Jeffery, PA-C  HYDROcodone-acetaminophen (NORCO) 10-325 MG tablet Take 1 tablet by mouth every 8 (eight) hours as needed. Patient taking differently: Take 1 tablet by mouth every 8 (eight) hours as needed for moderate pain.  10/18/15  Yes [name] Jeffery, PA-C  ketoconazole (NIZORAL) 2 % cream Apply 1 application topically daily. 02/11/14  Yes [name] Jeffery, PA-C  KRILL OIL PO Take 500 mg by mouth daily.    Yes Historical Provider, MD  levothyroxine (SYNTHROID, LEVOTHROID) 25 MCG tablet Take 1 tablet (25 mcg total) by mouth daily. 07/12/15  Yes [name] Jeffery, PA-C  losartan-hydrochlorothiazide (HYZAAR) 100-12.5 MG per tablet TAKE 1 TABLET DAILY Patient taking differently: Take 1 tablet by mouth daily.  07/12/15  Yes [name] Jeffery, PA-C  meloxicam (MOBIC) 15 MG tablet TAKE ONE-HALF (1/2) TABLET TWICE A DAY OR 1 TABLET ONCE A DAY AS NEEDED FOR PAIN Patient taking differently: Take 7.5-15 mg by mouth daily. TAKE ONE-HALF (1/2) TABLET TWICE A DAY OR 1 TABLET ONCE A DAY AS NEEDED FOR PAIN 10/18/15  Yes [name] Jeffery, PA-C  metFORMIN (GLUCOPHAGE) 1000 MG tablet Take 1 tablet (1,000 mg total) by mouth 2 (two) times daily with a meal. 06/13/15  Yes [name] Jeffery, PA-C  milk thistle 175 MG tablet Take 175 mg by mouth daily.   Yes Historical Provider, MD  Turmeric Curcumin 500 MG CAPS Take 1,500 mg by mouth daily.   Yes Historical Provider, MD   Physical Exam: Blood pressure 117/63, pulse 80, temperature 98.5 F (36.9 C),  temperature source Oral, resp. rate 20, weight 101.152 kg (223 lb), SpO2 98 %. Filed Vitals:   12/27/15 0546  BP: 117/63  Pulse: 80  Temp: 98.5 F (36.9 C)  Resp: 20     General:  Sleeping and in NAD  Eyes:  Lids normal  EMoist mucous membranes  Neck:FROM, no thyromegaly  Cardio2/6 systolic murmur, regular rate and rhythm, trace lower extremity edema  Respiratory:  clear to auscultation bilaterally, normal effort  AbdomenNormal active bowel sounds, nondistended, nontender to palpation:  Musculoskeletal:  unable to ascertain due to patient being asleep from anesthesia   Neurlogic and Psychiatric: also unable to ascertain due to patient's current  postanesthesia state    Labs on Admission:  Basic Metabolic Panel:  Recent Labs Lab 12/20/15 1122  NA 140  K 4.5  CL 103  CO2 26  GLUCOSE 164*  BUN 22*  CREATININE 1.32*  CALCIUM 9.7   Liver Function Tests:  Recent Labs Lab 12/20/15 1122  AST 67*  ALT 67*  ALKPHOS 72  BILITOT 0.8  PROT 7.5  ALBUMIN 4.0   No results for input(s): LIPASE, AMYLASE in the last 168 hours. No results for input(s): AMMONIA in the last 168 hours. CBC:  Recent Labs Lab 12/20/15 1122  WBC 7.9  HGB 11.9*  HCT 36.2  MCV 94.3  PLT 185   Cardiac Enzymes: No results for input(s): CKTOTAL, CKMB, CKMBINDEX, TROPONINI in the last 168 hours. BNP: Invalid input(s): POCBNP CBG: No results for input(s): GLUCAP in the last 168 hours.  Radiological Exams on Admission: Dg Lumbar Spine 2-3 Views  12/27/2015  CLINICAL DATA:  Right-sided lumbar fusion at L2-3 EXAM: LUMBAR SPINE - 2-3 VIEW COMPARISON:  CT lumbar spine of 12/05/2010 and MR lumbar spine of 10/28/2014 FINDINGS: Two C-arm spot films were returned. The field of view is very limited and therefore exact level is difficult to ascertain on the current images. By history this represents fusion from a right lateral approach with screw and interbody prosthetic disc placement at L2-3. On  the views obtained no complicating features are seen. Hardware from prior fusion at L3-4 and L4-5 is noted. IMPRESSION: Right lateral fusion at L2-3 as noted above. Electronically Signed   By: Dwyane Dee M.D.   On: 12/27/2015 10:21        Kathleena Freeman J [facility]  If 7PM-7AM, please contact night-coverage [email or web address] Password [facility] 12/27/2015, 10:27 AM

## 2015-12-27 NOTE — Anesthesia Postprocedure Evaluation (Signed)
Anesthesia Post Note  Patient: Cathy Fuller  Procedure(s) Performed: Procedure(s) (LRB): Right Sided Lumbar tw-three Lateral lumbar interbody fusion with lateral plate (Right)  Patient location during evaluation: PACU Anesthesia Type: General Level of consciousness: awake and alert Pain management: pain level controlled Vital Signs Assessment: post-procedure vital signs reviewed and stable Respiratory status: spontaneous breathing, nonlabored ventilation, respiratory function stable and patient connected to nasal cannula oxygen Cardiovascular status: blood pressure returned to baseline and stable Postop Assessment: no signs of nausea or vomiting Anesthetic complications: no    Last Vitals:  Filed Vitals:   12/27/15 1215 12/27/15 1230  BP: 102/54 103/58  Pulse: 83 82  Temp:    Resp: 19 15    Last Pain:  Filed Vitals:   12/27/15 1236  PainSc: Asleep                 Raaga Maeder L

## 2015-12-27 NOTE — Progress Notes (Signed)
Orthopedic Tech Progress Note Patient Details:  Sharonlee Nine 02/12/1961 [id number]  Patient ID: Celene Squibb, female   DOB: 09-Nov-1961, 55 y.o.   MRN: [id number] Called in bio-tech brace order; spoke with Anderson Malta, Shayaan Parke 12/27/2015, 1:35 PM

## 2015-12-28 ENCOUNTER — Encounter (HOSPITAL_COMMUNITY): Payer: Self-pay | Admitting: Neurological Surgery

## 2015-12-28 LAB — BASIC METABOLIC PANEL
ANION GAP: 10 (ref 5–15)
Anion gap: 12 (ref 5–15)
BUN: 22 mg/dL — ABNORMAL HIGH (ref 6–20)
BUN: 23 mg/dL — AB (ref 6–20)
CALCIUM: 8.4 mg/dL — AB (ref 8.9–10.3)
CALCIUM: 9.2 mg/dL (ref 8.9–10.3)
CO2: 23 mmol/L (ref 22–32)
CO2: 20 mmol/L — AB (ref 22–32)
CREATININE: 1.77 mg/dL — AB (ref 0.44–1.00)
Chloride: 106 mmol/L (ref 101–111)
Chloride: 106 mmol/L (ref 101–111)
Creatinine, Ser: 1.75 mg/dL — ABNORMAL HIGH (ref 0.44–1.00)
GFR calc Af Amer: 36 mL/min — ABNORMAL LOW (ref >60)
GFR calc non Af Amer: 31 mL/min — ABNORMAL LOW (ref >60)
GFR, EST AFRICAN AMERICAN: 36 mL/min — AB (ref >60)
GFR, EST NON AFRICAN AMERICAN: 31 mL/min — AB (ref >60)
GLUCOSE: 109 mg/dL — AB (ref 65–99)
GLUCOSE: 104 mg/dL — AB (ref 65–99)
Potassium: 3.8 mmol/L (ref 3.5–5.1)
Potassium: 5.6 mmol/L — ABNORMAL HIGH (ref 3.5–5.1)
Sodium: 139 mmol/L (ref 135–145)
Sodium: 138 mmol/L (ref 135–145)

## 2015-12-28 LAB — GLUCOSE, CAPILLARY
GLUCOSE-CAPILLARY: 94 mg/dL (ref 65–99)
Glucose-Capillary: 149 mg/dL — ABNORMAL HIGH (ref 65–99)

## 2015-12-28 LAB — CBC
HCT: 28.7 % — ABNORMAL LOW (ref 36.0–46.0)
Hemoglobin: 9.1 g/dL — ABNORMAL LOW (ref 12.0–15.0)
MCH: 30.6 pg (ref 26.0–34.0)
MCHC: 31.7 g/dL (ref 30.0–36.0)
MCV: 96.6 fL (ref 78.0–100.0)
PLATELETS: 170 10*3/uL (ref 150–400)
RBC: 2.97 MIL/uL — ABNORMAL LOW (ref 3.87–5.11)
RDW: 14.1 % (ref 11.5–15.5)
WBC: 8.4 10*3/uL (ref 4.0–10.5)

## 2015-12-28 MED ORDER — BISACODYL 5 MG PO TBEC: 5.0000 mg | DELAYED_RELEASE_TABLET | Freq: Every day | ORAL | Status: DC | PRN

## 2015-12-28 MED ORDER — SODIUM CHLORIDE 0.9 % IV BOLUS (SEPSIS)
500.0000 mL | Freq: Once | INTRAVENOUS | Status: AC
  Administered 2015-12-28: 500 mL via INTRAVENOUS

## 2015-12-28 MED ORDER — SENNA 8.6 MG PO TABS: 1.0000 | ORAL_TABLET | Freq: Two times a day (BID) | ORAL | Status: DC

## 2015-12-28 MED ORDER — HYDROCODONE-ACETAMINOPHEN 10-325 MG PO TABS: 1.0000 | ORAL_TABLET | ORAL | Status: DC | PRN

## 2015-12-28 MED ORDER — DOCUSATE SODIUM 100 MG PO CAPS: 100.0000 mg | ORAL_CAPSULE | Freq: Two times a day (BID) | ORAL | Status: DC

## 2015-12-28 NOTE — Progress Notes (Signed)
OT Cancellation Note  Patient Details Name: Cathy Fuller MRN: [id number] DOB: 07-01-1961   Cancelled Treatment:    Reason Eval/Treat Not Completed: Other (comment) Pt not feeling well this am. Low BP. Will return after lunch.  [facility] Sayf Kerner, OTR/L  [phone] 12/28/2015 12/28/2015, 9:02 AM

## 2015-12-28 NOTE — Progress Notes (Signed)
Patient alert and oriented, mae's well, voiding adequate amount of urine, swallowing without difficulty, c/o mild pain . Patient discharged home with family. Script and discharged instructions given to patient. Patient and family stated understanding of instructions given. Pt. Is informed to follow up with PCP as soon as possible for diabetic management.

## 2015-12-28 NOTE — Evaluation (Signed)
Occupational Therapy Evaluation Patient Details Name: Cathy Fuller MRN: [id number] DOB: 04/18/1961 Today's Date: 12/28/2015    History of Present Illness Pt s/p Lateral interbody fusion L2-3, fixation with lateral plate W0-9 (06/22/90) PMH: hepatitis A, Diabetes, HTN   Clinical Impression   Patient presenting with decreased ADL and functional mobility independence secondary to above. Patient independent PTA. Patient currently functioning at an overall supervision to min assist level. Patient will benefit from acute OT to increase overall independence in the areas of ADLs, functional mobility, and overall safety in order to safely discharge home with assistance prn from husband.     Follow Up Recommendations  No OT follow up;Supervision - Intermittent    Equipment Recommendations  3 in 1 bedside comode (pt refusing at this time)    Recommendations for Other Services  None at this time    Precautions / Restrictions Precautions Precautions: Back Precaution Booklet Issued: Yes (comment) Precaution Comments: Pt requires cues for back precautions Required Braces or Orthoses: Spinal Brace Spinal Brace: Thoracolumbosacral orthotic Restrictions Weight Bearing Restrictions: No    Mobility Bed Mobility Overal bed mobility: Needs Assistance Bed Mobility: Rolling;Sidelying to Sit;Sit to Sidelying Rolling: Supervision Sidelying to sit: Min guard     Sit to sidelying: Supervision General bed mobility comments: Cues for proper technique with most difficulty with sidelying > sit with use of rail to A with pushing up. BP in recliner before transferring to bed 89/56  Transfers Overall transfer level: Needs assistance Equipment used: 1 person hand held assist Transfers: Sit to/from Stand Sit to Stand: Min guard;Supervision General transfer comment: Sueprvison to min guard for safety, no use of AD    Balance Overall balance assessment: Needs assistance Sitting-balance support: No upper  extremity supported;Feet supported Sitting balance-Leahy Scale: Good     Standing balance support: No upper extremity supported;During functional activity Standing balance-Leahy Scale: Fair    ADL Overall ADL's : Needs assistance/impaired Eating/Feeding: Set up;Sitting   Grooming: Set up;Sitting   Upper Body Bathing: Minimal assitance;Sitting   Lower Body Bathing: Moderate assistance;Sit to/from stand   Upper Body Dressing : Minimal assistance;Sitting   Lower Body Dressing: Moderate assistance;Sit to/from stand   Toilet Transfer: Supervision/safety;Min guard;BSC;Ambulation   Toileting- Architect and Hygiene: Supervision/safety;Sit to/from stand Toileting - Clothing Manipulation Details (indicate cue type and reason): cues to maintain back precautions   Tub/Shower Transfer Details (indicate cue type and reason): did not occur Functional mobility during ADLs: Supervision/safety;Min guard;Cueing for safety (cueing for back precautions) General ADL Comments: Educated pt on use of AE (reacher, sock aid, LH sponge, LH shoe horn) for LB ADLs. Encourged patient to use 3-n-1 for showers in tub/shower unit, however, pt refused at this time. Since pt refusing 3-n-1, recommend patient's husband be present when patient showers for her safety. Educated pt on where she can purchase AE, including toileting aide.     Pertinent Vitals/Pain Pain Assessment: Faces Pain Score: 3  Faces Pain Scale: Hurts little more Pain Location: head Pain Descriptors / Indicators: Pressure Pain Intervention(s): Monitored during session     Hand Dominance Right   Extremity/Trunk Assessment Upper Extremity Assessment Upper Extremity Assessment: Overall WFL for tasks assessed   Lower Extremity Assessment Lower Extremity Assessment: Defer to PT evaluation   Cervical / Trunk Assessment Cervical / Trunk Assessment: Other exceptions Cervical / Trunk Exceptions: TLSO brace   Communication  Communication Communication: No difficulties   Cognition Arousal/Alertness: Awake/alert Behavior During Therapy: WFL for tasks assessed/performed Overall Cognitive Status: Within Functional Limits for tasks  assessed              Home Living Family/patient expects to be discharged to:: Private residence Living Arrangements: Spouse/significant other;Children Available Help at Discharge: Family;Available 24 hours/day Type of Home: House Home Access: Stairs to enter [employer] of Steps: 2 Entrance Stairs-Rails: Left;Right;Can reach both Home Layout: One level     Bathroom Shower/Tub: Tub/shower unit;Curtain   Firefighter: Standard     Home Equipment: Environmental consultant - 4 wheels;Cane - single point;Grab bars - toilet   Additional Comments: grab bars surrounding toilet bolt into toilet seat       Prior Functioning/Environment Level of Independence: Independent  Comments: Occasional use of cane for long distances when back was bothering her.    OT Diagnosis: Generalized weakness;Acute pain   OT Problem List: Decreased strength;Decreased activity tolerance;Impaired balance (sitting and/or standing);Decreased safety awareness;Decreased knowledge of use of DME or AE;Decreased knowledge of precautions;Pain   OT Treatment/Interventions: Self-care/ADL training;Energy conservation;DME and/or AE instruction;Therapeutic activities;Patient/family education;Balance training    OT Goals(Current goals can be found in the care plan section) Acute Rehab OT Goals Patient Stated Goal: go home OT Goal Formulation: With patient Time For Goal Achievement: 01/04/16 Potential to Achieve Goals: Good ADL Goals Pt Will Perform Grooming: with modified independence;standing Pt Will Perform Lower Body Bathing: with modified independence;sit to/from stand;with adaptive equipment Pt Will Perform Lower Body Dressing: with modified independence;sit to/from stand;with adaptive equipment Pt Will  Transfer to Toilet: with modified independence;ambulating;regular height toilet (using toilet riser) Pt Will Perform Tub/Shower Transfer: Tub transfer;ambulating;with modified independence  OT Frequency: Min 2X/week   Barriers to D/C: none known at this time   End of Session Equipment Utilized During Treatment: Back brace  Activity Tolerance: Patient tolerated treatment well Patient left: in chair;with call bell/phone within reach   Time: 1156-1219 OT Time Calculation (min): 23 min Charges:  OT General Charges $OT Visit: 1 Procedure OT Evaluation $OT Eval Low Complexity: 1 Procedure OT Treatments $Self Care/Home Management : 8-22 mins  Edwin Cap , MS, OTR/L, CLT Pager: [phone]  12/28/2015, 1:46 PM

## 2015-12-28 NOTE — Evaluation (Addendum)
Physical Therapy Evaluation Patient Details Name: Cathy Fuller MRN: [id number] DOB: 11-30-1960 Today's Date: 12/28/2015   History of Present Illness  Pt s/p Lateral interbody fusion L2-3, fixation with lateral plate W0-9 (06/22/90) PMH: hepatitis A, Diabetes, HTN  Clinical Impression  Pt admitted with above diagnosis. Pt currently with functional limitations due to the deficits listed below (see PT Problem List).  Pt will benefit from skilled PT to increase their independence and safety with mobility to allow discharge to the venue listed below.  Pt's gait limited today by soft BP and pt having some lightheadedness.  Deferred stairs, but reviewed proper technique.  Initiated education on back precautions and issued illustrated handout.  Will follow acutely, but no PT needs post acute care.     Follow Up Recommendations No PT follow up    Equipment Recommendations  None recommended by PT    Recommendations for Other Services       Precautions / Restrictions Precautions Precautions: Back Precaution Booklet Issued: Yes (comment) Required Braces or Orthoses: Spinal Brace Spinal Brace: Thoracolumbosacral orthotic Restrictions Weight Bearing Restrictions: No      Mobility  Bed Mobility Overal bed mobility: Needs Assistance Bed Mobility: Rolling;Sidelying to Sit;Sit to Sidelying Rolling: Supervision Sidelying to sit: Min guard     Sit to sidelying: Supervision General bed mobility comments: Cues for proper technique with most difficulty with sidelying > sit with use of rail to A with pushing up. BP in recliner before transferring to bed 89/56  Transfers Overall transfer level: Needs assistance Equipment used: 1 person hand held assist Transfers: Sit to/from Stand Sit to Stand: Min assist;Min guard         General transfer comment: HHA for sit to stand from bed, with MIN/guard from recliner. BP in standing 114/59  Ambulation/Gait Ambulation/Gait assistance: Min  guard Ambulation Distance (Feet): 175 Feet Assistive device: None (hand rail 50% of the time) Gait Pattern/deviations: Antalgic;Decreased stance time - right;Decreased step length - left     General Gait Details: Pt feeling foggy with gait.  states better than earlier (things aren't moving), but lightheaded. Used rail about 50% of the time.  Limited gait due to soft BP and lightheadedness  Stairs Stairs:  (deferred, but reviewed technique)          Wheelchair Mobility    Modified Rankin (Stroke Patients Only)       Balance Overall balance assessment: Needs assistance Sitting-balance support: Feet supported Sitting balance-Leahy Scale: Good       Standing balance-Leahy Scale: Fair                               Pertinent Vitals/Pain Pain Assessment: 0-10 Pain Score: 3  Pain Location: low back Pain Descriptors / Indicators: Aching Pain Intervention(s): Monitored during session    Home Living Family/patient expects to be discharged to:: Private residence Living Arrangements: Spouse/significant other;Children Available Help at Discharge: Family;Available 24 hours/day Type of Home: House Home Access: Stairs to enter Entrance Stairs-Rails: Left;Right;Can reach both Entrance Stairs-Number of Steps: 2 Home Layout: One level Home Equipment: Walker - 4 wheels;Cane - single point;Grab bars - toilet      Prior Function Level of Independence: Independent         Comments: Occasional use of cane for long distances when back was bothering her.     Hand Dominance        Extremity/Trunk Assessment   Upper Extremity Assessment: Defer to OT evaluation  Lower Extremity Assessment: Overall WFL for tasks assessed      Cervical / Trunk Assessment: Other exceptions  Communication   Communication: No difficulties  Cognition Arousal/Alertness: Awake/alert Behavior During Therapy: WFL for tasks assessed/performed Overall Cognitive Status:  Within Functional Limits for tasks assessed                      General Comments General comments (skin integrity, edema, etc.): Reviewed back precautions with patient with handout.    Exercises        Assessment/Plan    PT Assessment Patient needs continued PT services  PT Diagnosis Difficulty walking   PT Problem List Decreased activity tolerance;Decreased balance;Decreased mobility;Decreased knowledge of use of DME;Decreased knowledge of precautions  PT Treatment Interventions DME instruction;Gait training;Stair training;Functional mobility training;Therapeutic activities;Therapeutic exercise;Patient/family education   PT Goals (Current goals can be found in the Care Plan section) Acute Rehab PT Goals Patient Stated Goal: go home PT Goal Formulation: With patient Time For Goal Achievement: 01/04/16 Potential to Achieve Goals: Good    Frequency Min 5X/week   Barriers to discharge        Co-evaluation               End of Session Equipment Utilized During Treatment: Gait belt Activity Tolerance: Patient tolerated treatment well Patient left: in chair;with call bell/phone within reach Nurse Communication: Mobility status         Time: 1610-9604 PT Time Calculation (min) (ACUTE ONLY): 23 min   Charges:   PT Evaluation $PT Eval Moderate Complexity: 1 Procedure PT Treatments $Gait Training: 8-22 mins   PT G Codes:        Cathy Fuller 12/28/2015, 11:53 AM

## 2015-12-28 NOTE — Progress Notes (Addendum)
No acute events A little bit of right hip and thigh pain Reports left hip flexor strength is much improved AVSS Neurologically intact, a little bit of pain limitation to hip flexion on the right Doing well Creatinine bumped slightly Will hold blood pressure meds and re-check bmp this afternoon.  If stable or coming down can be d/c'd then. Also, Dr. Konrad Dolores recommended starting patient on insulin. She refuses to do this. She will follow up with her primary care doctor about getting her diabetes under better control.

## 2015-12-28 NOTE — Discharge Summary (Signed)
Date of Admission: 12/27/2015  Date of Discharge: 12/28/2015  Admission Diagnosis: Spondylolisthesis L2-3, lumbar stenosis L2-3, lumbar radiculopathy, prior fusion L3-L5  Discharge Diagnosis: Same   Procedure Performed: Lateral interbody fusion L2-3, fixation with lateral plate Z6-1  Attending: Ditty  Hospital Course:  The patient was admitted for the above listed operation. She tolerated it well. She was neurologically improved on postoperative day 1. Her creatinine bumped slightly, likely due to transient hypotension. Her blood pressure medicines were held and she was hydrated. After her creatinine normalized she was discharged home.  Discharged Medications: Resume prior meds  Follow up: With me in 2 weeks, follow-up with PCP as soon as possible for better diabetes control

## 2016-01-03 ENCOUNTER — Ambulatory Visit: Payer: Self-pay

## 2016-01-10 ENCOUNTER — Ambulatory Visit: Payer: Self-pay

## 2016-01-17 ENCOUNTER — Encounter: Payer: Self-pay | Admitting: Physician Assistant

## 2016-01-17 ENCOUNTER — Ambulatory Visit: Payer: Self-pay

## 2016-01-17 ENCOUNTER — Ambulatory Visit (INDEPENDENT_AMBULATORY_CARE_PROVIDER_SITE_OTHER): Payer: BLUE CROSS/BLUE SHIELD | Admitting: Physician Assistant

## 2016-01-17 VITALS — BP 112/75 | HR 71 | Temp 98.1°F | Resp 16 | Ht 64.25 in | Wt 226.0 lb

## 2016-01-17 DIAGNOSIS — E039 Hypothyroidism, unspecified: Secondary | ICD-10-CM | POA: Diagnosis not present

## 2016-01-17 DIAGNOSIS — M545 Low back pain: Secondary | ICD-10-CM | POA: Diagnosis not present

## 2016-01-17 DIAGNOSIS — E785 Hyperlipidemia, unspecified: Secondary | ICD-10-CM | POA: Diagnosis not present

## 2016-01-17 DIAGNOSIS — E1169 Type 2 diabetes mellitus with other specified complication: Secondary | ICD-10-CM

## 2016-01-17 DIAGNOSIS — R600 Localized edema: Secondary | ICD-10-CM | POA: Diagnosis not present

## 2016-01-17 DIAGNOSIS — I10 Essential (primary) hypertension: Secondary | ICD-10-CM

## 2016-01-17 DIAGNOSIS — E119 Type 2 diabetes mellitus without complications: Secondary | ICD-10-CM | POA: Diagnosis not present

## 2016-01-17 HISTORY — DX: Type 2 diabetes mellitus with other specified complication: E11.69

## 2016-01-17 LAB — CBC WITH DIFFERENTIAL/PLATELET
BASOS ABS: 0 10*3/uL (ref 0.0–0.1)
BASOS PCT: 0 % (ref 0–1)
Eosinophils Absolute: 0.6 10*3/uL (ref 0.0–0.7)
Eosinophils Relative: 7 % — ABNORMAL HIGH (ref 0–5)
HEMATOCRIT: 32 % — AB (ref 36.0–46.0)
HEMOGLOBIN: 10.1 g/dL — AB (ref 12.0–15.0)
LYMPHS PCT: 34 % (ref 12–46)
Lymphs Abs: 2.8 10*3/uL (ref 0.7–4.0)
MCH: 29.1 pg (ref 26.0–34.0)
MCHC: 31.6 g/dL (ref 30.0–36.0)
MCV: 92.2 fL (ref 78.0–100.0)
MPV: 11 fL (ref 8.6–12.4)
Monocytes Absolute: 0.4 10*3/uL (ref 0.1–1.0)
Monocytes Relative: 5 % (ref 3–12)
NEUTROS ABS: 4.5 10*3/uL (ref 1.7–7.7)
Neutrophils Relative %: 54 % (ref 43–77)
Platelets: 220 10*3/uL (ref 150–400)
RBC: 3.47 MIL/uL — AB (ref 3.87–5.11)
RDW: 14.3 % (ref 11.5–15.5)
WBC: 8.3 10*3/uL (ref 4.0–10.5)

## 2016-01-17 LAB — POCT GLYCOSYLATED HEMOGLOBIN (HGB A1C): HEMOGLOBIN A1C: 8

## 2016-01-17 LAB — TSH: TSH: 1.07 mIU/L

## 2016-01-17 LAB — GLUCOSE, POCT (MANUAL RESULT ENTRY): POC Glucose: 76 mg/dl (ref 70–99)

## 2016-01-17 MED ORDER — LEVOTHYROXINE SODIUM 25 MCG PO TABS: 25.0000 ug | ORAL_TABLET | Freq: Every day | ORAL | Status: DC

## 2016-01-17 MED ORDER — HYDROCODONE-ACETAMINOPHEN 10-325 MG PO TABS: 1.0000 | ORAL_TABLET | ORAL | Status: DC | PRN

## 2016-01-17 MED ORDER — GLIPIZIDE 10 MG PO TABS: 10.0000 mg | ORAL_TABLET | Freq: Three times a day (TID) | ORAL | Status: DC

## 2016-01-17 NOTE — Progress Notes (Signed)
Patient ID: Cathy Fuller, female    DOB: 10-10-1961, 55 y.o.   MRN: [id number]  PCP: Olene Floss  Subjective:   Chief Complaint  Patient presents with  . Follow-up    3 months - diabetes  . Medication Refill    mobic - was not on medication list    HPI Presents today for a 3 month diabetes and HTN follow-up.  She reports that she chose to stop taking losartan/HCTZ, metformin, and farxiga on 2/24.   Was diagnosed with DM in 2004. Is currently only taking glipizide  TID, 9am 3pm 9pm. Stopped farxiga because it made her very nauseated, "pee constantly," and urinate several times a night. She also reports that she couldn't get the sugars below 200 while on farxiga.  Has been keeping careful records of her blood sugar and blood pressure, 4x a day. Meter says 14 day average is 118. Highest reported was 370 but relates it to her Asian food intake. Her lowest was 71. Is determined that she can control her sugars with watching her diet and glipizide. She is following a diet similar to the [employer]. Is keeping tight control on her carb and sugar intake.  Denies polyphagia, polydipsia, nocturia, or peripheral neuropathy.  Is diagnosed with OSA. Currently sleeping in a different room without her Cpap. Is planning on moving back into her room after she gets a new bed at which point she will restart her Cpap. Endorses still feeling rested when she wakes up and denies snoring or waking up at night.  Had back surgery on 2/14 to fuse Lumbar 2/3 above her previous fusion. Is unable to exercise for about 3 months.   Her blood pressures have been running 90-110/60-70. The highest she reports is 123/76 and lowest 71/54. Watches her salt intake. Does report some mild headaches and blurry vision. Her vision started getting blurry 4-5 months ago. Hasn't seen an eye doctor for 2 years. Does report that she has had some LLE since stopping losartan/HCTZ.   Has started taking Melobic, an  herbal supplement for cholesterol.   Needs refills for norco, synthroid, and glipizide.    Review of Systems Constitutional: Negative for fever, chills and unexpected weight change.  Respiratory: Negative for cough and shortness of breath.  Cardiovascular: Positive for leg swelling. Negative for chest pain.  Gastrointestinal: Positive for constipation. Negative for nausea, vomiting, abdominal pain and diarrhea.  Endocrine: Negative for cold intolerance, heat intolerance, polydipsia and polyuria.  Genitourinary: Negative for dysuria, urgency, frequency and difficulty urinating.  Musculoskeletal: Positive for back pain.  Neurological: Positive for headaches. Negative for dizziness, light-headedness and numbness.     Patient Active Problem List   Diagnosis Date Noted  . Spondylolisthesis of lumbar region 12/27/2015  . Essential hypertension   . Diabetes mellitus with complication (HCC)   . Hyperglycemia   . S/P spinal fusion   . Obesity (BMI 30-39.9) 05/04/2014  . OSA (obstructive sleep apnea) 10/27/2013  . Hypertension 01/30/2012  . Hypothyroidism 01/30/2012  . DM (diabetes mellitus), type 2, uncontrolled (HCC) 05/29/2010  . BARRETTS ESOPHAGUS 05/29/2010  . FATTY LIVER DISEASE 05/29/2010  . COLONIC POLYPS, ADENOMATOUS, HX OF 05/29/2010     Prior to Admission medications   Medication Sig Start Date End Date Taking? Authorizing Provider  cyclobenzaprine (FLEXERIL) 10 MG tablet Take 1 tablet (10 mg total) by mouth 3 (three) times daily as needed. 10/18/15  Yes Chanci Ojala, PA-C  famotidine (PEPCID) 20 MG tablet Take 40 mg by mouth at  bedtime.    Yes Historical Provider, MD  glipiZIDE (GLUCOTROL) 10 MG tablet Take 1 tablet (10 mg total) by mouth 2 (two) times daily before a meal. 07/12/15  Yes Saron Tweed, PA-C  HYDROcodone-acetaminophen (NORCO) 10-325 MG tablet Take 1-2 tablets by mouth every 4 (four) hours as needed for severe pain. 12/28/15  Yes Loura HaltBenjamin Jared Ditty, MD    ketoconazole (NIZORAL) 2 % cream Apply 1 application topically daily. 02/11/14  Yes Anola Mcgough, PA-C  KRILL OIL PO Take 500 mg by mouth daily.    Yes Historical Provider, MD  levothyroxine (SYNTHROID, LEVOTHROID) 25 MCG tablet Take 1 tablet (25 mcg total) by mouth daily. 07/12/15  Yes Elder Davidian, PA-C  Meloxicam (MOBIC PO) Take by mouth.   Yes Historical Provider, MD  milk thistle 175 MG tablet Take 175 mg by mouth daily.   Yes Historical Provider, MD  Turmeric Curcumin 500 MG CAPS Take 1,500 mg by mouth daily.   Yes Historical Provider, MD     Allergies  Allergen Reactions  . Aspirin Tinitus  . Tape Other (See Comments)    Blisters       Objective:  Physical Exam  Constitutional: She is oriented to person, place, and time. She appears well-developed and well-nourished. No distress.  BP 112/75 mmHg  Pulse 71  Temp(Src) 98.1 F (36.7 C) (Oral)  Resp 16  Ht 5' 4.25" (1.632 m)  Wt 226 lb (102.513 kg)  BMI 38.49 kg/m2  SpO2 98%   Eyes: Conjunctivae are normal. No scleral icterus.  Neck: No thyromegaly present.  Cardiovascular: Normal rate, regular rhythm, normal heart sounds and intact distal pulses.   Pulmonary/Chest: Effort normal and breath sounds normal.  Lymphadenopathy:    She has no cervical adenopathy.  Neurological: She is alert and oriented to person, place, and time.  Skin: Skin is warm and dry.  Psychiatric: She has a normal mood and affect. Her speech is normal and behavior is normal.       Results for orders placed or performed in visit on 01/17/16  POCT glucose (manual entry)  Result Value Ref Range   POC Glucose 76 70 - 99 mg/dl  POCT glycosylated hemoglobin (Hb A1C)  Result Value Ref Range   Hemoglobin A1C 8.0        Assessment & Plan:   1. Controlled type 2 diabetes mellitus without complication, without long-term current use of insulin (HCC) Much improved. Surprisingly. Continue her current regimen. - POCT glucose (manual entry) - POCT  glycosylated hemoglobin (Hb A1C) - Comprehensive metabolic panel - glipiZIDE (GLUCOTROL) 10 MG tablet; Take 1 tablet (10 mg total) by mouth 3 (three) times daily before meals.  Dispense: 270 tablet; Refill: 3  2. Essential hypertension Controlled, also surprisingly. Continue current regimen. Does not want to take losartan, despite reno-protective benefits. - CBC with Differential/Platelet - Comprehensive metabolic panel  3. Hyperlipidemia Await lab results. - Lipid panel - Comprehensive metabolic panel  4. Hypothyroidism, unspecified hypothyroidism type Await lab results. - TSH - levothyroxine (SYNTHROID, LEVOTHROID) 25 MCG tablet; Take 1 tablet (25 mcg total) by mouth daily.  Dispense: 90 tablet; Refill: 3  5. Edema of both legs If renal function is normal, would start chlorthalidone PRN. - Comprehensive metabolic panel  6. Low back pain, unspecified back pain laterality, with sciatica presence unspecified S/p fusion. Would prefer that this treatment come from her surgeon, but as she is here, will provide. - HYDROcodone-acetaminophen (NORCO) 10-325 MG tablet; Take 1-2 tablets by mouth every 4 (four)  hours as needed for severe pain.  Dispense: 90 tablet; Refill: 0   Return in about 3 months (around 04/18/2016).   Fernande Bras, PA-C Physician Assistant-Certified [facility]

## 2016-01-17 NOTE — Progress Notes (Signed)
Subjective:    Patient ID: Cathy Fuller, female    DOB: [birth year], 55 y.o.   MRN: [id number] Chief Complaint  Patient presents with  . Follow-up    3 months - diabetes   HPI  Presents today for a 3 month diabetes and HTN check up.  She reports that she chose to stop taking losartan/HCTZ, metformin, and farxiga on 2/24.   Was diagnosed with DM in 2004. Is currently only taking glipizide  TID, 9am 3pm 9pm. Stopped farxiga because it made her very nauseated, "pee constantly," and urinate several times a night. She also reports that she couldn't get the sugars below 200 while on farxiga.  Has been keeping careful records of her blood sugar and blood pressure, 4x a day. Meter says 14 day average is 118. Highest reported was 370 but relates it to her oriental food intake. Her lowest was 71. Is determine that she can control her sugars with watching her diet and glipizide. She is following close to a Atkins diet. Is keeping tight control on her carb and sugar intake.  Denies polyphagia, polydipsia, nocturia, or peripheral neuropathy.  Is diagnosed with OSA. Currently sleeping in a different room without her Cpap. Is planning on moving back into her room after she gets a new bed at which point she will restart her Cpap. Endorses still feeling rested when she wakes up and denies snoring or waking up at night.  Had back surgery on 2/14 to fuse Lumbar 2/3 above her previous fusion. Is unable to exercise for about 3 months.   Her blood pressures have been running 90-110/60-70. The highest she reports is 123/76 and lowest 71/54. Watches her salt intake. Does report some mild headaches and blurry vision. Her vision started getting blurry 4-5 months ago. Hasn't seen an eye doctor for 2 years. Does report that she has had some LLE since stopping losartan/HCTZ.   Has started taking Melobic, an herbal supplement for cholesterol.   Needs refills for norco, synthroid, and glipizide.   PMH, FH, and  SH were all reviewed with patient and updated as needed. Allergies  Allergen Reactions  . Aspirin   . Tape Other (See Comments)    Blisters   Prior to Admission medications   Medication Sig Start Date End Date Taking? Authorizing Provider  cyclobenzaprine (FLEXERIL) 10 MG tablet Take 1 tablet (10 mg total) by mouth 3 (three) times daily as needed. 10/18/15  Yes [name] Jeffery, PA-C  famotidine (PEPCID) 20 MG tablet Take 40 mg by mouth at bedtime.    Yes Historical Provider, MD  glipiZIDE (GLUCOTROL) 10 MG tablet Take 1 tablet (10 mg total) by mouth 2 times daily before a meal. 07/12/15  Yes [name] Jeffery, PA-C  HYDROcodone-acetaminophen (NORCO) 10-325 MG tablet Take 1-2 tablets by mouth every 4 (four) hours as needed for severe pain. 12/28/15  Yes Loura Halt Ditty, MD  ketoconazole (NIZORAL) 2 % cream Apply 1 application topically daily. 02/11/14  Yes [name] Jeffery, PA-C  KRILL OIL PO Take 500 mg by mouth daily.    Yes Historical Provider, MD  levothyroxine (SYNTHROID, LEVOTHROID) 25 MCG tablet Take 1 tablet (25 mcg total) by mouth daily. 07/12/15  Yes [name] Jeffery, PA-C  milk thistle 175 MG tablet Take 175 mg by mouth daily.   Yes Historical Provider, MD  Turmeric Curcumin 500 MG CAPS Take 1,500 mg by mouth daily.   Yes Historical Provider, MD    Review of Systems  Constitutional: Negative for fever, chills and  unexpected weight change.  Respiratory: Negative for cough and shortness of breath.   Cardiovascular: Positive for leg swelling. Negative for chest pain.  Gastrointestinal: Positive for constipation. Negative for nausea, vomiting, abdominal pain and diarrhea.  Endocrine: Negative for cold intolerance, heat intolerance, polydipsia and polyuria.  Genitourinary: Negative for dysuria, urgency, frequency and difficulty urinating.  Musculoskeletal: Positive for back pain.  Neurological: Positive for headaches. Negative for dizziness, light-headedness and numbness.       Objective:    Physical Exam  Constitutional: She is oriented to person, place, and time. She appears well-developed and well-nourished. No distress.  Blood pressure 112/75, pulse 71, temperature 98.1 F (36.7 C), temperature source Oral, resp. rate 16, height 5' 4.25" (1.632 m), weight 226 lb (102.513 kg), SpO2 98 %.   HENT:  Head: Normocephalic and atraumatic.  Right Ear: Hearing, tympanic membrane, external ear and ear canal normal.  Left Ear: Hearing, tympanic membrane, external ear and ear canal normal.  Nose: Nose normal.  Mouth/Throat: Oropharynx is clear and moist and mucous membranes are normal. No oropharyngeal exudate.  Eyes: Conjunctivae and EOM are normal. Pupils are equal, round, and reactive to light.  Neck: Normal range of motion and full passive range of motion without pain. Neck supple. No thyromegaly present.  Cardiovascular: Normal rate, regular rhythm, S1 normal, S2 normal, normal heart sounds and intact distal pulses.  Exam reveals no gallop and no friction rub.   No murmur heard. Pulses:      Radial pulses are 2+ on the right side, and 2+ on the left side.       Posterior tibial pulses are 2+ on the right side, and 2+ on the left side.  Pulmonary/Chest: Effort normal. She has no wheezes. She has no rhonchi. She has no rales. She exhibits no tenderness.  Musculoskeletal:  1+to 2 pitting edema bilateral lower extremities  Lymphadenopathy:       Head (right side): No submental, no submandibular, no tonsillar, no preauricular, no posterior auricular and no occipital adenopathy present.       Head (left side): No submental, no submandibular, no tonsillar, no preauricular, no posterior auricular and no occipital adenopathy present.    She has no cervical adenopathy.       Right: No supraclavicular adenopathy present.       Left: No supraclavicular adenopathy present.  Neurological: She is alert and oriented to person, place, and time. She has normal strength.  Reflex Scores:       Bicep reflexes are 2+ on the right side. Skin: Skin is warm and dry. She is not diaphoretic.  Psychiatric: She has a normal mood and affect. Her speech is normal and behavior is normal. Judgment and thought content normal.    Results for orders placed or performed in visit on 01/17/16  POCT glucose (manual entry)  Result Value Ref Range   POC Glucose 76 70 - 99 mg/dl  POCT glycosylated hemoglobin (Hb A1C)  Result Value Ref Range   Hemoglobin A1C 8.0       Assessment & Plan:  1. Controlled type 2 diabetes mellitus without complication, without long-term current use of insulin (HCC) - POCT glucose (manual entry) - POCT glycosylated hemoglobin (Hb A1C) - Comprehensive metabolic panel - glipiZIDE (GLUCOTROL) 10 MG tablet; Take 1 tablet (10 mg total) by mouth 3 (three) times daily before meals.  Dispense: 270 tablet; Refill: 3  2. Essential hypertension - CBC with Differential/Platelet - Comprehensive metabolic panel  3. Hyperlipidemia - Lipid panel -  Comprehensive metabolic panel  4. Hypothyroidism, unspecified hypothyroidism type - TSH - levothyroxine (SYNTHROID, LEVOTHROID) 25 MCG tablet; Take 1 tablet (25 mcg total) by mouth daily.  Dispense: 90 tablet; Refill: 3  5. Edema of both legs - Comprehensive metabolic panel  6. Low back pain, unspecified back pain laterality, with sciatica presence unspecified - HYDROcodone-acetaminophen (NORCO) 10-325 MG tablet; Take 1-2 tablets by mouth every 4 (four) hours as needed for severe pain.  Dispense: 90 tablet; Refill: 0  Will check POC A1C and glucose today. If Improved will continue on her preferred plan of glipized 10mg  TID even though it is higher than the recommended dose. Will check a CBC and CMP to ensure no detrimental effects. Encouraged her to keep up great eating choices and to try to exercise when allowed getting at least 150 mins/week.   Explained that losartan provided some kidney protection (ACEi causes cough) and HCTZ  helped remove excess water and which might be why she is experiencing LLE. Discussed option of starting low dose both for those reasons. Will check lab work before determining best course.   No acute thyroid complaints but will check level to determine efficacy of treatment.   Instructed to return in 3 months for a follow up. Discussed assessment and plan with patient. They expressed their understanding and acceptance.   Hilton CorkS. Dani Janiece Scovill PA-S [facility]

## 2016-01-17 NOTE — Patient Instructions (Signed)
Because you received labwork today, you will receive an invoice from [facility]. Please contact [facility] at [phone] with questions or concerns regarding your invoice. Our billing staff will not be able to assist you with those questions.  You will be contacted with the lab results as soon as they are available. The fastest way to get your results is to activate your My Chart account. Instructions are located on the last page of this paperwork. If you have not heard from us regarding the results in 2 weeks, please contact this office.  Please resume the CPAP as soon as possible. Consider moving it into the room where your are sleeping currently.  Once you can exercise again, do it! Your goal is at least 150 minutes/week.  Keep up the great work with healthy eating choices!

## 2016-01-18 LAB — LIPID PANEL
CHOLESTEROL: 172 mg/dL (ref 125–200)
HDL: 50 mg/dL (ref >=46)
LDL CALC: 98 mg/dL (ref <130)
TRIGLYCERIDES: 119 mg/dL (ref <150)
Total CHOL/HDL Ratio: 3.4 Ratio (ref <=5.0)
VLDL: 24 mg/dL (ref <30)

## 2016-01-18 LAB — COMPREHENSIVE METABOLIC PANEL
ALBUMIN: 4.2 g/dL (ref 3.6–5.1)
ALK PHOS: 83 U/L (ref 33–130)
ALT: 23 U/L (ref 6–29)
AST: 21 U/L (ref 10–35)
BILIRUBIN TOTAL: 0.4 mg/dL (ref 0.2–1.2)
BUN: 27 mg/dL — ABNORMAL HIGH (ref 7–25)
CALCIUM: 9.3 mg/dL (ref 8.6–10.4)
CO2: 22 mmol/L (ref 20–31)
Chloride: 109 mmol/L (ref 98–110)
Creat: 1.23 mg/dL — ABNORMAL HIGH (ref 0.50–1.05)
GLUCOSE: 56 mg/dL — AB (ref 65–99)
POTASSIUM: 4 mmol/L (ref 3.5–5.3)
Sodium: 142 mmol/L (ref 135–146)
Total Protein: 6.9 g/dL (ref 6.1–8.1)

## 2016-01-19 ENCOUNTER — Telehealth: Payer: Self-pay

## 2016-01-19 MED ORDER — CHLORTHALIDONE 25 MG PO TABS: 12.5000 mg | ORAL_TABLET | Freq: Every day | ORAL | Status: DC | PRN

## 2016-01-19 NOTE — Telephone Encounter (Signed)
Meds ordered this encounter  Medications  . chlorthalidone (HYGROTON) 25 MG tablet    Sig: Take 0.5-1 tablets (12.5-25 mg total) by mouth daily as needed (lower extremity edema).    Dispense:  30 tablet    Refill:  1    Order Specific Question:  Supervising Provider    Answer:  DOOLITTLE, ROBERT P [[id number]]

## 2016-01-19 NOTE — Telephone Encounter (Signed)
Pt advised.

## 2016-01-19 NOTE — Telephone Encounter (Signed)
Cathy Fuller 

## 2016-01-19 NOTE — Telephone Encounter (Signed)
Pt saw Cathy Fuller on the 7th and was told that a fluid pill would be called in and it has not   Best number [phone]

## 2016-01-24 ENCOUNTER — Encounter: Payer: BLUE CROSS/BLUE SHIELD | Attending: Physician Assistant

## 2016-01-24 VITALS — Ht 64.5 in | Wt 223.8 lb

## 2016-01-24 DIAGNOSIS — E1165 Type 2 diabetes mellitus with hyperglycemia: Secondary | ICD-10-CM | POA: Insufficient documentation

## 2016-01-24 DIAGNOSIS — E669 Obesity, unspecified: Secondary | ICD-10-CM | POA: Insufficient documentation

## 2016-01-24 DIAGNOSIS — E119 Type 2 diabetes mellitus without complications: Secondary | ICD-10-CM

## 2016-01-25 ENCOUNTER — Other Ambulatory Visit: Payer: Self-pay

## 2016-01-25 MED ORDER — CHLORTHALIDONE 25 MG PO TABS: 12.5000 mg | ORAL_TABLET | Freq: Every day | ORAL | Status: DC | PRN

## 2016-01-25 NOTE — Telephone Encounter (Signed)
[facility] faxed req to change pt's Rx to 90 days. Done.

## 2016-01-30 NOTE — Progress Notes (Signed)
Patient was seen on 01-24-16 for the first of a series of three diabetes self-management courses at the [facility].  Patient Education Plan per assessed needs and concerns is to attend four course education program for Diabetes Self Management Education.  The following learning objectives were met by the patient during this class:  Describe diabetes  State some common risk factors for diabetes  Defines the role of glucose and insulin  Identifies type of diabetes and pathophysiology  Describe the relationship between diabetes and cardiovascular risk  State the members of the Healthcare Team  States the rationale for glucose monitoring  State when to test glucose  State their individual Target Range  State the importance of logging glucose readings  Describe how to interpret glucose readings  Identifies A1C target  Explain the correlation between A1c and eAG values  State symptoms and treatment of high blood glucose  State symptoms and treatment of low blood glucose  Explain proper technique for glucose testing  Identifies proper sharps disposal  Handouts given during class include:  Living Well with Diabetes book  Carb Counting and Meal Planning book  Meal Plan Card  Carbohydrate guide  Meal planning worksheet  Low Sodium Flavoring Tips  The diabetes portion plate  A1c to eAG Conversion Chart  Diabetes Medications  Diabetes Recommended Care Schedule  Support Group  Diabetes Success Plan  Core Class Satisfaction Survey  Follow-Up Plan:  Attend core 2    

## 2016-01-31 DIAGNOSIS — E1165 Type 2 diabetes mellitus with hyperglycemia: Secondary | ICD-10-CM | POA: Diagnosis not present

## 2016-01-31 DIAGNOSIS — E119 Type 2 diabetes mellitus without complications: Secondary | ICD-10-CM

## 2016-01-31 NOTE — Progress Notes (Signed)

## 2016-02-03 ENCOUNTER — Encounter: Payer: Self-pay | Admitting: Physician Assistant

## 2016-02-03 ENCOUNTER — Telehealth: Payer: Self-pay

## 2016-02-03 NOTE — Telephone Encounter (Signed)
Patient was seen on 01/17/16 with Cathy Fuller and states she gave our office a form that day to be faxed back to her insurance. 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 I checked in media and did not see any forms scanned in. She stated we were to fax it to [phone] and then mail her a copy of the form to her address. She states she has not recovered her copy and it shows it has not been received by [employer]. Patient would like a call back from someone in the clinical dept at [phone].

## 2016-02-05 ENCOUNTER — Encounter: Payer: Self-pay | Admitting: Physician Assistant

## 2016-02-06 ENCOUNTER — Encounter: Payer: Self-pay | Admitting: Physician Assistant

## 2016-02-07 ENCOUNTER — Telehealth: Payer: Self-pay | Admitting: *Deleted

## 2016-02-07 DIAGNOSIS — E1165 Type 2 diabetes mellitus with hyperglycemia: Secondary | ICD-10-CM | POA: Diagnosis not present

## 2016-02-07 DIAGNOSIS — E119 Type 2 diabetes mellitus without complications: Secondary | ICD-10-CM

## 2016-02-07 NOTE — Telephone Encounter (Signed)
Biometric screening form lost.  Pt brought another form.  I was sign and faxed.  Pt got her copy and confirmation.

## 2016-02-08 NOTE — Telephone Encounter (Signed)
I can not locate her form

## 2016-02-08 NOTE — Telephone Encounter (Signed)
Please print lab results from her visit on 3/07 and fax to the insurance company the fax number is [phone]

## 2016-02-09 NOTE — Telephone Encounter (Signed)
I have been looking for these forms. No luck.

## 2016-02-14 NOTE — Progress Notes (Signed)
Patient was seen on 02/09/16 for the third of a series of three diabetes self-management courses at the [facility].   Cathy Fuller. State the amount of activity recommended for healthy living . Describe activities suitable for individual needs . Identify ways to regularly incorporate activity into daily life . Identify barriers to activity and ways to over come these barriers  Identify diabetes medications being personally used and their primary action for lowering glucose and possible side effects . Describe role of stress on blood glucose and develop strategies to address psychosocial issues . Identify diabetes complications and ways to prevent them  Explain how to manage diabetes during illness . Evaluate success in meeting personal goal . Establish 2-3 goals that they will plan to diligently work on until they return for the  6461-month follow-up visit  Goals:   I will count my carb choices at most meals and snacks  I will be active 30 minutes or more 7 times a week  I will take my diabetes medications as scheduled  I will test my glucose at least 7 times a day, 2 days a week  I will look at patterns in my record book at least 7 days a month  To help manage stress I will  relax at least 7 times a week  Read a book, play online game  Your patient has identified these potential barriers to change:  Motivation Finances Stress Lack of Family Support  Your patient has identified their diabetes self-care support plan as  Family Support On-line Resources Plan:  Attend Support Group as desired

## 2016-03-18 ENCOUNTER — Encounter: Payer: Self-pay | Admitting: Physician Assistant

## 2016-03-22 ENCOUNTER — Encounter: Payer: Self-pay | Admitting: Physician Assistant

## 2016-03-22 NOTE — Telephone Encounter (Signed)
[name] sent message back to pt on prev email.

## 2016-03-28 ENCOUNTER — Encounter: Payer: Self-pay | Admitting: Family Medicine

## 2016-04-17 ENCOUNTER — Ambulatory Visit (INDEPENDENT_AMBULATORY_CARE_PROVIDER_SITE_OTHER): Payer: BLUE CROSS/BLUE SHIELD | Admitting: Physician Assistant

## 2016-04-17 ENCOUNTER — Encounter: Payer: Self-pay | Admitting: Physician Assistant

## 2016-04-17 VITALS — BP 118/86 | HR 82 | Temp 99.0°F | Resp 16 | Ht 64.5 in | Wt 206.0 lb

## 2016-04-17 DIAGNOSIS — E119 Type 2 diabetes mellitus without complications: Secondary | ICD-10-CM

## 2016-04-17 DIAGNOSIS — M79662 Pain in left lower leg: Secondary | ICD-10-CM | POA: Diagnosis not present

## 2016-04-17 DIAGNOSIS — M545 Low back pain: Secondary | ICD-10-CM

## 2016-04-17 DIAGNOSIS — M25562 Pain in left knee: Secondary | ICD-10-CM | POA: Diagnosis not present

## 2016-04-17 DIAGNOSIS — I10 Essential (primary) hypertension: Secondary | ICD-10-CM | POA: Diagnosis not present

## 2016-04-17 DIAGNOSIS — E669 Obesity, unspecified: Secondary | ICD-10-CM | POA: Diagnosis not present

## 2016-04-17 LAB — CBC WITH DIFFERENTIAL/PLATELET
BASOS PCT: 0 %
Basophils Absolute: 0 cells/uL (ref 0–200)
EOS ABS: 268 {cells}/uL (ref 15–500)
Eosinophils Relative: 4 %
HEMATOCRIT: 37.3 % (ref 35.0–45.0)
Hemoglobin: 12 g/dL (ref 11.7–15.5)
Lymphocytes Relative: 28 %
Lymphs Abs: 1876 cells/uL (ref 850–3900)
MCH: 28 pg (ref 27.0–33.0)
MCHC: 32.2 g/dL (ref 32.0–36.0)
MCV: 86.9 fL (ref 80.0–100.0)
MONO ABS: 335 {cells}/uL (ref 200–950)
MONOS PCT: 5 %
MPV: 11 fL (ref 7.5–12.5)
NEUTROS ABS: 4221 {cells}/uL (ref 1500–7800)
Neutrophils Relative %: 63 %
PLATELETS: 199 10*3/uL (ref 140–400)
RBC: 4.29 MIL/uL (ref 3.80–5.10)
RDW: 14.5 % (ref 11.0–15.0)
WBC: 6.7 10*3/uL (ref 3.8–10.8)

## 2016-04-17 LAB — POCT GLYCOSYLATED HEMOGLOBIN (HGB A1C): Hemoglobin A1C: 6.5

## 2016-04-17 LAB — COMPREHENSIVE METABOLIC PANEL
ALBUMIN: 4.3 g/dL (ref 3.6–5.1)
ALT: 17 U/L (ref 6–29)
AST: 19 U/L (ref 10–35)
Alkaline Phosphatase: 84 U/L (ref 33–130)
BUN: 18 mg/dL (ref 7–25)
CALCIUM: 9.5 mg/dL (ref 8.6–10.4)
CHLORIDE: 107 mmol/L (ref 98–110)
CO2: 25 mmol/L (ref 20–31)
Creat: 1.07 mg/dL — ABNORMAL HIGH (ref 0.50–1.05)
Glucose, Bld: 98 mg/dL (ref 65–99)
Potassium: 4 mmol/L (ref 3.5–5.3)
Sodium: 142 mmol/L (ref 135–146)
Total Bilirubin: 0.4 mg/dL (ref 0.2–1.2)
Total Protein: 7 g/dL (ref 6.1–8.1)

## 2016-04-17 MED ORDER — HYDROCODONE-ACETAMINOPHEN 10-325 MG PO TABS: 1.0000 | ORAL_TABLET | ORAL | Status: DC | PRN

## 2016-04-17 MED ORDER — LOSARTAN POTASSIUM 25 MG PO TABS: 25.0000 mg | ORAL_TABLET | Freq: Every day | ORAL | Status: DC

## 2016-04-17 MED ORDER — MELOXICAM 15 MG PO TABS: 15.0000 mg | ORAL_TABLET | Freq: Every day | ORAL | Status: DC

## 2016-04-17 NOTE — Patient Instructions (Signed)
     IF you received an x-ray today, you will receive an invoice from [facility]. Please contact [facility] at [phone] with questions or concerns regarding your invoice.   IF you received labwork today, you will receive an invoice from [facility]. Please contact [facility] at [phone] with questions or concerns regarding your invoice.   Our billing staff will not be able to assist you with questions regarding bills from these companies.  You will be contacted with the lab results as soon as they are available. The fastest way to get your results is to activate your My Chart account. Instructions are located on the last page of this paperwork. If you have not heard from us regarding the results in 2 weeks, please contact this office.      

## 2016-04-17 NOTE — Progress Notes (Signed)
Patient ID: Cathy SquibbJanelle Froelich, female    DOB: Nov 22, 1960, [age] y.o.   MRN: [id number]  PCP: Olene FlossJEFFERY,Joshuajames Moehring, PA-C  Subjective:   Chief Complaint  Patient presents with  . Follow-up  . Diabetes  . Hypertension  . swelling legs    per pt "swelling in legs have gone done"  . Cpap    pt states "she is using"  . Hypothyroidism  . Medication Refill    Hydrocodone-Acetaminophen 10-325 mg    HPI Presents for evaluation of diabetes, HTN and medication refills.  Feels good, tolerating medications without difficulty, has nearly eliminated carbs from her diet and has lost weight about 20 lbs. She's back at the gym, released by neurosurgery following lateral body fusion in 12/2015. She doesn't like the excess skin she now has around her middle, but doesn't want to pursue surgical intervention yet. Home glucose readings BID 100-200.  Monitors BP at home, and it's high. Stopped chlorthalidone and Hyzaar after her last visit, having read on WebMD that they can interfere with the effectiveness of glipizide. She desires to restart lisinopril. It was stopped due to cough, but she notes that it didn't really bother her that much, just bothered her husband.    Review of Systems As above. Denies chest pain, shortness of breath, HA, dizziness, vision change, nausea, vomiting, diarrhea, constipation, melena, hematochezia, dysuria, increased urinary urgency or frequency, increased hunger or thirst, unintentional weight change, unexplained myalgias or arthralgias, rash.     Patient Active Problem List   Diagnosis Date Noted  . Hyperlipidemia 01/17/2016  . Spondylolisthesis of lumbar region 12/27/2015  . Essential hypertension   . S/P spinal fusion   . Obesity (BMI 30-39.9) 05/04/2014  . OSA (obstructive sleep apnea) 10/27/2013  . Hypothyroidism 01/30/2012  . Diabetes mellitus type 2, controlled, without complications (HCC) 05/29/2010  . BARRETTS ESOPHAGUS 05/29/2010  . FATTY LIVER DISEASE 05/29/2010    . COLONIC POLYPS, ADENOMATOUS, HX OF 05/29/2010     Prior to Admission medications   Medication Sig Start Date End Date Taking? Authorizing Provider  cyclobenzaprine (FLEXERIL) 10 MG tablet Take 1 tablet (10 mg total) by mouth 3 (three) times daily as needed. 10/18/15  Yes Levita Monical, PA-C  famotidine (PEPCID) 20 MG tablet Take 40 mg by mouth at bedtime.    Yes Historical Provider, MD  glipiZIDE (GLUCOTROL) 10 MG tablet Take 1 tablet (10 mg total) by mouth 3 (three) times daily before meals. Patient taking differently: Take 10 mg by mouth 2 (two) times daily before a meal.  01/17/16  Yes Bri Wakeman, PA-C  HYDROcodone-acetaminophen (NORCO) 10-325 MG tablet Take 1-2 tablets by mouth every 4 (four) hours as needed for severe pain. 01/17/16  Yes Liylah Najarro, PA-C  KRILL OIL PO Take 500 mg by mouth daily.    Yes Historical Provider, MD  levothyroxine (SYNTHROID, LEVOTHROID) 25 MCG tablet Take 1 tablet (25 mcg total) by mouth daily. 01/17/16  Yes Kdyn Vonbehren, PA-C  milk thistle 175 MG tablet Take 175 mg by mouth daily. Reported on 04/17/2016   Yes Historical Provider, MD  Turmeric Curcumin 500 MG CAPS Take 1,500 mg by mouth daily.   Yes Historical Provider, MD  chlorthalidone (HYGROTON) 25 MG tablet Take 0.5-1 tablets (12.5-25 mg total) by mouth daily as needed (lower extremity edema). Patient not taking: Reported on 04/17/2016 01/25/16   Porfirio Oarhelle Kierre Deines, PA-C  dapagliflozin propanediol (FARXIGA) 5 MG TABS tablet Take 5 mg by mouth daily. Reported on 04/17/2016    Historical Provider, MD  ketoconazole (NIZORAL) 2 % cream Apply 1 application topically daily. Patient not taking: Reported on 04/17/2016 02/11/14   Porfirio Oar, PA-C  losartan-hydrochlorothiazide (HYZAAR) 100-12.5 MG tablet Take 1 tablet by mouth daily. Reported on 04/17/2016    Historical Provider, MD  Meloxicam (MOBIC PO) Take by mouth. Reported on 04/17/2016    Historical Provider, MD     Allergies  Allergen Reactions  . Aspirin  Tinitus  . Tape Other (See Comments)    Blisters       Objective:  Physical Exam  Constitutional: She is oriented to person, place, and time. She appears well-developed and well-nourished. She is active and cooperative. No distress.  BP 118/86 mmHg  Pulse 82  Temp(Src) 99 F (37.2 C) (Oral)  Resp 16  Ht 5' 4.5" (1.638 m)  Wt 206 lb (93.441 kg)  BMI 34.83 kg/m2  SpO2 98%  HENT:  Head: Normocephalic and atraumatic.  Right Ear: Hearing normal.  Left Ear: Hearing normal.  Eyes: Conjunctivae are normal. No scleral icterus.  Neck: Normal range of motion. Neck supple. No thyromegaly present.  Cardiovascular: Normal rate, regular rhythm and normal heart sounds.   Pulses:      Radial pulses are 2+ on the right side, and 2+ on the left side.  Pulmonary/Chest: Effort normal and breath sounds normal.  Musculoskeletal:       Left knee: She exhibits normal range of motion, no swelling, no effusion, no ecchymosis, no deformity, no laceration, no erythema, normal alignment, no LCL laxity, normal patellar mobility, no bony tenderness, normal meniscus and no MCL laxity. Tenderness found. Lateral joint line tenderness noted. No medial joint line, no MCL, no LCL and no patellar tendon tenderness noted.  Lymphadenopathy:       Head (right side): No tonsillar, no preauricular, no posterior auricular and no occipital adenopathy present.       Head (left side): No tonsillar, no preauricular, no posterior auricular and no occipital adenopathy present.    She has no cervical adenopathy.       Right: No supraclavicular adenopathy present.       Left: No supraclavicular adenopathy present.  Neurological: She is alert and oriented to person, place, and time. No sensory deficit.  Skin: Skin is warm, dry and intact. No rash noted. No cyanosis or erythema. Nails show no clubbing.  Psychiatric: She has a normal mood and affect. Her speech is normal and behavior is normal.    Wt Readings from Last 3  Encounters:  04/17/16 206 lb (93.441 kg)  01/30/16 223 lb 12.8 oz (101.515 kg)  01/17/16 226 lb (102.513 kg)    Results for orders placed or performed in visit on 04/17/16  POCT glycosylated hemoglobin (Hb A1C)  Result Value Ref Range   Hemoglobin A1C 6.5           Assessment & Plan:   1. Controlled type 2 diabetes mellitus without complication, without long-term current use of insulin (HCC) Good control. Continue her healthy lifestyle changes and current medications. - HM Diabetes Foot Exam - POCT glycosylated hemoglobin (Hb A1C) - Comprehensive metabolic panel  2. Obesity (BMI 30-39.9) Congratulated on her efforts for healthy lifestyle changes.  3. Essential hypertension Restart treatment, with losartan. - CBC with Differential/Platelet - losartan (COZAAR) 25 MG tablet; Take 1 tablet (25 mg total) by mouth daily.  Dispense: 90 tablet; Refill: 3  4. Pain of left lower leg 5. Low back pain, unspecified back pain laterality, with sciatica presence unspecified 6. Left knee pain  Continue current treatment. Refill medications. - meloxicam (MOBIC) 15 MG tablet; Take 1 tablet (15 mg total) by mouth daily. Reported on 04/17/2016  Dispense: 90 tablet; Refill: 0 - HYDROcodone-acetaminophen (NORCO) 10-325 MG tablet; Take 1-2 tablets by mouth every 4 (four) hours as needed for severe pain.  Dispense: 90 tablet; Refill: 0   Fernande Bras, PA-C Physician Assistant-Certified [facility]

## 2016-04-17 NOTE — Progress Notes (Signed)
Subjective:    Patient ID: Cathy Fuller, female    DOB: 09/26/1961, 55 y.o.   MRN: [id number]  Chief Complaint  Patient presents with  . Follow-up  . Diabetes  . Hypertension  . swelling legs    per pt "swelling in legs have gone done"  . Cpap    pt states "she is using"  . Hypothyroidism  . Medication Refill    Hydrocodone-Acetaminophen 10-325 mg    HPI  Cathy Fuller is a 55yo female who presents today for follow-up and medical management of type 2 diabetes, and hypertension.  Patient was diagnosed with type 2 DM 13 years ago.  Asymptomatic on current daily regimen of Farxiga 49m and glipizide 156m  Checks her blood sugars BID with values ranging from 100-120.   Patient monitors BP at home.  Stopped taking chlorthalidone and Hyzaar on 03/18/16 "because it interferes with the effectiveness of my diabetes medication."  Would like to start back to lisinopril, because it does not effect efficacy of other medications, states "I know I got cough from it but it didn't bother me, just my husband."  Loss of 20lbs in the last year.  Patient unhappy with excess skin but does not want to seek out corrective treatment at this time.  Complains of nerve pain in left calf, that starts at the knee joint and runs down the shin and lateral side of the left leg.  Chronic problem ongoing since injury to knee August 2016, when she twisted it.  Admits to occasional edema, especially left leg and constipation for last week, but had a BM this morning.  Denies chest pain, SOB, polydipsia, polyphagia, polyuria, nocturia, tingling or numbness of extremities, headache, change in vision, or fatigue different from baseline.  Interval History: Since last visit saw nutritionist, original diet plan spiked her sugars because of increased carbs, met with them further and decided on a low carb diet she was implementing previously.  Per patient neuro surgeon recently released her back to gym.  Review of EMR  indicates patient underwent lateral interbody fusion and fixation with lateral plate of L2S5-6n Feb 2017.  No notes to verify patient has been cleared for activity.   Review of Systems  All others negative except those listed in HPI.  Patient Active Problem List   Diagnosis Date Noted  . Hyperlipidemia 01/17/2016  . Spondylolisthesis of lumbar region 12/27/2015  . Essential hypertension   . S/P spinal fusion   . Obesity (BMI 30-39.9) 05/04/2014  . OSA (obstructive sleep apnea) 10/27/2013  . Hypothyroidism 01/30/2012  . Diabetes mellitus type 2, controlled, without complications ([id number]. BARRETTS ESOPHAGUS 05/29/2010  . FATTY LIVER DISEASE 05/29/2010  . COLONIC POLYPS, ADENOMATOUS, HX OF 05/29/2010    Current Outpatient Prescriptions on File Prior to Visit  Medication Sig Dispense Refill  . cyclobenzaprine (FLEXERIL) 10 MG tablet Take 1 tablet (10 mg total) by mouth 3 (three) times daily as needed. 90 tablet 1  . famotidine (PEPCID) 20 MG tablet Take 40 mg by mouth at bedtime.     . Marland KitchenlipiZIDE (GLUCOTROL) 10 MG tablet Take 1 tablet (10 mg total) by mouth 3 (three) times daily before meals. (Patient taking differently: Take 10 mg by mouth 2 (two) times daily before a meal. ) 270 tablet 3  . KRILL OIL PO Take 500 mg by mouth daily.     . Marland Kitchenevothyroxine (SYNTHROID, LEVOTHROID) 25 MCG tablet Take 1 tablet (25 mcg total) by mouth daily. 90 tablet  3  . milk thistle 175 MG tablet Take 175 mg by mouth daily. Reported on 04/17/2016    . Turmeric Curcumin 500 MG CAPS Take 1,500 mg by mouth daily.    . dapagliflozin propanediol (FARXIGA) 5 MG TABS tablet Take 5 mg by mouth daily. Reported on 04/17/2016    . ketoconazole (NIZORAL) 2 % cream Apply 1 application topically daily. (Patient not taking: Reported on 04/17/2016) 60 g 3  . [DISCONTINUED] lisinopril (PRINIVIL,ZESTRIL) 10 MG tablet Take 10 mg by mouth daily.     No current facility-administered medications on file prior to visit.    Allergies  Allergen Reactions  . Aspirin Tinitus  . Tape Other (See Comments)    Blisters      Objective:   Physical Exam  Constitutional: She is oriented to person, place, and time. She appears well-developed and well-nourished.  Eyes: Conjunctivae are normal. Pupils are equal, round, and reactive to light.  Neck: Normal range of motion. Neck supple. No thyromegaly present.  Cardiovascular: Normal rate, regular rhythm and normal heart sounds.   Pulses:      Dorsalis pedis pulses are 2+ on the right side, and 1+ on the left side.       Posterior tibial pulses are 2+ on the right side, and 1+ on the left side.  Pulmonary/Chest: Effort normal and breath sounds normal.  Musculoskeletal:       Left knee: Tenderness found. Lateral joint line tenderness noted.  Neurological: She is alert and oriented to person, place, and time.  Skin: Skin is warm and dry.  Psychiatric: She has a normal mood and affect. Her behavior is normal. Judgment and thought content normal.        Assessment & Plan:  1. Controlled type 2 diabetes mellitus without complication, without long-term current use of insulin (HCC) Stable, well controlled on current treatment.  A1C = 6.5.   CMP results pending, compare to previous labs to ensure abnormalities trending down.  Further management and evaluation if indicated. - HM Diabetes Foot Exam - POCT glycosylated hemoglobin (Hb A1C) - Comprehensive metabolic panel  2. Obesity (BMI 30-39.9) Patient encourage to continue low carb diet and exercise, if cleared by neurology, 62mn/day x 5 days a week.    3. Essential hypertension Discontinued medications patient was not in agreement with, discussed other options including ACEI and ARB.  Recommended against use of ACEI, explaining that cough development is allergic reaction which may or may not lead to a more serious reaction in the future.  Compared drug interactions of her medications and losartan, which indicated  none.  Patient in agreement to begin losartan 247mdaily. -CBC, results pending, following trends from last labs to ensure normalization, will manage as indicated from results. - losartan (COZAAR) 25 MG tablet; Take 1 tablet (25 mg total) by mouth daily.  Dispense: 90 tablet; Refill: 3  4. Pain of left lower leg 5. Low back pain, unspecified back pain laterality, with sciatica presence unspecified - meloxicam (MOBIC) 15 MG tablet; Take 1 tablet (15 mg total) by mouth daily. Reported on 04/17/2016  Dispense: 90 tablet; Refill: 0 - HYDROcodone-acetaminophen (NORCO) 10-325 MG tablet; Take 1-2 tablets by mouth every 4 (four) hours as needed for severe pain.  Dispense: 90 tablet; Refill: 0 6. Left knee pain - meloxicam (MOBIC) 15 MG tablet; Take 1 tablet (15 mg total) by mouth daily. Reported on 04/17/2016  Dispense: 90 tablet; Refill: 0 - HYDROcodone-acetaminophen (NORCO) 10-325 MG tablet; Take 1-2 tablets by mouth  every 4 (four) hours as needed for severe pain.  Dispense: 90 tablet; Refill: 0  Recommended patient restart taking meloxicam for pain relief, with food to reduce possible side effects.  It may help reduce the amount of Norco needed.  Refilled meloxicam and norco prescriptions.   Patient instructed to follow up in 3-4 months, if questions or concerns arise prior to that time please contact the office sooner.  Therin Vetsch P. Blima Jaimes, PA-S

## 2016-07-18 ENCOUNTER — Telehealth: Payer: Self-pay

## 2016-07-18 DIAGNOSIS — E039 Hypothyroidism, unspecified: Secondary | ICD-10-CM

## 2016-07-18 DIAGNOSIS — I10 Essential (primary) hypertension: Secondary | ICD-10-CM

## 2016-07-18 DIAGNOSIS — E119 Type 2 diabetes mellitus without complications: Secondary | ICD-10-CM

## 2016-07-18 NOTE — Telephone Encounter (Signed)
Pt was visiting with her parents in [place] and left all her meds. She  would like a refill on her levothyroxine (SYNTHROID, LEVOTHROID) 25 MCG tablet [[id number]][[id number]], glipiZIDE (GLUCOTROL) 10 MG tablet [[id number]],[id number]],losartan (COZAAR) 25 MG tablet [[id number]][[id number]] and  HYDROcodone-acetaminophen (NORCO) 10-325 MG tablet [[id number]][[id number]]. She would like a months worth, because she is going back at the end of this month to [place]. She would like us to use [facility] [address]. Please advise at [phone]

## 2016-07-20 NOTE — Telephone Encounter (Signed)
I fine with everything but norco. Deliah BostonMichael Clark, MS, PA-C 1:47 PM, 07/20/2016

## 2016-07-20 NOTE — Telephone Encounter (Signed)
Ok to refill 

## 2016-07-21 MED ORDER — GLIPIZIDE 10 MG PO TABS: 10.0000 mg | ORAL_TABLET | Freq: Three times a day (TID) | ORAL | 0 refills | Status: DC

## 2016-07-21 MED ORDER — LOSARTAN POTASSIUM 25 MG PO TABS: 25.0000 mg | ORAL_TABLET | Freq: Every day | ORAL | 0 refills | Status: DC

## 2016-07-21 MED ORDER — LEVOTHYROXINE SODIUM 25 MCG PO TABS: 25.0000 ug | ORAL_TABLET | Freq: Every day | ORAL | 0 refills | Status: DC

## 2016-07-21 NOTE — Telephone Encounter (Signed)
sent 

## 2016-07-31 ENCOUNTER — Encounter: Payer: Self-pay | Admitting: Physician Assistant

## 2016-07-31 ENCOUNTER — Ambulatory Visit (INDEPENDENT_AMBULATORY_CARE_PROVIDER_SITE_OTHER): Payer: BLUE CROSS/BLUE SHIELD | Admitting: Physician Assistant

## 2016-07-31 VITALS — BP 120/76 | HR 67 | Temp 97.8°F | Resp 18 | Ht 64.5 in | Wt 197.0 lb

## 2016-07-31 DIAGNOSIS — Z79899 Other long term (current) drug therapy: Secondary | ICD-10-CM

## 2016-07-31 DIAGNOSIS — E785 Hyperlipidemia, unspecified: Secondary | ICD-10-CM

## 2016-07-31 DIAGNOSIS — L02214 Cutaneous abscess of groin: Secondary | ICD-10-CM

## 2016-07-31 DIAGNOSIS — E039 Hypothyroidism, unspecified: Secondary | ICD-10-CM

## 2016-07-31 DIAGNOSIS — E119 Type 2 diabetes mellitus without complications: Secondary | ICD-10-CM | POA: Diagnosis not present

## 2016-07-31 DIAGNOSIS — M545 Low back pain: Secondary | ICD-10-CM | POA: Diagnosis not present

## 2016-07-31 DIAGNOSIS — R5383 Other fatigue: Secondary | ICD-10-CM

## 2016-07-31 DIAGNOSIS — M4316 Spondylolisthesis, lumbar region: Secondary | ICD-10-CM | POA: Diagnosis not present

## 2016-07-31 DIAGNOSIS — Z23 Encounter for immunization: Secondary | ICD-10-CM | POA: Diagnosis not present

## 2016-07-31 DIAGNOSIS — I10 Essential (primary) hypertension: Secondary | ICD-10-CM

## 2016-07-31 DIAGNOSIS — M25562 Pain in left knee: Secondary | ICD-10-CM | POA: Diagnosis not present

## 2016-07-31 DIAGNOSIS — Z79891 Long term (current) use of opiate analgesic: Secondary | ICD-10-CM | POA: Insufficient documentation

## 2016-07-31 LAB — COMPREHENSIVE METABOLIC PANEL
ALT: 10 U/L (ref 6–29)
AST: 14 U/L (ref 10–35)
Albumin: 4.4 g/dL (ref 3.6–5.1)
Alkaline Phosphatase: 76 U/L (ref 33–130)
BILIRUBIN TOTAL: 0.5 mg/dL (ref 0.2–1.2)
BUN: 24 mg/dL (ref 7–25)
CALCIUM: 9.8 mg/dL (ref 8.6–10.4)
CO2: 24 mmol/L (ref 20–31)
Chloride: 109 mmol/L (ref 98–110)
Creat: 1.1 mg/dL — ABNORMAL HIGH (ref 0.50–1.05)
GLUCOSE: 72 mg/dL (ref 65–99)
POTASSIUM: 4.2 mmol/L (ref 3.5–5.3)
Sodium: 142 mmol/L (ref 135–146)
TOTAL PROTEIN: 7.5 g/dL (ref 6.1–8.1)

## 2016-07-31 LAB — CBC WITH DIFFERENTIAL/PLATELET
BASOS PCT: 0 %
Basophils Absolute: 0 cells/uL (ref 0–200)
EOS ABS: 176 {cells}/uL (ref 15–500)
Eosinophils Relative: 2 %
HEMATOCRIT: 38.1 % (ref 35.0–45.0)
HEMOGLOBIN: 12.2 g/dL (ref 11.7–15.5)
LYMPHS ABS: 2640 {cells}/uL (ref 850–3900)
Lymphocytes Relative: 30 %
MCH: 29.1 pg (ref 27.0–33.0)
MCHC: 32 g/dL (ref 32.0–36.0)
MCV: 90.9 fL (ref 80.0–100.0)
MONO ABS: 440 {cells}/uL (ref 200–950)
MPV: 10.6 fL (ref 7.5–12.5)
Monocytes Relative: 5 %
NEUTROS ABS: 5544 {cells}/uL (ref 1500–7800)
Neutrophils Relative %: 63 %
Platelets: 184 10*3/uL (ref 140–400)
RBC: 4.19 MIL/uL (ref 3.80–5.10)
RDW: 15.2 % — AB (ref 11.0–15.0)
WBC: 8.8 10*3/uL (ref 3.8–10.8)

## 2016-07-31 LAB — LIPID PANEL
CHOL/HDL RATIO: 4.5 ratio (ref <=5.0)
Cholesterol: 231 mg/dL — ABNORMAL HIGH (ref 125–200)
HDL: 51 mg/dL (ref >=46)
LDL CALC: 147 mg/dL — AB (ref <130)
Triglycerides: 163 mg/dL — ABNORMAL HIGH (ref <150)
VLDL: 33 mg/dL — ABNORMAL HIGH (ref <30)

## 2016-07-31 LAB — GLUCOSE, POCT (MANUAL RESULT ENTRY): POC Glucose: 78 mg/dl (ref 70–99)

## 2016-07-31 LAB — POCT GLYCOSYLATED HEMOGLOBIN (HGB A1C): Hemoglobin A1C: 6.1

## 2016-07-31 MED ORDER — DAPAGLIFLOZIN PROPANEDIOL 5 MG PO TABS: 5.0000 mg | ORAL_TABLET | Freq: Every day | ORAL | 3 refills | Status: DC

## 2016-07-31 MED ORDER — HYDROCODONE-ACETAMINOPHEN 10-325 MG PO TABS: 1.0000 | ORAL_TABLET | ORAL | 0 refills | Status: DC | PRN

## 2016-07-31 MED ORDER — CYCLOBENZAPRINE HCL 10 MG PO TABS: 10.0000 mg | ORAL_TABLET | Freq: Three times a day (TID) | ORAL | 1 refills | Status: DC | PRN

## 2016-07-31 MED ORDER — NYSTATIN 100000 UNIT/GM EX POWD: Freq: Four times a day (QID) | CUTANEOUS | 99 refills | Status: DC

## 2016-07-31 MED ORDER — GLIPIZIDE 10 MG PO TABS: 10.0000 mg | ORAL_TABLET | Freq: Three times a day (TID) | ORAL | 3 refills | Status: DC

## 2016-07-31 MED ORDER — LOSARTAN POTASSIUM 25 MG PO TABS: 25.0000 mg | ORAL_TABLET | Freq: Every day | ORAL | 3 refills | Status: DC

## 2016-07-31 MED ORDER — MELOXICAM 15 MG PO TABS: 15.0000 mg | ORAL_TABLET | Freq: Every day | ORAL | 3 refills | Status: DC

## 2016-07-31 MED ORDER — LEVOTHYROXINE SODIUM 25 MCG PO TABS: 25.0000 ug | ORAL_TABLET | Freq: Every day | ORAL | 3 refills | Status: DC

## 2016-07-31 NOTE — Progress Notes (Signed)
Patient ID: Cathy Fuller, female    DOB: 09/23/1961, 55 y.o.   MRN: [id number]  PCP: Porfirio Oar, PA-C  Subjective:   Chief Complaint  Patient presents with  . Follow-up    3 month follow up    HPI Presents for evaluation of diabetes, HTN. She also needs prescription refills.  Both issues have been well controlled.  Declines seasonal influenza vaccine reporting that her husband gets sick every year when she get the flu vaccine. She's leaving town next week, and wants to wait to get her vaccine until she is back.  Continues to get rash in the skin folds. Has lost a lot of weight and the extra skin is problematic. Ketoconazole is helpful, but the rash recurs as soon as she stops using it. Desires tummy tuck procedure.  Has a bladder sling. Still has some incomplete emptying.  Continues to tolerate her medication well, without adverse effects.  Controlled Substance Overview  Indication for chronic opioid: spondylolisthesis of lumbar region s/p spinal fusion Medication and dose: hydrocodone-APAP 10-325, 1-2 tablets Q4 hours, PRN pain # pills per month: 90 prescribed (fills only Q2-3 months) Last UDS date: not yet Pain contract signed (Y/N): not yet Date narcotic database last reviewed (include red flags): uncertain. (ADDENDUM 08/08/2016: [place]. Only Rx in past 6 months was written 01/17/2016 and filled 04/11/2016 for #90 tablets).   Depression screen [facility] 2/9 07/31/2016 04/17/2016 02/14/2016 01/30/2016 01/17/2016  Decreased Interest 0 0 0 0 0  Down, Depressed, Hopeless 0 0 0 0 0  PHQ - 2 Score 0 0 0 0 0  Tired, decreased energy - - - - -  Change in appetite - - - - -  Feeling bad or failure about yourself  - - - - -  Trouble concentrating - - - - -  Moving slowly or fidgety/restless - - - - -  Suicidal thoughts - - - - -     reports that she drinks about 0.6 - 1.2 oz of alcohol per week .   reports that she does not use drugs.    Review of Systems As  above. No chest pain, SOB, HA, dizziness, vision change, N/V, diarrhea, constipation, dysuria, urinary urgency or frequency, new myalgias or arthralgias.     Patient Active Problem List   Diagnosis Date Noted  . Hyperlipidemia 01/17/2016  . Spondylolisthesis of lumbar region 12/27/2015  . Essential hypertension   . S/P spinal fusion   . Obesity (BMI 30-39.9) 05/04/2014  . OSA (obstructive sleep apnea) 10/27/2013  . Hypothyroidism 01/30/2012  . Diabetes mellitus type 2, controlled, without complications (HCC) 05/29/2010  . BARRETTS ESOPHAGUS 05/29/2010  . FATTY LIVER DISEASE 05/29/2010  . COLONIC POLYPS, ADENOMATOUS, HX OF 05/29/2010     Prior to Admission medications   Medication Sig Start Date End Date Taking? Authorizing Provider  cyclobenzaprine (FLEXERIL) 10 MG tablet Take 1 tablet (10 mg total) by mouth 3 (three) times daily as needed. 10/18/15  Yes Ashly Goethe, PA-C  dapagliflozin propanediol (FARXIGA) 5 MG TABS tablet Take 5 mg by mouth daily. Reported on 04/17/2016   Yes Historical Provider, MD  famotidine (PEPCID) 20 MG tablet Take 40 mg by mouth at bedtime.    Yes Historical Provider, MD  glipiZIDE (GLUCOTROL) 10 MG tablet Take 1 tablet (10 mg total) by mouth 3 (three) times daily before meals. 07/21/16  Yes Ofilia Neas, PA-C  HYDROcodone-acetaminophen (NORCO) 10-325 MG tablet Take 1-2 tablets by mouth every 4 (four) hours as needed  for severe pain. 04/17/16  Yes Brevyn Ring, PA-C  KRILL OIL PO Take 500 mg by mouth daily.    Yes Historical Provider, MD  levothyroxine (SYNTHROID, LEVOTHROID) 25 MCG tablet Take 1 tablet (25 mcg total) by mouth daily. 07/21/16  Yes Ofilia Neas, PA-C  losartan (COZAAR) 25 MG tablet Take 1 tablet (25 mg total) by mouth daily. 07/21/16  Yes Ofilia Neas, PA-C  meloxicam (MOBIC) 15 MG tablet Take 1 tablet (15 mg total) by mouth daily. Reported on 04/17/2016 04/17/16  Yes Naomii Kreger, PA-C  milk thistle 175 MG tablet Take 175 mg by mouth  daily. Reported on 04/17/2016   Yes Historical Provider, MD  Turmeric Curcumin 500 MG CAPS Take 1,500 mg by mouth daily.   Yes Historical Provider, MD  ketoconazole (NIZORAL) 2 % cream Apply 1 application topically daily. Patient not taking: Reported on 07/31/2016 02/11/14   Porfirio Oar, PA-C     Allergies  Allergen Reactions  . Aspirin Tinitus  . Tape Other (See Comments)    Blisters       Objective:  Physical Exam  Constitutional: She is oriented to person, place, and time. She appears well-developed and well-nourished. She is active and cooperative. No distress.  BP 120/76   Pulse 67   Temp 97.8 F (36.6 C) (Oral)   Resp 18   Ht 5' 4.5" (1.638 m)   Wt 197 lb (89.4 kg)   SpO2 99%   BMI 33.29 kg/m   HENT:  Head: Normocephalic and atraumatic.  Right Ear: Hearing normal.  Left Ear: Hearing normal.  Eyes: Conjunctivae are normal. No scleral icterus.  Neck: Normal range of motion. Neck supple. No thyromegaly present.  Cardiovascular: Normal rate, regular rhythm and normal heart sounds.   Pulses:      Radial pulses are 2+ on the right side, and 2+ on the left side.  Pulmonary/Chest: Effort normal and breath sounds normal.  Lymphadenopathy:       Head (right side): No tonsillar, no preauricular, no posterior auricular and no occipital adenopathy present.       Head (left side): No tonsillar, no preauricular, no posterior auricular and no occipital adenopathy present.    She has no cervical adenopathy.       Right: No supraclavicular adenopathy present.       Left: No supraclavicular adenopathy present.  Neurological: She is alert and oriented to person, place, and time. No sensory deficit.  Skin: Skin is warm, dry and intact. Rash (consistent with cutaneous candidasis intertrigo) noted. Rash is maculopapular. There is erythema. No cyanosis. Nails show no clubbing.     Psychiatric: She has a normal mood and affect. Her speech is normal and behavior is normal.        Results for orders placed or performed in visit on 07/31/16  POCT glucose (manual entry)  Result Value Ref Range   POC Glucose 78 70 - 99 mg/dl  POCT glycosylated hemoglobin (Hb A1C)  Result Value Ref Range   Hemoglobin A1C 6.1        Assessment & Plan:   1. Controlled type 2 diabetes mellitus without complication, without long-term current use of insulin (HCC) Controlled. Stable. Continue current treatment. - POCT glucose (manual entry) - POCT glycosylated hemoglobin (Hb A1C) - Comprehensive metabolic panel - glipiZIDE (GLUCOTROL) 10 MG tablet; Take 1 tablet (10 mg total) by mouth 3 (three) times daily before meals.  Dispense: 90 tablet; Refill: 3 - dapagliflozin propanediol (FARXIGA) 5 MG TABS tablet;  Take 5 mg by mouth daily. Reported on 04/17/2016  Dispense: 90 tablet; Refill: 3  2. Essential hypertension Controlled. Stable. Continue current treatment. - CBC with Differential/Platelet - Comprehensive metabolic panel - losartan (COZAAR) 25 MG tablet; Take 1 tablet (25 mg total) by mouth daily.  Dispense: 90 tablet; Refill: 3  3. Hyperlipidemia Await labs. Adjust regimen as indicated by results. - Comprehensive metabolic panel - Lipid panel  4. Hypothyroidism, unspecified hypothyroidism type Await labs. Adjust regimen as indicated by results. - Thyroid Panel With TSH - levothyroxine (SYNTHROID, LEVOTHROID) 25 MCG tablet; Take 1 tablet (25 mcg total) by mouth daily.  Dispense: 90 tablet; Refill: 3  6. Need for influenza vaccination Recommend that she return for this at her earliest convenience.  7. Other fatigue Await labs. Adjust regimen as indicated by results. - Thyroid Panel With TSH  5. Spondylolisthesis of lumbar region 8. Low back pain, unspecified back pain laterality, with sciatica presence unspecified Controlled. Stable. Continue current treatment. - meloxicam (MOBIC) 15 MG tablet; Take 1 tablet (15 mg total) by mouth daily. Reported on 04/17/2016   Dispense: 90 tablet; Refill: 3 - HYDROcodone-acetaminophen (NORCO) 10-325 MG tablet; Take 1-2 tablets by mouth every 4 (four) hours as needed for severe pain.  Dispense: 90 tablet; Refill: 0 - cyclobenzaprine (FLEXERIL) 10 MG tablet; Take 1 tablet (10 mg total) by mouth 3 (three) times daily as needed.  Dispense: 90 tablet; Refill: 1  9. Left knee pain Controlled. Stable. Continue current treatment. - meloxicam (MOBIC) 15 MG tablet; Take 1 tablet (15 mg total) by mouth daily. Reported on 04/17/2016  Dispense: 90 tablet; Refill: 3 - HYDROcodone-acetaminophen (NORCO) 10-325 MG tablet; Take 1-2 tablets by mouth every 4 (four) hours as needed for severe pain.  Dispense: 90 tablet; Refill: 0  10. Chronic prescription opiate use - Pain Mgmt, Profile 6 Conf w/o mM, U  11. Cutaneous abscess of groin Try change from cream to powder to help keep area dry. - nystatin (MYCOSTATIN/NYSTOP) powder; Apply topically 4 (four) times daily.  Dispense: 45 g; Refill: prn   Return in about 3 months (around 10/30/2016).    Fernande Brashelle S. Melondy Blanchard, PA-C Physician Assistant-Certified [facility]

## 2016-07-31 NOTE — Patient Instructions (Addendum)
KEEP UP THE GREAT WORK!!  Pl[facility]:  [facility]  [address], [place] [id number] [phone]  Please get your flu shot (I recommend as you are leaving town)  IF you received an x-ray today, you will receive an invoice from [facility]. Please contact [facility] at [phone] with questions or concerns regarding your invoice.   IF you received labwork today, you will receive an invoice from [facility]. Please contact [facility] at [phone] with questions or concerns regarding your invoice.   Our billing staff will not be able to assist you with questions regarding bills from these companies.  You will be contacted with the lab results as soon as they are available. The fastest way to get your results is to activate your My Chart account. Instructions are located on the last page of this paperwork. If you have not heard from us regarding the results in 2 weeks, please contact this office.

## 2016-08-01 ENCOUNTER — Encounter: Payer: Self-pay | Admitting: Physician Assistant

## 2016-08-01 LAB — PAIN MGMT, PROFILE 6 CONF W/O MM, U
6 Acetylmorphine: NEGATIVE ng/mL (ref <10)
ALCOHOL METABOLITES: NEGATIVE ng/mL (ref <500)
AMPHETAMINES: NEGATIVE ng/mL (ref <500)
Barbiturates: NEGATIVE ng/mL (ref <300)
Benzodiazepines: NEGATIVE ng/mL (ref <100)
COCAINE METABOLITE: NEGATIVE ng/mL (ref <150)
Creatinine: 86.9 mg/dL (ref >=20.0)
MARIJUANA METABOLITE: NEGATIVE ng/mL (ref <20)
Methadone Metabolite: NEGATIVE ng/mL (ref <100)
OPIATES: NEGATIVE ng/mL (ref <100)
OXYCODONE: NEGATIVE ng/mL (ref <100)
Oxidant: NEGATIVE ug/mL (ref <200)
PLEASE NOTE: 0
Phencyclidine: NEGATIVE ng/mL (ref <25)
pH: 5.58 (ref 4.5–9.0)

## 2016-08-01 LAB — THYROID PANEL WITH TSH
Free Thyroxine Index: 2.1 (ref 1.4–3.8)
T3 Uptake: 26 % (ref 22–35)
T4 TOTAL: 8.1 ug/dL (ref 4.5–12.0)
TSH: 1.27 m[IU]/L

## 2016-08-03 NOTE — Telephone Encounter (Signed)
[name], I didn't see any names mentioned in your OV notes, so I will send on to you.

## 2016-08-22 ENCOUNTER — Other Ambulatory Visit: Payer: Self-pay | Admitting: Emergency Medicine

## 2016-08-22 DIAGNOSIS — E119 Type 2 diabetes mellitus without complications: Secondary | ICD-10-CM

## 2016-08-22 MED ORDER — GLIPIZIDE 10 MG PO TABS: 10.0000 mg | ORAL_TABLET | Freq: Three times a day (TID) | ORAL | 3 refills | Status: DC

## 2016-08-25 MED ORDER — GLIPIZIDE 10 MG PO TABS: 10.0000 mg | ORAL_TABLET | Freq: Three times a day (TID) | ORAL | 3 refills | Status: DC

## 2016-08-25 NOTE — Progress Notes (Unsigned)
Pharmacy sent clarification of quantity, increased to correct number

## 2016-10-16 ENCOUNTER — Ambulatory Visit (INDEPENDENT_AMBULATORY_CARE_PROVIDER_SITE_OTHER): Payer: BLUE CROSS/BLUE SHIELD | Admitting: Physician Assistant

## 2016-10-16 ENCOUNTER — Encounter: Payer: Self-pay | Admitting: Physician Assistant

## 2016-10-16 VITALS — BP 136/68 | HR 80 | Temp 97.9°F | Ht 64.5 in | Wt 207.2 lb

## 2016-10-16 DIAGNOSIS — Z79891 Long term (current) use of opiate analgesic: Secondary | ICD-10-CM | POA: Diagnosis not present

## 2016-10-16 DIAGNOSIS — I10 Essential (primary) hypertension: Secondary | ICD-10-CM

## 2016-10-16 DIAGNOSIS — E119 Type 2 diabetes mellitus without complications: Secondary | ICD-10-CM | POA: Diagnosis not present

## 2016-10-16 DIAGNOSIS — Z23 Encounter for immunization: Secondary | ICD-10-CM | POA: Diagnosis not present

## 2016-10-16 DIAGNOSIS — E785 Hyperlipidemia, unspecified: Secondary | ICD-10-CM

## 2016-10-16 DIAGNOSIS — M4316 Spondylolisthesis, lumbar region: Secondary | ICD-10-CM

## 2016-10-16 MED ORDER — TRAMADOL HCL 50 MG PO TABS: 50.0000 mg | ORAL_TABLET | Freq: Three times a day (TID) | ORAL | 0 refills | Status: DC | PRN

## 2016-10-16 NOTE — Patient Instructions (Addendum)
We'll need to get a urine microalbumin when you come next time.    IF you received an x-ray today, you will receive an invoice from [facility]. Please contact [facility] at [phone] with questions or concerns regarding your invoice.   IF you received labwork today, you will receive an invoice from [facility]. Please contact [facility] at [phone] with questions or concerns regarding your invoice.   Our billing staff will not be able to assist you with questions regarding bills from these companies.  You will be contacted with the lab results as soon as they are available. The fastest way to get your results is to activate your My Chart account. Instructions are located on the last page of this paperwork. If you have not heard from us regarding the results in 2 weeks, please contact this office.

## 2016-10-16 NOTE — Progress Notes (Signed)
Patient ID: Cathy Fuller, female    DOB: Mar 31, 1961, 55 y.o.   MRN: [id number]  PCP: Porfirio Oarhelle Lakaya Tolen, PA-C  Chief Complaint  Patient presents with  . Follow-up    3 month Diabetic f/u    Subjective:   Presents for evaluation of diabetes, and do discuss changing treatment of her chronic pain.  Both diabetes and chronic pain have been well controlled, and she tolerates treatment without difficulty. She spend 6 weeks in [place] getting her parents moved out of their home and into a local assisted living facility here, following her father's hospitalization and due to her mother's significant dementia. While there, she temporarily misplaced her bottle of hydrocodone and took several doses of her daughter's tramadol. She reports that she had excellent pain control and less "hungover" feeling after using the tramadol compared with the hydrocodone, and desires to change.   Controlled Substance Overview Low risk Indication for chronic opioid: spondylolisthesis of lumbar region s/p spinal fusion Medication and dose (current): hydrocodone-APAP 10-325, 1-2 tablets Q4 hours, PRN pain # pills per month: 90 prescribed (fills only Q2-3 months) Last UDS date: 07/31/2016 Pain contract signed (Y/N): 07/31/2016 Date narcotic database last reviewed (include red flags): 08/08/2016  [facility]/16/2016 07/31/2016 04/17/2016 02/14/2016 01/30/2016  Decreased Interest 0 0 0 0 0  Down, Depressed, Hopeless 0 0 0 0 0  PHQ - 2 Score 0 0 0 0 0  Tired, decreased energy - - - - -  Change in appetite - - - - -  Feeling bad or failure about yourself  - - - - -  Trouble concentrating - - - - -  Moving slowly or fidgety/restless - - - - -  Suicidal thoughts - - - - -     reports that she drinks about 0.6 - 1.2 oz of alcohol per week .   reports that she does not use drugs.         Review of Systems  Constitutional: Negative for activity change, appetite change, fatigue and unexpected weight  change.  HENT: Negative for congestion, dental problem, ear pain, hearing loss, mouth sores, postnasal drip, rhinorrhea, sneezing, sore throat, tinnitus and trouble swallowing.   Eyes: Negative for photophobia, pain, redness and visual disturbance.  Respiratory: Negative for cough, chest tightness and shortness of breath.   Cardiovascular: Negative for chest pain, palpitations and leg swelling.  Gastrointestinal: Positive for abdominal pain (intermittent LUQ pain). Negative for blood in stool, constipation, diarrhea, nausea and vomiting.  Genitourinary: Negative for dysuria, frequency, hematuria and urgency.  Musculoskeletal: Negative for arthralgias, gait problem, myalgias and neck stiffness.  Skin: Negative for rash.  Neurological: Negative for dizziness, speech difficulty, weakness, light-headedness, numbness and headaches.  Hematological: Negative for adenopathy.  Psychiatric/Behavioral: Negative for confusion and sleep disturbance. The patient is not nervous/anxious.        Patient Active Problem List   Diagnosis Date Noted  . Chronic prescription opiate use 07/31/2016  . Hyperlipidemia 01/17/2016  . Spondylolisthesis of lumbar region 12/27/2015  . Essential hypertension   . S/P spinal fusion   . Obesity (BMI 30-39.9) 05/04/2014  . OSA (obstructive sleep apnea) 10/27/2013  . Hypothyroidism 01/30/2012  . Diabetes mellitus type 2, controlled, without complications (HCC) 05/29/2010  . BARRETTS ESOPHAGUS 05/29/2010  . FATTY LIVER DISEASE 05/29/2010  . COLONIC POLYPS, ADENOMATOUS, HX OF 05/29/2010     Prior to Admission medications   Medication Sig Start Date End Date Taking? Authorizing Provider  cyclobenzaprine (FLEXERIL) 10 MG tablet  Take 1 tablet (10 mg total) by mouth 3 (three) times daily as needed. 07/31/16  Yes Kinda Pottle, PA-C  dapagliflozin propanediol (FARXIGA) 5 MG TABS tablet Take 5 mg by mouth daily. Reported on 04/17/2016 07/31/16  Yes Bijon Mineer, PA-C    famotidine (PEPCID) 20 MG tablet Take 40 mg by mouth at bedtime.    Yes Historical Provider, MD  glipiZIDE (GLUCOTROL) 10 MG tablet Take 1 tablet (10 mg total) by mouth 3 (three) times daily before meals. 08/25/16  Yes Zubair Lofton, PA-C  HYDROcodone-acetaminophen (NORCO) 10-325 MG tablet Take 1-2 tablets by mouth every 4 (four) hours as needed for severe pain. 07/31/16  Yes Kemyra August, PA-C  KRILL OIL PO Take 500 mg by mouth daily.    Yes Historical Provider, MD  levothyroxine (SYNTHROID, LEVOTHROID) 25 MCG tablet Take 1 tablet (25 mcg total) by mouth daily. 07/31/16  Yes Colleen Donahoe, PA-C  losartan (COZAAR) 25 MG tablet Take 1 tablet (25 mg total) by mouth daily. 07/31/16  Yes Loudon Krakow, PA-C  meloxicam (MOBIC) 15 MG tablet Take 1 tablet (15 mg total) by mouth daily. Reported on 04/17/2016 07/31/16  Yes Loc Feinstein, PA-C  milk thistle 175 MG tablet Take 175 mg by mouth daily. Reported on 04/17/2016   Yes Historical Provider, MD  nystatin (MYCOSTATIN/NYSTOP) powder Apply topically 4 (four) times daily. 07/31/16  Yes Tory Septer, PA-C  Turmeric Curcumin 500 MG CAPS Take 1,500 mg by mouth daily.   Yes Historical Provider, MD     Allergies  Allergen Reactions  . Aspirin Tinitus  . Tape Other (See Comments)    Blisters       Objective:  Physical Exam  Constitutional: She is oriented to person, place, and time. She appears well-developed and well-nourished. She is active and cooperative. No distress.  BP 136/68 (BP Location: Right Arm, Patient Position: Sitting, Cuff Size: Small)   Pulse 80   Temp 97.9 F (36.6 C) (Oral)   Ht 5' 4.5" (1.638 m)   Wt 207 lb 3.2 oz (94 kg)   SpO2 98%   BMI 35.02 kg/m   HENT:  Head: Normocephalic and atraumatic.  Right Ear: Hearing normal.  Left Ear: Hearing normal.  Eyes: Conjunctivae are normal. No scleral icterus.  Neck: Normal range of motion. Neck supple. No thyromegaly present.  Cardiovascular: Normal rate, regular rhythm and normal  heart sounds.   Pulses:      Radial pulses are 2+ on the right side, and 2+ on the left side.  Pulmonary/Chest: Effort normal and breath sounds normal.  Lymphadenopathy:       Head (right side): No tonsillar, no preauricular, no posterior auricular and no occipital adenopathy present.       Head (left side): No tonsillar, no preauricular, no posterior auricular and no occipital adenopathy present.    She has no cervical adenopathy.       Right: No supraclavicular adenopathy present.       Left: No supraclavicular adenopathy present.  Neurological: She is alert and oriented to person, place, and time. No sensory deficit.  Skin: Skin is warm, dry and intact. No rash noted. No cyanosis or erythema. Nails show no clubbing.  Psychiatric: She has a normal mood and affect. Her speech is normal and behavior is normal.           Assessment & Plan:   1. Controlled type 2 diabetes mellitus without complication, without long-term current use of insulin (HCC) Await labs. Adjust regimen as indicated by  results. - Comprehensive metabolic panel - Hemoglobin A1c  2. Essential hypertension Controlled. Stable. Continue current treatment. - CBC with Differential/Platelet - Comprehensive metabolic panel  3. Hyperlipidemia, unspecified hyperlipidemia type Await labs. Adjust regimen as indicated by results. - Comprehensive metabolic panel - Lipid panel  4. Chronic prescription opiate use 5. Spondylolisthesis of lumbar region Stop hydrocodone. Start tramadol. - traMADol (ULTRAM) 50 MG tablet; Take 1 tablet (50 mg total) by mouth every 8 (eight) hours as needed.  Dispense: 30 tablet; Refill: 0  6. Need for prophylactic vaccination and inoculation against influenza - Flu Vaccine QUAD 36+ mos IM   Fernande Bras, PA-C Physician Assistant-Certified [facility]

## 2016-10-17 LAB — CBC WITH DIFFERENTIAL/PLATELET
BASOS ABS: 0 10*3/uL (ref 0.0–0.2)
Basos: 0 %
EOS (ABSOLUTE): 0.2 10*3/uL (ref 0.0–0.4)
EOS: 3 %
HEMATOCRIT: 39.6 % (ref 34.0–46.6)
Hemoglobin: 12.9 g/dL (ref 11.1–15.9)
IMMATURE GRANULOCYTES: 0 %
Immature Grans (Abs): 0 10*3/uL (ref 0.0–0.1)
Lymphocytes Absolute: 3 10*3/uL (ref 0.7–3.1)
Lymphs: 39 %
MCH: 29.7 pg (ref 26.6–33.0)
MCHC: 32.6 g/dL (ref 31.5–35.7)
MCV: 91 fL (ref 79–97)
MONOCYTES: 4 %
MONOS ABS: 0.3 10*3/uL (ref 0.1–0.9)
NEUTROS PCT: 54 %
Neutrophils Absolute: 4.2 10*3/uL (ref 1.4–7.0)
Platelets: 210 10*3/uL (ref 150–379)
RBC: 4.35 x10E6/uL (ref 3.77–5.28)
RDW: 14.3 % (ref 12.3–15.4)
WBC: 7.7 10*3/uL (ref 3.4–10.8)

## 2016-10-17 LAB — HEMOGLOBIN A1C
Est. average glucose Bld gHb Est-mCnc: 160 mg/dL
Hgb A1c MFr Bld: 7.2 % — ABNORMAL HIGH (ref 4.8–5.6)

## 2016-10-17 LAB — COMPREHENSIVE METABOLIC PANEL
A/G RATIO: 1.6 (ref 1.2–2.2)
ALT: 13 IU/L (ref 0–32)
AST: 14 IU/L (ref 0–40)
Albumin: 4.6 g/dL (ref 3.5–5.5)
Alkaline Phosphatase: 98 IU/L (ref 39–117)
BUN/Creatinine Ratio: 20 (ref 9–23)
BUN: 19 mg/dL (ref 6–24)
Bilirubin Total: 0.3 mg/dL (ref 0.0–1.2)
CALCIUM: 9.4 mg/dL (ref 8.7–10.2)
CO2: 22 mmol/L (ref 18–29)
Chloride: 105 mmol/L (ref 96–106)
Creatinine, Ser: 0.93 mg/dL (ref 0.57–1.00)
GFR, EST AFRICAN AMERICAN: 79 mL/min/{1.73_m2} (ref >59)
GFR, EST NON AFRICAN AMERICAN: 68 mL/min/{1.73_m2} (ref >59)
GLOBULIN, TOTAL: 2.8 g/dL (ref 1.5–4.5)
Glucose: 99 mg/dL (ref 65–99)
POTASSIUM: 4.3 mmol/L (ref 3.5–5.2)
SODIUM: 144 mmol/L (ref 134–144)
TOTAL PROTEIN: 7.4 g/dL (ref 6.0–8.5)

## 2016-10-17 LAB — LIPID PANEL
CHOL/HDL RATIO: 4.6 ratio — AB (ref 0.0–4.4)
Cholesterol, Total: 234 mg/dL — ABNORMAL HIGH (ref 100–199)
HDL: 51 mg/dL (ref >39)
LDL CALC: 138 mg/dL — AB (ref 0–99)
Triglycerides: 224 mg/dL — ABNORMAL HIGH (ref 0–149)
VLDL CHOLESTEROL CAL: 45 mg/dL — AB (ref 5–40)

## 2017-01-15 ENCOUNTER — Ambulatory Visit: Payer: Medicare Other | Admitting: Physician Assistant

## 2017-01-21 ENCOUNTER — Encounter: Payer: Self-pay | Admitting: Physician Assistant

## 2017-01-29 ENCOUNTER — Ambulatory Visit (INDEPENDENT_AMBULATORY_CARE_PROVIDER_SITE_OTHER): Payer: BLUE CROSS/BLUE SHIELD

## 2017-01-29 ENCOUNTER — Ambulatory Visit (INDEPENDENT_AMBULATORY_CARE_PROVIDER_SITE_OTHER): Payer: BLUE CROSS/BLUE SHIELD | Admitting: Physician Assistant

## 2017-01-29 ENCOUNTER — Encounter: Payer: Self-pay | Admitting: Physician Assistant

## 2017-01-29 VITALS — BP 106/66 | HR 69 | Temp 99.0°F | Resp 16 | Ht 64.5 in | Wt 214.2 lb

## 2017-01-29 DIAGNOSIS — E785 Hyperlipidemia, unspecified: Secondary | ICD-10-CM | POA: Diagnosis not present

## 2017-01-29 DIAGNOSIS — I10 Essential (primary) hypertension: Secondary | ICD-10-CM

## 2017-01-29 DIAGNOSIS — E119 Type 2 diabetes mellitus without complications: Secondary | ICD-10-CM | POA: Diagnosis not present

## 2017-01-29 DIAGNOSIS — E039 Hypothyroidism, unspecified: Secondary | ICD-10-CM

## 2017-01-29 DIAGNOSIS — S9032XA Contusion of left foot, initial encounter: Secondary | ICD-10-CM

## 2017-01-29 NOTE — Progress Notes (Signed)
Patient ID: Cathy Fuller, female    DOB: 1961/06/17, 56 y.o.   MRN: [id number]  PCP: Porfirio Oar, PA-C  Chief Complaint  Patient presents with  . Follow-up    DIABETES  . Foot Pain    LEFT LARGE CANDLE FELL ON FOOT x 2 weeks, weight loss, fatigue, and biometric screening         Subjective:   Presents for evaluation of diabetes.  Forgot the form she needs completed. She'll bring it in once the lab results are in.  Has been diagnosed with "dry" macular degeneration. Saw ophthalmology 3 weeks ago.  Feels very tired. Has gained weight back. Had gotten down to 195 lbs. While out of state, dealing with her parent's property in [place], and staying with her daughter, she found that her habits changed: more pasta/starch, eating later. Also finds herself falling asleep in the evenings.  "Apron" of skin continues to be problematic. Now has two folds. Nystatin is less effective at managing the recurrent candidiasis. Has been researching the insurance criteria for having surgery. Lifting up the skin allows her to empty her bladder more easily.  She has read about an amyloid beta blood test that can be done to screen for Alzheimer's disease. As both her parents have been diagnosed with Alzheimer's dementia, she is interested in pursuing early intervention if it could delay onset/progression of symptoms.  Last month she injured the LEFT foot. 12/29/2017. Dropped a glass candle jar on the foot.  Seen at another facility on 01/05/2017. Radiographs did not reveal a fracture. Continues to have pain with palpation and weight bearing. The boot (short CAM walker) she was given hits her shin and causes pain, so she isn't wearing it anymore.   Review of Systems     Patient Active Problem List   Diagnosis Date Noted  . Chronic prescription opiate use 07/31/2016  . Hyperlipidemia 01/17/2016  . Spondylolisthesis of lumbar region 12/27/2015  . Essential hypertension   . S/P spinal  fusion   . Obesity (BMI 30-39.9) 05/04/2014  . OSA (obstructive sleep apnea) 10/27/2013  . Hypothyroidism 01/30/2012  . Diabetes mellitus type 2, controlled, without complications (HCC) 05/29/2010  . BARRETTS ESOPHAGUS 05/29/2010  . FATTY LIVER DISEASE 05/29/2010  . COLONIC POLYPS, ADENOMATOUS, HX OF 05/29/2010     Prior to Admission medications   Medication Sig Start Date End Date Taking? Authorizing Provider  cyclobenzaprine (FLEXERIL) 10 MG tablet Take 1 tablet (10 mg total) by mouth 3 (three) times daily as needed. 07/31/16  Yes Rayburn Mundis, PA-C  dapagliflozin propanediol (FARXIGA) 5 MG TABS tablet Take 5 mg by mouth daily. Reported on 04/17/2016 07/31/16  Yes Jemari Hallum, PA-C  famotidine (PEPCID) 20 MG tablet Take 40 mg by mouth at bedtime.    Yes Historical Provider, MD  glipiZIDE (GLUCOTROL) 10 MG tablet Take 1 tablet (10 mg total) by mouth 3 (three) times daily before meals. 08/25/16  Yes Tamaria Dunleavy, PA-C  KRILL OIL PO Take 500 mg by mouth daily.    Yes Historical Provider, MD  levothyroxine (SYNTHROID, LEVOTHROID) 25 MCG tablet Take 1 tablet (25 mcg total) by mouth daily. 07/31/16  Yes Ahmoni Edge, PA-C  losartan (COZAAR) 25 MG tablet Take 1 tablet (25 mg total) by mouth daily. 07/31/16  Yes Glen Blatchley, PA-C  meloxicam (MOBIC) 15 MG tablet Take 1 tablet (15 mg total) by mouth daily. Reported on 04/17/2016 07/31/16  Yes Keshonda Monsour, PA-C  milk thistle 175 MG tablet Take 175 mg  by mouth daily. Reported on 04/17/2016   Yes Historical Provider, MD  nystatin (MYCOSTATIN/NYSTOP) powder Apply topically 4 (four) times daily. 07/31/16  Yes Bruce Churilla, PA-C  traMADol (ULTRAM) 50 MG tablet Take 1 tablet (50 mg total) by mouth every 8 (eight) hours as needed. 10/16/16  Yes Atiyah Bauer, PA-C  Turmeric Curcumin 500 MG CAPS Take 1,500 mg by mouth daily.   Yes Historical Provider, MD  traMADol (ULTRAM) 50 MG tablet Take 1 tablet (50 mg total) by mouth every 8 (eight) hours as  needed. 10/16/16   Kayhan Boardley, PA-C  traMADol (ULTRAM) 50 MG tablet Take 1 tablet (50 mg total) by mouth every 8 (eight) hours as needed. 10/16/16   Porfirio Oarhelle Claryce Friel, PA-C     Allergies  Allergen Reactions  . Aspirin Tinitus  . Tape Other (See Comments)    Blisters       Objective:  Physical Exam  Constitutional: She is oriented to person, place, and time. She appears well-developed and well-nourished. She is active and cooperative. No distress.  BP 106/66 (BP Location: Right Arm, Patient Position: Sitting, Cuff Size: Large)   Pulse 69   Temp 99 F (37.2 C) (Oral)   Resp 16   Ht 5' 4.5" (1.638 m)   Wt 214 lb 3.2 oz (97.2 kg)   SpO2 96%   BMI 36.20 kg/m   HENT:  Head: Normocephalic and atraumatic.  Right Ear: Hearing normal.  Left Ear: Hearing normal.  Eyes: Conjunctivae are normal. No scleral icterus.  Neck: Normal range of motion. Neck supple. No thyromegaly present.  Cardiovascular: Normal rate, regular rhythm and normal heart sounds.   Pulses:      Radial pulses are 2+ on the right side, and 2+ on the left side.  Pulmonary/Chest: Effort normal and breath sounds normal.  Lymphadenopathy:       Head (right side): No tonsillar, no preauricular, no posterior auricular and no occipital adenopathy present.       Head (left side): No tonsillar, no preauricular, no posterior auricular and no occipital adenopathy present.    She has no cervical adenopathy.       Right: No supraclavicular adenopathy present.       Left: No supraclavicular adenopathy present.  Neurological: She is alert and oriented to person, place, and time. No sensory deficit.  Skin: Skin is warm, dry and intact. No rash noted. No cyanosis or erythema. Nails show no clubbing.  Psychiatric: She has a normal mood and affect. Her speech is normal and behavior is normal.    Wt Readings from Last 3 Encounters:  01/29/17 214 lb 3.2 oz (97.2 kg)  10/16/16 207 lb 3.2 oz (94 kg)  07/31/16 197 lb (89.4 kg)         Assessment & Plan:   1. Controlled type 2 diabetes mellitus without complication, without long-term current use of insulin (HCC) Await labs. Adjust regimen as indicated by results. Anticipate increased A1C given recent eeating changes and weight gain. - Comprehensive metabolic panel - Hemoglobin A1c - Microalbumin, urine  2. Essential hypertension Controlled.  - CBC with Differential/Platelet - Comprehensive metabolic panel  3. Hypothyroidism, unspecified type Await labs. Adjust regimen as indicated by results. - TSH - T4, free  4. Hyperlipidemia, unspecified hyperlipidemia type Await labs. Adjust regimen as indicated by results. - Comprehensive metabolic panel - Lipid panel  5. Contusion of left foot, initial encounter Update radiographs. Resume boot, but tall version for comfort. - DG Toe 4th Left; Future - DG  Foot Complete Left; Future - Care order/instruction:   Return in about 3 months (around 05/01/2017) for re-evaluation of diabetes, blood pressure, cholesterol.   Fernande Bras, PA-C Physician Assistant-Certified [facility] at [facility]

## 2017-01-29 NOTE — Patient Instructions (Addendum)
I'll investigate the amyloid beta testing for Alzheimer's.    IF you received an x-ray today, you will receive an invoice from [facility]. Please contact [facility] at [phone] with questions or concerns regarding your invoice.   IF you received labwork today, you will receive an invoice from [facility]. Please contact [facility] at [phone] with questions or concerns regarding your invoice.   Our billing staff will not be able to assist you with questions regarding bills from these companies.  You will be contacted with the lab results as soon as they are available. The fastest way to get your results is to activate your My Chart account. Instructions are located on the last page of this paperwork. If you have not heard from us regarding the results in 2 weeks, please contact this office.

## 2017-01-30 LAB — T4, FREE: Free T4: 1.25 ng/dL (ref 0.82–1.77)

## 2017-01-30 LAB — LIPID PANEL
Chol/HDL Ratio: 5.2 ratio units — ABNORMAL HIGH (ref 0.0–4.4)
Cholesterol, Total: 238 mg/dL — ABNORMAL HIGH (ref 100–199)
HDL: 46 mg/dL (ref >39)
LDL Calculated: 142 mg/dL — ABNORMAL HIGH (ref 0–99)
Triglycerides: 251 mg/dL — ABNORMAL HIGH (ref 0–149)
VLDL Cholesterol Cal: 50 mg/dL — ABNORMAL HIGH (ref 5–40)

## 2017-01-30 LAB — CBC WITH DIFFERENTIAL/PLATELET
BASOS: 0 %
Basophils Absolute: 0 10*3/uL (ref 0.0–0.2)
EOS (ABSOLUTE): 0.2 10*3/uL (ref 0.0–0.4)
EOS: 2 %
HEMATOCRIT: 38.3 % (ref 34.0–46.6)
HEMOGLOBIN: 12.5 g/dL (ref 11.1–15.9)
IMMATURE GRANS (ABS): 0 10*3/uL (ref 0.0–0.1)
IMMATURE GRANULOCYTES: 0 %
LYMPHS: 34 %
Lymphocytes Absolute: 2.8 10*3/uL (ref 0.7–3.1)
MCH: 29.6 pg (ref 26.6–33.0)
MCHC: 32.6 g/dL (ref 31.5–35.7)
MCV: 91 fL (ref 79–97)
MONOCYTES: 5 %
Monocytes Absolute: 0.4 10*3/uL (ref 0.1–0.9)
NEUTROS ABS: 4.9 10*3/uL (ref 1.4–7.0)
NEUTROS PCT: 59 %
Platelets: 208 10*3/uL (ref 150–379)
RBC: 4.23 x10E6/uL (ref 3.77–5.28)
RDW: 14.7 % (ref 12.3–15.4)
WBC: 8.4 10*3/uL (ref 3.4–10.8)

## 2017-01-30 LAB — COMPREHENSIVE METABOLIC PANEL
A/G RATIO: 1.6 (ref 1.2–2.2)
ALBUMIN: 4.5 g/dL (ref 3.5–5.5)
ALK PHOS: 96 IU/L (ref 39–117)
ALT: 22 IU/L (ref 0–32)
AST: 19 IU/L (ref 0–40)
BILIRUBIN TOTAL: 0.7 mg/dL (ref 0.0–1.2)
BUN / CREAT RATIO: 19 (ref 9–23)
BUN: 21 mg/dL (ref 6–24)
CHLORIDE: 103 mmol/L (ref 96–106)
CO2: 27 mmol/L (ref 18–29)
CREATININE: 1.08 mg/dL — AB (ref 0.57–1.00)
Calcium: 9.4 mg/dL (ref 8.7–10.2)
GFR calc Af Amer: 66 mL/min/{1.73_m2} (ref >59)
GFR calc non Af Amer: 57 mL/min/{1.73_m2} — ABNORMAL LOW (ref >59)
GLOBULIN, TOTAL: 2.9 g/dL (ref 1.5–4.5)
Glucose: 134 mg/dL — ABNORMAL HIGH (ref 65–99)
POTASSIUM: 4.2 mmol/L (ref 3.5–5.2)
Sodium: 143 mmol/L (ref 134–144)
Total Protein: 7.4 g/dL (ref 6.0–8.5)

## 2017-01-30 LAB — HEMOGLOBIN A1C
Est. average glucose Bld gHb Est-mCnc: 194 mg/dL
Hgb A1c MFr Bld: 8.4 % — ABNORMAL HIGH (ref 4.8–5.6)

## 2017-01-30 LAB — MICROALBUMIN, URINE: MICROALBUM., U, RANDOM: 33.1 ug/mL

## 2017-01-30 LAB — TSH: TSH: 1.44 u[IU]/mL (ref 0.450–4.500)

## 2017-02-02 ENCOUNTER — Encounter: Payer: Self-pay | Admitting: Physician Assistant

## 2017-02-02 MED ORDER — ATORVASTATIN CALCIUM 20 MG PO TABS: 20.0000 mg | ORAL_TABLET | Freq: Every day | ORAL | 3 refills | Status: DC

## 2017-02-05 ENCOUNTER — Telehealth: Payer: Self-pay | Admitting: Emergency Medicine

## 2017-02-05 NOTE — Telephone Encounter (Signed)
Left message to return to clinic for DME signature

## 2017-02-07 IMAGING — CR DG SHOULDER 2+V*R*
3 series · 3 of 3 positions shown · non-contrast
Comparison: None.

CLINICAL DATA: Initial encounter for fell and gardening today and
anterior right shoulder.

EXAM:
RIGHT SHOULDER - 2+ VIEW

[AP (1 of 2)]
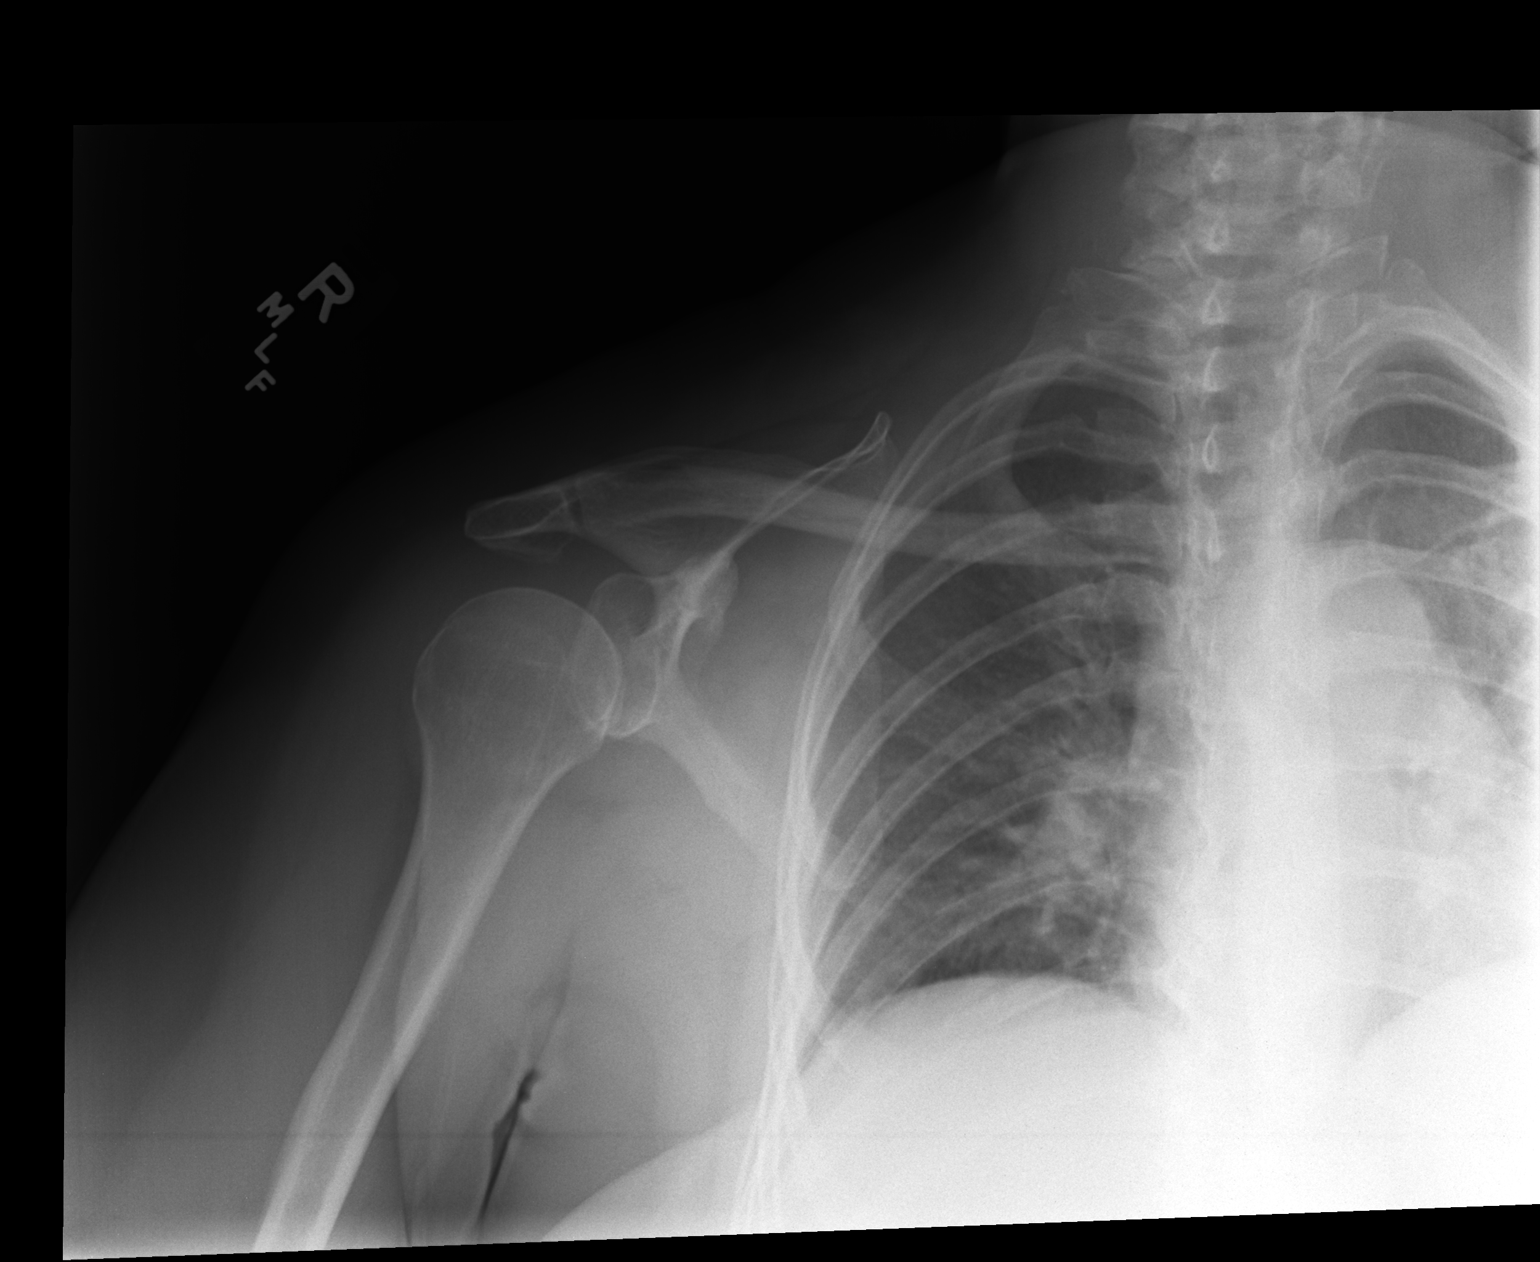

[AP (2 of 2)]
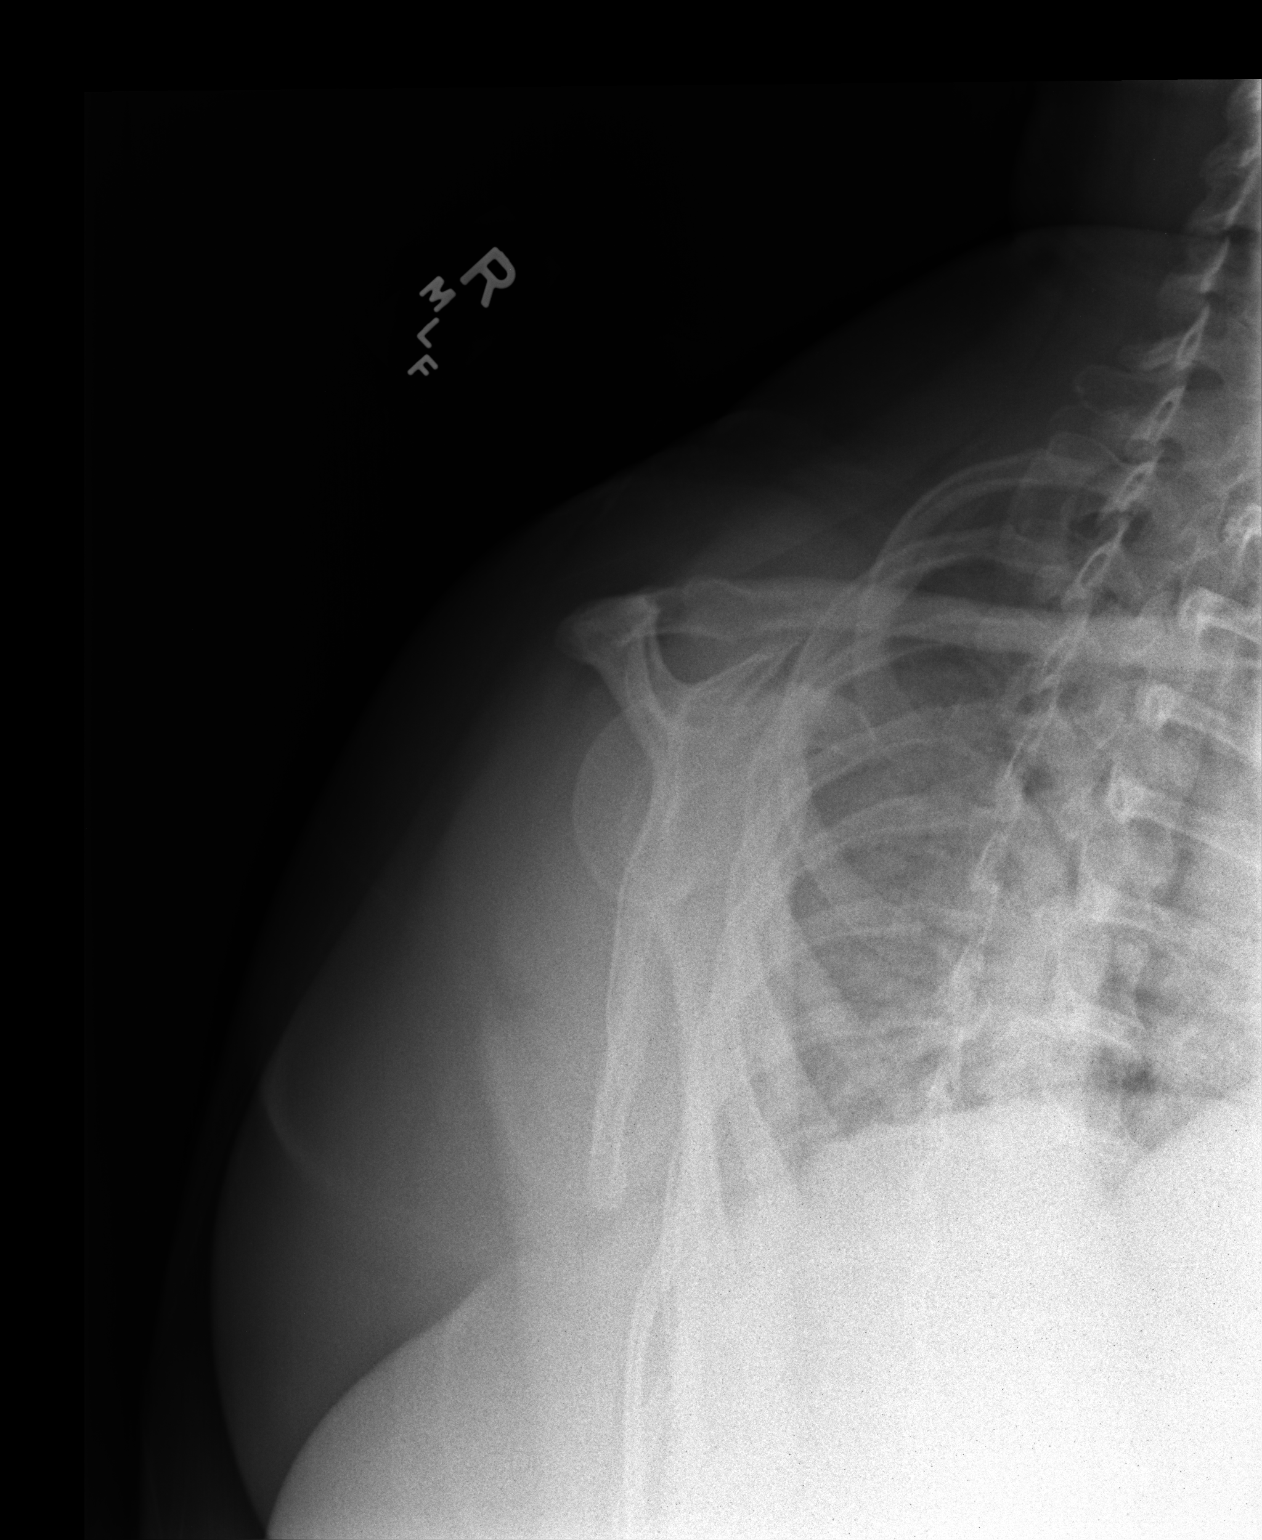

[axial]
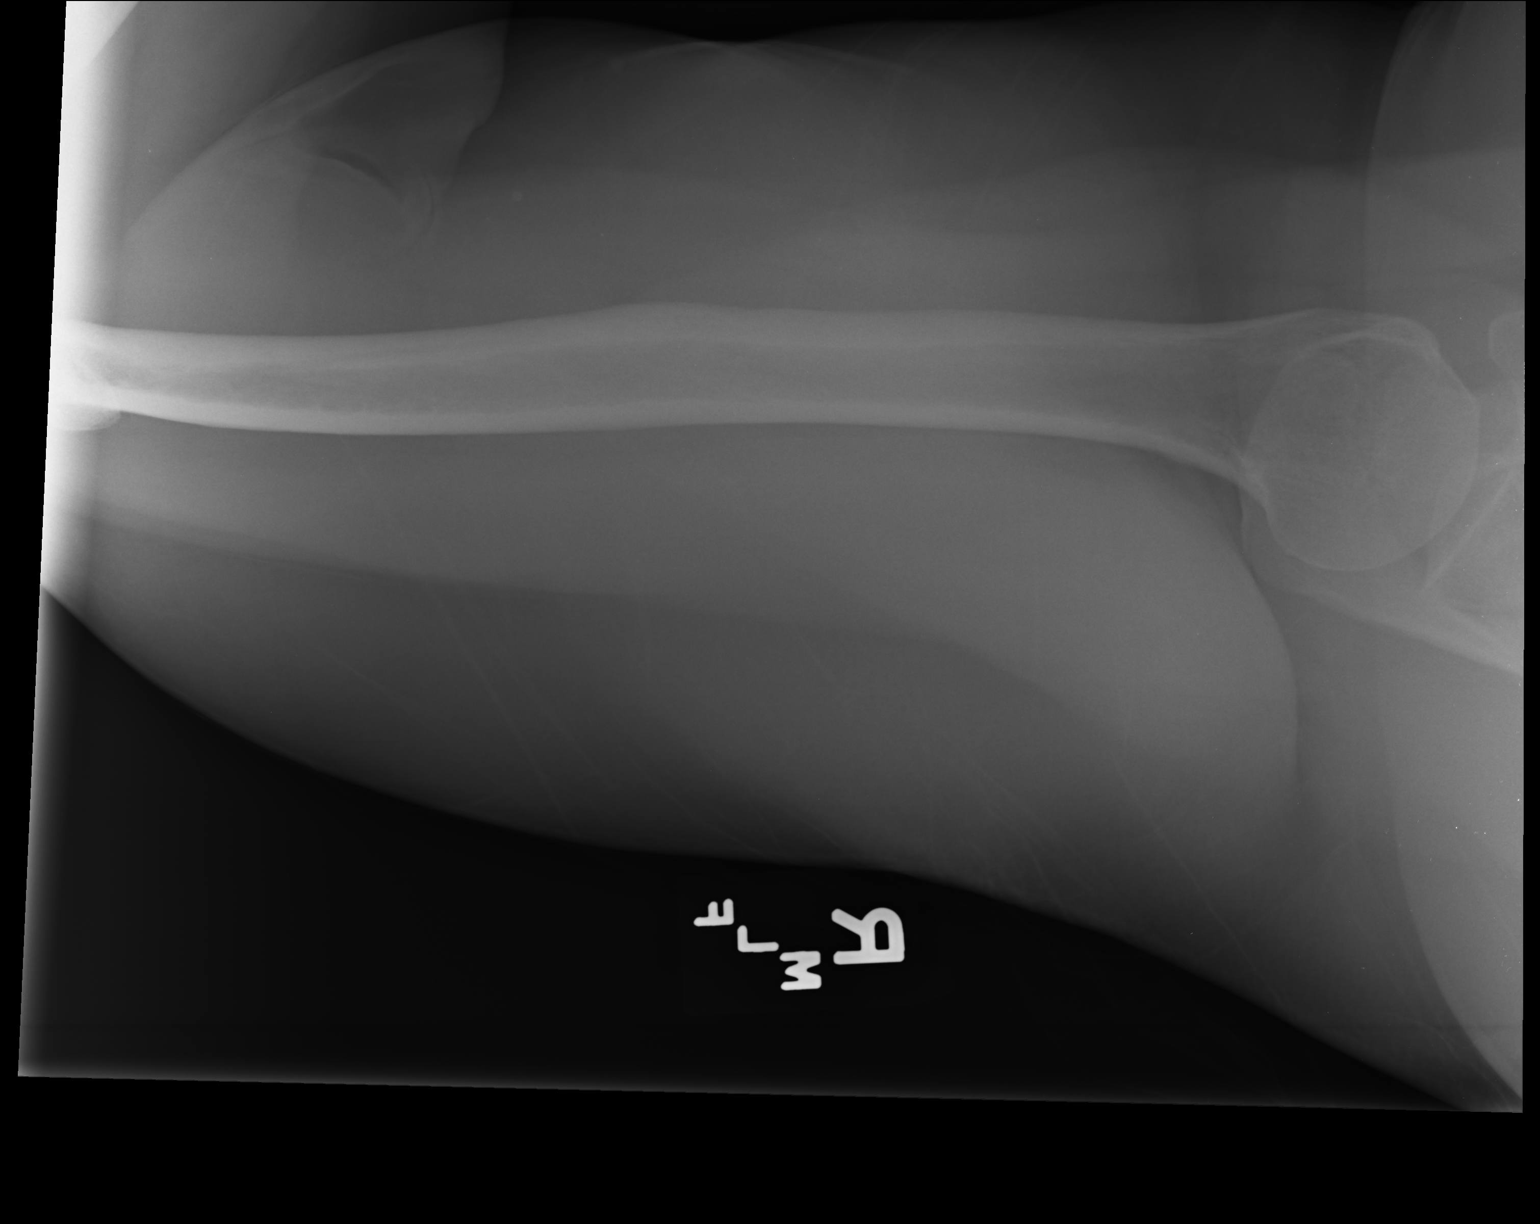

[3 of 3 positions shown; findings below may reference images not displayed]

FINDINGS: There is no evidence of fracture or dislocation. There is no
evidence of arthropathy or other focal bone abnormality. Soft
tissues are unremarkable.
IMPRESSION: Negative.

## 2017-05-07 ENCOUNTER — Ambulatory Visit (INDEPENDENT_AMBULATORY_CARE_PROVIDER_SITE_OTHER): Payer: BLUE CROSS/BLUE SHIELD | Admitting: Physician Assistant

## 2017-05-07 ENCOUNTER — Encounter: Payer: Self-pay | Admitting: Physician Assistant

## 2017-05-07 VITALS — BP 120/74 | HR 90 | Temp 97.7°F | Resp 18 | Ht 64.5 in | Wt 208.6 lb

## 2017-05-07 DIAGNOSIS — M791 Myalgia, unspecified site: Secondary | ICD-10-CM

## 2017-05-07 DIAGNOSIS — E785 Hyperlipidemia, unspecified: Secondary | ICD-10-CM | POA: Diagnosis not present

## 2017-05-07 DIAGNOSIS — I10 Essential (primary) hypertension: Secondary | ICD-10-CM

## 2017-05-07 DIAGNOSIS — E669 Obesity, unspecified: Secondary | ICD-10-CM

## 2017-05-07 DIAGNOSIS — E119 Type 2 diabetes mellitus without complications: Secondary | ICD-10-CM

## 2017-05-07 DIAGNOSIS — M4316 Spondylolisthesis, lumbar region: Secondary | ICD-10-CM

## 2017-05-07 DIAGNOSIS — B372 Candidiasis of skin and nail: Secondary | ICD-10-CM

## 2017-05-07 DIAGNOSIS — Z79891 Long term (current) use of opiate analgesic: Secondary | ICD-10-CM

## 2017-05-07 LAB — POCT URINALYSIS DIP (MANUAL ENTRY)
BILIRUBIN UA: NEGATIVE
BILIRUBIN UA: NEGATIVE mg/dL
Blood, UA: NEGATIVE
Glucose, UA: >=1000 mg/dL — AB
LEUKOCYTES UA: NEGATIVE
Nitrite, UA: NEGATIVE
Spec Grav, UA: 1.01 (ref 1.010–1.025)
Urobilinogen, UA: 0.2 E.U./dL
pH, UA: 5.5 (ref 5.0–8.0)

## 2017-05-07 MED ORDER — TRAMADOL HCL 50 MG PO TABS: 50.0000 mg | ORAL_TABLET | Freq: Three times a day (TID) | ORAL | 0 refills | Status: DC | PRN

## 2017-05-07 MED ORDER — DAPAGLIFLOZIN PROPANEDIOL 5 MG PO TABS: 5.0000 mg | ORAL_TABLET | Freq: Every day | ORAL | 3 refills | Status: DC

## 2017-05-07 NOTE — Patient Instructions (Addendum)
Plastic Surgeons: Foster SimpsonClaire Dillingham (my favorite!) [phone]  Dr. [facility] [phone]  Dr. Shon Houghruesdale  [phone]  Dr. Stephens NovemberHolderness [phone]) [phone]  Dr. Derryl HarborBower [phone]) [phone]  Dr. Ellery PlunkBest [phone]  We recommend that you schedule a mammogram for breast cancer screening. Typically, you do not need a referral to do this. Please contact a local imaging center to schedule your mammogram.  [facility] - [phone]) [phone]  *ask for the Radiology Department [facility] - [phone]) [phone] or [phone]) [phone]  [facility] - [phone]) [phone] [facility] - [phone] [facility] - [phone]) [phone]  *ask for the Radiology Department [facility] - [phone]  *ask for the Radiology Department [facility] - [phone]) [phone]  *ask for the Mammography Department [facility] - [phone]  IF you received an x-ray today, you will receive an invoice from [facility]. Please contact [facility] at [phone] with questions or concerns regarding your invoice.   IF you received labwork today, you will receive an invoice from [facility]. Please contact [facility] at [phone] with questions or concerns regarding your invoice.   Our billing staff will not be able to assist you with questions regarding bills from these companies.  You will be contacted with the lab results as soon as they are available. The fastest way to get your results is to activate your My Chart account. Instructions are located on the last page of this paperwork. If you have not heard from us regarding the results in 2 weeks, please contact this office.    Please schedule an eye exam. [facility]  [address], [place], [place] [address]  Phone: [phone]) [phone]  [facility] [address], [place], [place] [address]  Phone: [phone](336) [phone]  [facility] [address], [place], [place] [address]  Phone: [phone](336) [phone]

## 2017-05-07 NOTE — Progress Notes (Signed)
Patient ID: Cathy Fuller, female    DOB: 04/13/1961, 56 y.o.   MRN: [id number]  PCP: Porfirio Oar, PA-C  Chief Complaint  Patient presents with  . Diabetes    3 month follow up  . Follow-up    Subjective:   Presents for evaluation of diabetes.  She has stopped the [place], except when her home readings are elevated.. Home glucose 110-148 fasting. Occasional readings >200, due to late night snacking. Today >300.  Tolerates her medications, though notes some muscle aches in the legs since starting statin therapy.  Continues to have significant rash under the skin folds of the abdomen and groin. Worse with the exceptionally warm weather we have had already this summer. Nystatin powder is no longer adequately controlling the symptoms of redness and itching. Is ready to pursue surgery to remove the excess skin, but doesn't know if her insurance will cover it. She has the names of plastic surgeons I previously provided, and asks for the list again.  Stopped meloxicam, believing that it was causing pain "in my kidney" (points to the LEFT back, CVA) and had a high risk interaction with atorvastatin. The pain resolved when she stopped it, though the pain in her feet has recurred. She tries to limit the use of tramadol, generally only at [facility] for hip pain that keeps her awake. We looked at her medications and potential adverse effects, and she as reassured that using meloxicam and atorvastatin together pose no known potential risk, but since she believes the meloxicam caused the pain in the kidney area, will not recommend that she resume it.    Review of Systems  Constitutional: Positive for fatigue. Negative for activity change, appetite change, fever and unexpected weight change.  HENT: Negative for congestion, dental problem, ear pain, hearing loss, mouth sores, postnasal drip, rhinorrhea, sneezing, sore throat, tinnitus and trouble swallowing.   Eyes: Negative for photophobia, pain,  redness and visual disturbance.  Respiratory: Negative for cough, chest tightness and shortness of breath.   Cardiovascular: Negative for chest pain, palpitations and leg swelling.  Gastrointestinal: Negative for abdominal pain, blood in stool, constipation, diarrhea, nausea and vomiting.  Endocrine: Negative for cold intolerance, heat intolerance, polydipsia, polyphagia and polyuria.  Genitourinary: Negative for dysuria, frequency, hematuria and urgency.  Musculoskeletal: Positive for arthralgias and myalgias. Negative for gait problem and neck stiffness.  Skin: Positive for rash.  Neurological: Negative for dizziness, speech difficulty, weakness, light-headedness, numbness and headaches.  Hematological: Negative for adenopathy.  Psychiatric/Behavioral: Negative for confusion and sleep disturbance. The patient is not nervous/anxious.        Patient Active Problem List   Diagnosis Date Noted  . Chronic prescription opiate use 07/31/2016  . Hyperlipidemia 01/17/2016  . Spondylolisthesis of lumbar region 12/27/2015  . Essential hypertension   . S/P spinal fusion   . Obesity (BMI 30-39.9) 05/04/2014  . OSA (obstructive sleep apnea) 10/27/2013  . Hypothyroidism 01/30/2012  . Diabetes mellitus type 2, controlled, without complications (HCC) 05/29/2010  . BARRETTS ESOPHAGUS 05/29/2010  . FATTY LIVER DISEASE 05/29/2010  . COLONIC POLYPS, ADENOMATOUS, HX OF 05/29/2010     Prior to Admission medications   Medication Sig Start Date End Date Taking? Authorizing Provider  atorvastatin (LIPITOR) 20 MG tablet Take 1 tablet (20 mg total) by mouth daily. 02/02/17  Yes Lela Murfin, PA-C  cyclobenzaprine (FLEXERIL) 10 MG tablet Take 1 tablet (10 mg total) by mouth 3 (three) times daily as needed. 07/31/16  Yes Trixie Maclaren, PA-C  dapagliflozin  propanediol (FARXIGA) 5 MG TABS tablet Take 5 mg by mouth daily. Reported on 04/17/2016 07/31/16  NO Gohan Collister, PA-C  famotidine (PEPCID) 20 MG  tablet Take 40 mg by mouth at bedtime.    Yes [provider]  glipiZIDE (GLUCOTROL) 10 MG tablet Take 1 tablet (10 mg total) by mouth 3 (three) times daily before meals. 08/25/16  Yes Abril Cappiello, PA-C  KRILL OIL PO Take 500 mg by mouth daily.    Yes [provider]  levothyroxine (SYNTHROID, LEVOTHROID) 25 MCG tablet Take 1 tablet (25 mcg total) by mouth daily. 07/31/16  Yes Oluwatomisin Deman, PA-C  losartan (COZAAR) 25 MG tablet Take 1 tablet (25 mg total) by mouth daily. 07/31/16  Yes Byard Carranza, PA-C  nystatin (MYCOSTATIN/NYSTOP) powder Apply topically 4 (four) times daily. 07/31/16  Yes Santo Zahradnik, PA-C  traMADol (ULTRAM) 50 MG tablet Take 1 tablet (50 mg total) by mouth every 8 (eight) hours as needed. 10/16/16  Yes Marlia Schewe, PA-C  traMADol (ULTRAM) 50 MG tablet Take 1 tablet (50 mg total) by mouth every 8 (eight) hours as needed. 10/16/16  Yes Lynzy Rawles, PA-C  traMADol (ULTRAM) 50 MG tablet Take 1 tablet (50 mg total) by mouth every 8 (eight) hours as needed. 10/16/16  Yes Jakavion Bilodeau, PA-C  Turmeric Curcumin 500 MG CAPS Take 1,500 mg by mouth daily.    [provider]     Allergies  Allergen Reactions  . Aspirin Tinitus  . Tape Other (See Comments)    Blisters       Objective:  Physical Exam  Constitutional: She is oriented to person, place, and time. She appears well-developed and well-nourished. She is active and cooperative. No distress.  BP 120/74 (BP Location: Left Arm, Patient Position: Sitting, Cuff Size: Large)   Pulse 90   Temp 97.7 F (36.5 C) (Oral)   Resp 18   Ht 5' 4.5" (1.638 m)   Wt 208 lb 9.6 oz (94.6 kg)   SpO2 96%   BMI 35.25 kg/m   HENT:  Head: Normocephalic and atraumatic.  Right Ear: Hearing normal.  Left Ear: Hearing normal.  Eyes: Conjunctivae are normal. No scleral icterus.  Neck: Normal range of motion. Neck supple. No thyromegaly present.  Cardiovascular: Normal rate, regular rhythm and normal  heart sounds.   Pulses:      Radial pulses are 2+ on the right side, and 2+ on the left side.  Pulmonary/Chest: Effort normal and breath sounds normal.  Lymphadenopathy:       Head (right side): No tonsillar, no preauricular, no posterior auricular and no occipital adenopathy present.       Head (left side): No tonsillar, no preauricular, no posterior auricular and no occipital adenopathy present.    She has no cervical adenopathy.       Right: No supraclavicular adenopathy present.       Left: No supraclavicular adenopathy present.  Neurological: She is alert and oriented to person, place, and time. No sensory deficit.  Skin: Skin is warm, dry and intact. Rash noted. Rash is maculopapular (groin and under pannus abdomen). There is erythema. No cyanosis. Nails show no clubbing.  Psychiatric: She has a normal mood and affect. Her speech is normal and behavior is normal.     Diabetic Foot Exam - Simple   Simple Foot Form Diabetic Foot exam was performed with the following findings:  Yes 05/07/2017 10:51 AM  Visual Inspection No deformities, no ulcerations, no other skin breakdown bilaterally:  Yes Sensation Testing Intact to touch and monofilament testing bilaterally:  Yes Pulse Check Posterior Tibialis and Dorsalis pulse intact bilaterally:  Yes Comments        Assessment & Plan:   Problem List Items Addressed This Visit    Diabetes mellitus type 2, controlled, without complications (HCC) - Primary    Await labs. Suspect that the worsening cutaneous candidiasis is at least in part due to less than ideal glucose control. Will recommend resumption of daily Farxiga.      Relevant Medications   dapagliflozin propanediol (FARXIGA) 5 MG TABS tablet   Other Relevant Orders   Hemoglobin A1c (Completed)   Comprehensive metabolic panel (Completed)   Obesity (BMI 30-39.9)    Continue efforts for healthier eating choices and exercise. Refer to [facility].      Relevant  Medications   dapagliflozin propanediol (FARXIGA) 5 MG TABS tablet   Other Relevant Orders   Amb Ref to Medical Weight Management   Spondylolisthesis of lumbar region    Continue PRN tramadol.       Relevant Medications   traMADol (ULTRAM) 50 MG tablet   Essential hypertension    Well controlled. No changes.      Relevant Orders   POCT urinalysis dipstick (Completed)   Comprehensive metabolic panel (Completed)   CBC with Differential/Platelet (Completed)   Hyperlipidemia    Await labs. Adjust regimen as indicated by results.       Relevant Orders   Lipid panel (Completed)   Comprehensive metabolic panel (Completed)   Chronic prescription opiate use    Now using only tramadol.       Other Visit Diagnoses    Myalgia       Check thyroid and CK. Possibly due to statin therapy.    Relevant Orders   Comprehensive metabolic panel (Completed)   TSH (Completed)   T4, free (Completed)   CK (Completed)   Cutaneous candidiasis       Suspect poor diabetes control. Work on improving. Continue Nystatin.       Return in about 3 months (around 08/07/2017) for re-evaluation of diabetes, etc.   Fernande Bras, PA-C [facility] at [facility]

## 2017-05-07 NOTE — Progress Notes (Signed)
Subjective:    Patient ID: Cathy Fuller, female    DOB: 06/12/1961, 56 y.o.   MRN: [id number]  HPI Presents for 3 month diabetes follow-up.  Will usually take fasting readings in the morning. Readings usually range 110-148. Has had four readings >200 since last visit but knows these are related to late-night snacks. This morning's reading was >300. Continuing to purchase TrueTrack test strips off of [employer]. Reports she is not taking [place] regularly. Will occasionally take it if her readings are significantly elevated.  Has not been taking BP at home.  Reports some muscle pain in left leg in calf and thigh since starting atorvastatin.  Using Nystatin daily and reports it does not work as well now that the weather is warming up. She is interested in plastic surgery referral for removal of extra abdominal skin depending on insurance coverage for the procedure. Feels it may improve her quality of life.  Continuing one dose of Tramadol at night for hip pain to help her sleep. Feels pain is well controlled. Would like refill today.    Patient Active Problem List   Diagnosis Date Noted  . Chronic prescription opiate use 07/31/2016  . Hyperlipidemia 01/17/2016  . Spondylolisthesis of lumbar region 12/27/2015  . Essential hypertension   . S/P spinal fusion   . Obesity (BMI 30-39.9) 05/04/2014  . OSA (obstructive sleep apnea) 10/27/2013  . Hypothyroidism 01/30/2012  . Diabetes mellitus type 2, controlled, without complications (HCC) 05/29/2010  . BARRETTS ESOPHAGUS 05/29/2010  . FATTY LIVER DISEASE 05/29/2010  . COLONIC POLYPS, ADENOMATOUS, HX OF 05/29/2010   Prior to Admission medications   Medication Sig Start Date End Date Taking? Authorizing Provider  atorvastatin (LIPITOR) 20 MG tablet Take 1 tablet (20 mg total) by mouth daily. 02/02/17  Yes Jeffery, [name], PA-C  cyclobenzaprine (FLEXERIL) 10 MG tablet Take 1 tablet (10 mg total) by mouth 3 (three) times daily as needed.  07/31/16  Yes Jeffery, [name], PA-C  dapagliflozin propanediol (FARXIGA) 5 MG TABS tablet Take 5 mg by mouth daily. Reported on 04/17/2016 05/07/17  Yes Jeffery, [name], PA-C  famotidine (PEPCID) 20 MG tablet Take 40 mg by mouth at bedtime.    Yes [provider]  glipiZIDE (GLUCOTROL) 10 MG tablet Take 1 tablet (10 mg total) by mouth 3 (three) times daily before meals. 08/25/16  Yes Jeffery, [name], PA-C  KRILL OIL PO Take 500 mg by mouth daily.    Yes [provider]  levothyroxine (SYNTHROID, LEVOTHROID) 25 MCG tablet Take 1 tablet (25 mcg total) by mouth daily. 07/31/16  Yes Jeffery, [name], PA-C  losartan (COZAAR) 25 MG tablet Take 1 tablet (25 mg total) by mouth daily. 07/31/16  Yes Jeffery, [name], PA-C  nystatin (MYCOSTATIN/NYSTOP) powder Apply topically 4 (four) times daily. 07/31/16  Yes Jeffery, [name], PA-C  traMADol (ULTRAM) 50 MG tablet Take 1 tablet (50 mg total) by mouth every 8 (eight) hours as needed. 05/07/17  Yes Jeffery, [name], PA-C  traMADol (ULTRAM) 50 MG tablet Take 1 tablet (50 mg total) by mouth every 8 (eight) hours as needed. 05/07/17  Yes Jeffery, [name], PA-C  traMADol (ULTRAM) 50 MG tablet Take 1 tablet (50 mg total) by mouth every 8 (eight) hours as needed. 05/07/17  Yes Jeffery, [name], PA-C  meloxicam (MOBIC) 15 MG tablet Take 1 tablet (15 mg total) by mouth daily. Reported on 04/17/2016 Patient not taking: Reported on 05/07/2017 07/31/16   Porfirio Oar, PA-C  milk thistle 175 MG tablet Take 175 mg by mouth  daily. Reported on 04/17/2016    [provider]  Turmeric Curcumin 500 MG CAPS Take 1,500 mg by mouth daily.    [provider]     Allergies  Allergen Reactions  . Aspirin Tinitus  . Tape Other (See Comments)    Blisters     Review of Systems  Constitutional: Positive for fatigue. Negative for appetite change, chills and fever.  HENT: Negative for congestion and rhinorrhea.   Respiratory: Negative for apnea, cough and  shortness of breath.   Gastrointestinal: Positive for nausea. Negative for blood in stool, diarrhea and vomiting.  Endocrine: Negative for polydipsia, polyphagia and polyuria.  Genitourinary: Negative for hematuria.  Neurological: Negative for dizziness and headaches.       Objective:   Physical Exam  Constitutional: Vital signs are normal. She appears well-developed and well-nourished.  BP 120/74 (BP Location: Left Arm, Patient Position: Sitting, Cuff Size: Large)   Pulse 90   Temp 97.7 F (36.5 C) (Oral)   Resp 18   Ht 5' 4.5" (1.638 m)   Wt 208 lb 9.6 oz (94.6 kg)   SpO2 96%   BMI 35.25 kg/m    HENT:  Head: Normocephalic and atraumatic.  Right Ear: External ear normal.  Left Ear: External ear normal.  Eyes: EOM are normal.  Neck: Normal range of motion. Neck supple. Carotid bruit is not present.  Cardiovascular: Normal rate, regular rhythm, S1 normal, S2 normal, normal heart sounds and intact distal pulses.   Pulmonary/Chest: Effort normal and breath sounds normal.  Lymphadenopathy:       Head (right side): No submental, no submandibular, no tonsillar, no preauricular, no posterior auricular and no occipital adenopathy present.       Head (left side): No submental, no submandibular, no tonsillar, no preauricular, no posterior auricular and no occipital adenopathy present.       Right cervical: No superficial cervical, no deep cervical and no posterior cervical adenopathy present.      Left cervical: No superficial cervical, no deep cervical and no posterior cervical adenopathy present.  Neurological: She is alert.  Skin: Skin is warm and dry.  Psychiatric: She has a normal mood and affect. Her behavior is normal.       Assessment & Plan:   1. Controlled type 2 diabetes mellitus without complication, without long-term current use of insulin (HCC) Encouraged bringing in blood sugar log to office visits Refill given for Farxiga and encouraged compliance Labs pending -  Hemoglobin A1c - Comprehensive metabolic panel - dapagliflozin propanediol (FARXIGA) 5 MG TABS tablet; Take 5 mg by mouth daily. Reported on 04/17/2016 (Patient not taking: Reported on 05/07/2017)  Dispense: 90 tablet; Refill: 3  2. Essential hypertension BP WNL today Labs pending - POCT urinalysis dipstick - Comprehensive metabolic panel - CBC with Differential/Platelet  3. Hyperlipidemia, unspecified hyperlipidemia type Continue atorvastatin as prescribed. Labs pending. - Lipid panel - Comprehensive metabolic panel  4. Myalgia CMP, TSH, T4 free, and CK pending. Continue atorvastatin as prescribed. - Comprehensive metabolic panel - TSH - T4, free - CK  5. Obesity (BMI 30-39.9) Discussed continued importance of weight management for optimal control of HTN, DM2, and hyperlipidemia. Pt is willing to try weight management. Referral given. - Amb Ref to Medical Weight Management  6. Cutaneous candidiasis Continue Nystatin to decrease irritation. List of plastic surgeons and their contact information given. Advised patient to call for referral once she has made a decision on where she would like to have her surgery done.  7. Spondylolisthesis of lumbar region No change in frequency of Tramadol use and pain is well controlled. Refill given. Follow up in 3 months. - traMADol (ULTRAM) 50 MG tablet; Take 1 tablet (50 mg total) by mouth every 8 (eight) hours as needed.  Dispense: 30 tablet; Refill: 0

## 2017-05-08 LAB — HEMOGLOBIN A1C
Est. average glucose Bld gHb Est-mCnc: 255 mg/dL
Hgb A1c MFr Bld: 10.5 % — ABNORMAL HIGH (ref 4.8–5.6)

## 2017-05-08 LAB — COMPREHENSIVE METABOLIC PANEL
ALK PHOS: 130 IU/L — AB (ref 39–117)
ALT: 17 IU/L (ref 0–32)
AST: 13 IU/L (ref 0–40)
Albumin/Globulin Ratio: 1.5 (ref 1.2–2.2)
Albumin: 4.4 g/dL (ref 3.5–5.5)
BILIRUBIN TOTAL: 0.4 mg/dL (ref 0.0–1.2)
BUN/Creatinine Ratio: 19 (ref 9–23)
BUN: 20 mg/dL (ref 6–24)
CHLORIDE: 101 mmol/L (ref 96–106)
CO2: 21 mmol/L (ref 20–29)
CREATININE: 1.06 mg/dL — AB (ref 0.57–1.00)
Calcium: 10 mg/dL (ref 8.7–10.2)
GFR calc Af Amer: 67 mL/min/{1.73_m2} (ref >59)
GFR calc non Af Amer: 58 mL/min/{1.73_m2} — ABNORMAL LOW (ref >59)
GLUCOSE: 309 mg/dL — AB (ref 65–99)
Globulin, Total: 2.9 g/dL (ref 1.5–4.5)
Potassium: 4.8 mmol/L (ref 3.5–5.2)
SODIUM: 140 mmol/L (ref 134–144)
Total Protein: 7.3 g/dL (ref 6.0–8.5)

## 2017-05-08 LAB — CBC WITH DIFFERENTIAL/PLATELET
BASOS ABS: 0 10*3/uL (ref 0.0–0.2)
Basos: 0 %
EOS (ABSOLUTE): 0.3 10*3/uL (ref 0.0–0.4)
EOS: 3 %
HEMATOCRIT: 41.2 % (ref 34.0–46.6)
Hemoglobin: 13.3 g/dL (ref 11.1–15.9)
IMMATURE GRANULOCYTES: 0 %
Immature Grans (Abs): 0 10*3/uL (ref 0.0–0.1)
LYMPHS ABS: 2.4 10*3/uL (ref 0.7–3.1)
Lymphs: 27 %
MCH: 29.2 pg (ref 26.6–33.0)
MCHC: 32.3 g/dL (ref 31.5–35.7)
MCV: 91 fL (ref 79–97)
MONOS ABS: 0.4 10*3/uL (ref 0.1–0.9)
Monocytes: 4 %
NEUTROS PCT: 66 %
Neutrophils Absolute: 5.9 10*3/uL (ref 1.4–7.0)
PLATELETS: 199 10*3/uL (ref 150–379)
RBC: 4.55 x10E6/uL (ref 3.77–5.28)
RDW: 13.7 % (ref 12.3–15.4)
WBC: 8.9 10*3/uL (ref 3.4–10.8)

## 2017-05-08 LAB — T4, FREE: FREE T4: 1.95 ng/dL — AB (ref 0.82–1.77)

## 2017-05-08 LAB — LIPID PANEL
CHOL/HDL RATIO: 2.6 ratio (ref 0.0–4.4)
Cholesterol, Total: 129 mg/dL (ref 100–199)
HDL: 49 mg/dL (ref >39)
LDL Calculated: 47 mg/dL (ref 0–99)
TRIGLYCERIDES: 163 mg/dL — AB (ref 0–149)
VLDL Cholesterol Cal: 33 mg/dL (ref 5–40)

## 2017-05-08 LAB — CK: Total CK: 53 U/L (ref 24–173)

## 2017-05-08 LAB — TSH

## 2017-05-08 NOTE — Assessment & Plan Note (Addendum)
Await labs. Suspect that the worsening cutaneous candidiasis is at least in part due to less than ideal glucose control. Will recommend resumption of daily Farxiga.

## 2017-05-08 NOTE — Assessment & Plan Note (Signed)
Well controlled. No changes. 

## 2017-05-08 NOTE — Assessment & Plan Note (Signed)
Await labs. Adjust regimen as indicated by results.  

## 2017-05-08 NOTE — Assessment & Plan Note (Signed)
Continue PRN tramadol. 

## 2017-05-08 NOTE — Assessment & Plan Note (Signed)
Now using only tramadol.

## 2017-05-08 NOTE — Assessment & Plan Note (Signed)
Continue efforts for healthier eating choices and exercise. Refer to [facility].

## 2017-07-03 ENCOUNTER — Other Ambulatory Visit: Payer: Self-pay | Admitting: Physician Assistant

## 2017-07-03 DIAGNOSIS — M545 Low back pain, unspecified: Secondary | ICD-10-CM

## 2017-07-03 DIAGNOSIS — M25562 Pain in left knee: Secondary | ICD-10-CM

## 2017-07-16 ENCOUNTER — Other Ambulatory Visit: Payer: Self-pay | Admitting: Physician Assistant

## 2017-07-16 DIAGNOSIS — E119 Type 2 diabetes mellitus without complications: Secondary | ICD-10-CM

## 2017-07-19 ENCOUNTER — Encounter: Payer: Self-pay | Admitting: Physician Assistant

## 2017-07-19 ENCOUNTER — Other Ambulatory Visit: Payer: Self-pay | Admitting: Physician Assistant

## 2017-07-19 DIAGNOSIS — M545 Low back pain, unspecified: Secondary | ICD-10-CM

## 2017-07-19 DIAGNOSIS — M25562 Pain in left knee: Secondary | ICD-10-CM

## 2017-07-19 DIAGNOSIS — G8929 Other chronic pain: Secondary | ICD-10-CM

## 2017-07-19 DIAGNOSIS — M4316 Spondylolisthesis, lumbar region: Secondary | ICD-10-CM

## 2017-07-22 ENCOUNTER — Other Ambulatory Visit: Payer: Self-pay | Admitting: *Deleted

## 2017-07-22 DIAGNOSIS — E039 Hypothyroidism, unspecified: Secondary | ICD-10-CM

## 2017-07-22 DIAGNOSIS — I10 Essential (primary) hypertension: Secondary | ICD-10-CM

## 2017-07-22 MED ORDER — MELOXICAM 15 MG PO TABS: 15.0000 mg | ORAL_TABLET | Freq: Every day | ORAL | 0 refills | Status: DC

## 2017-07-22 MED ORDER — TRAMADOL HCL 50 MG PO TABS: 50.0000 mg | ORAL_TABLET | Freq: Three times a day (TID) | ORAL | 0 refills | Status: DC | PRN

## 2017-07-22 MED ORDER — LOSARTAN POTASSIUM 25 MG PO TABS: 25.0000 mg | ORAL_TABLET | Freq: Every day | ORAL | 0 refills | Status: DC

## 2017-07-22 MED ORDER — LEVOTHYROXINE SODIUM 25 MCG PO TABS: 25.0000 ug | ORAL_TABLET | Freq: Every day | ORAL | 0 refills | Status: DC

## 2017-07-22 NOTE — Telephone Encounter (Signed)
Meds ordered this encounter  Medications  . traMADol (ULTRAM) 50 MG tablet    Sig: Take 1 tablet (50 mg total) by mouth every 8 (eight) hours as needed.    Dispense:  30 tablet    Refill:  0    May fill 30 days after date on prescription  . traMADol (ULTRAM) 50 MG tablet    Sig: Take 1 tablet (50 mg total) by mouth every 8 (eight) hours as needed.    Dispense:  30 tablet    Refill:  0    May fill 60 days after date on prescription  . traMADol (ULTRAM) 50 MG tablet    Sig: Take 1 tablet (50 mg total) by mouth every 8 (eight) hours as needed.    Dispense:  30 tablet    Refill:  0  . meloxicam (MOBIC) 15 MG tablet    Sig: Take 1 tablet (15 mg total) by mouth daily.    Dispense:  90 tablet    Refill:  0

## 2017-07-23 ENCOUNTER — Telehealth: Payer: Self-pay

## 2017-07-23 NOTE — Telephone Encounter (Signed)
Left VM with Pt advising that she has Rx ready for pickup at the [facility] [address].

## 2017-07-28 MED ORDER — TRAMADOL HCL 50 MG PO TABS: 50.0000 mg | ORAL_TABLET | Freq: Three times a day (TID) | ORAL | 0 refills | Status: DC | PRN

## 2017-07-28 MED ORDER — MELOXICAM 15 MG PO TABS: 15.0000 mg | ORAL_TABLET | Freq: Every day | ORAL | 0 refills | Status: DC

## 2017-07-28 NOTE — Telephone Encounter (Signed)
Meds ordered this encounter  Medications  . meloxicam (MOBIC) 15 MG tablet    Sig: Take 1 tablet (15 mg total) by mouth daily.    Dispense:  90 tablet    Refill:  0    Order Specific Question:   Supervising Provider    Answer:   SHAW, EVA N [[id number]]  . traMADol (ULTRAM) 50 MG tablet    Sig: Take 1 tablet (50 mg total) by mouth every 8 (eight) hours as needed.    Dispense:  30 tablet    Refill:  0    May fill 30 days after date on prescription    Order Specific Question:   Supervising Provider    Answer:   Clelia Croft, EVA N [[id number]]  . traMADol (ULTRAM) 50 MG tablet    Sig: Take 1 tablet (50 mg total) by mouth every 8 (eight) hours as needed.    Dispense:  30 tablet    Refill:  0    May fill 60 days after date on prescription    Order Specific Question:   Supervising Provider    Answer:   Clelia Croft, EVA N [[id number]]  . traMADol (ULTRAM) 50 MG tablet    Sig: Take 1 tablet (50 mg total) by mouth every 8 (eight) hours as needed.    Dispense:  30 tablet    Refill:  0    Order Specific Question:   Supervising Provider    Answer:   Sherren Mocha [phone]   Patient notified via My Chart.

## 2017-07-29 ENCOUNTER — Telehealth: Payer: Self-pay

## 2017-08-02 IMAGING — CT CT L SPINE W/O CM
4 of 10 series · 12 of 33 positions shown, 14 images · non-contrast
Comparison: [REDACTED] Lumbar MRI
10/28/2014, and earlier

CLINICAL DATA: 54-year-old female with lumbar back pain radiating
to both lower extremities with numbness and tingling greater on the
left. Prior surgery. Subsequent encounter.

EXAM:
CT LUMBAR SPINE WITHOUT CONTRAST
TECHNIQUE: Multidetector CT imaging of the lumbar spine was performed without
intravenous contrast administration. Multiplanar CT image
reconstructions were also generated.

[Series 4: l spine bone · axial · 0.27mm/px · z∈[-26,+47]mm · 2 of 88 slices shown, 3 images]
[im 30/88  soft-tissue]
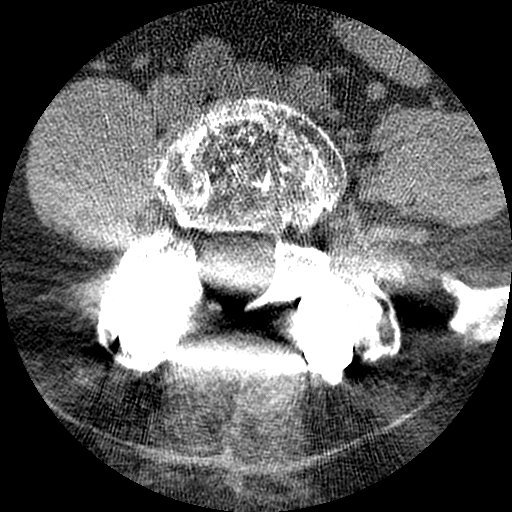
[im 30/88  bone]
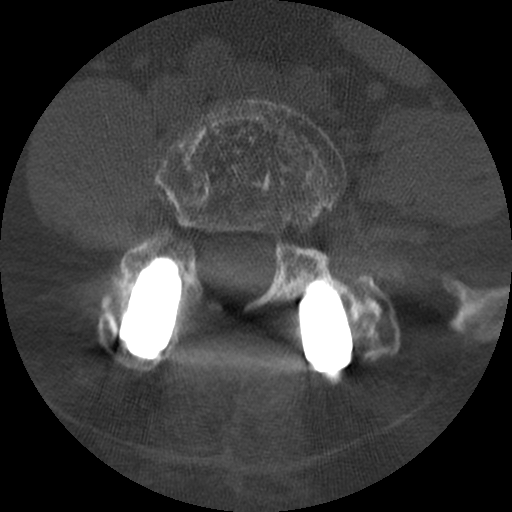
[im 59/88  bone]
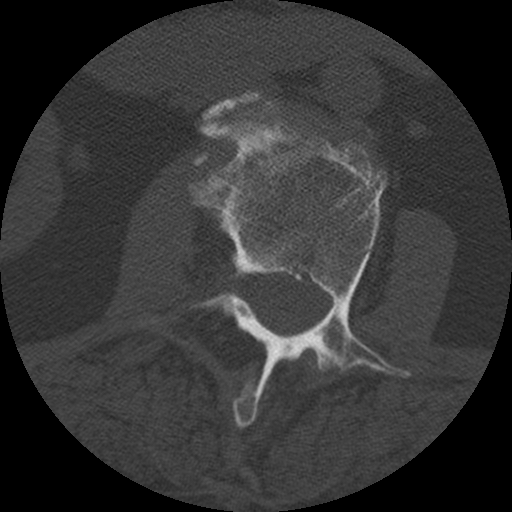

[Series 5: l-spine detail · axial · 0.27mm/px · z∈[-28,+44]mm · 2 of 87 slices shown]
[im 29/87  bone]
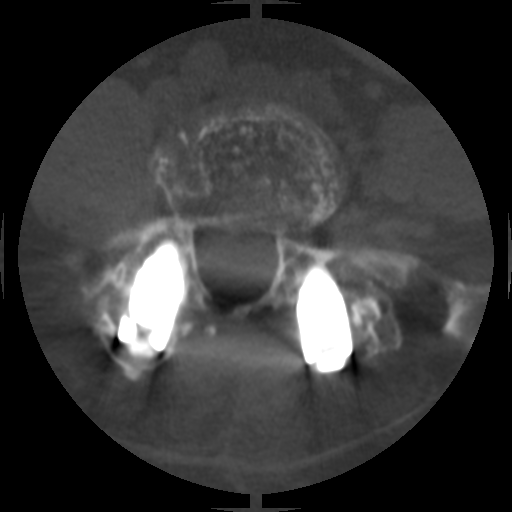
[im 58/87  bone]
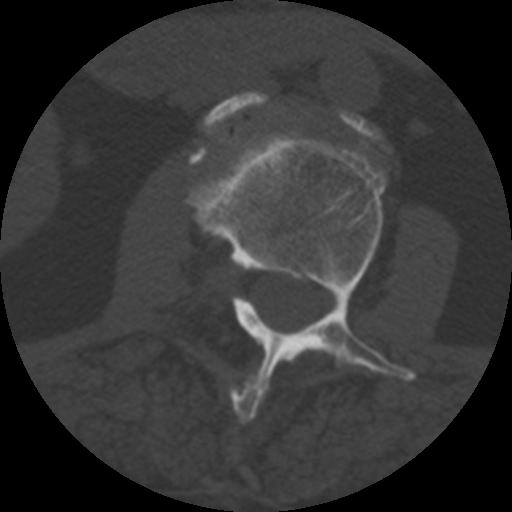

[Series 200: coronal · coronal · 0.44mm/px · 3 of 63 slices shown]
[im 13/63  bone]
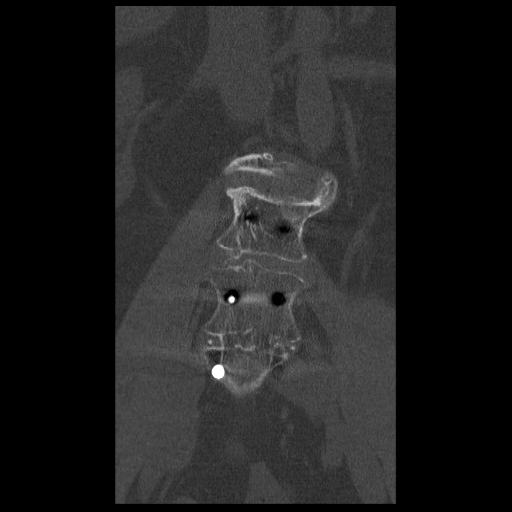
[im 25/63  bone]
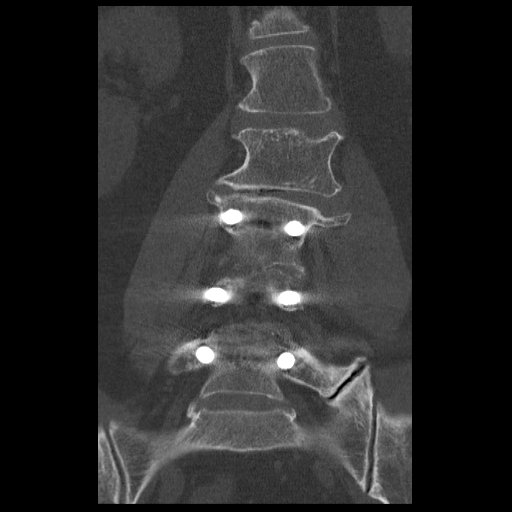
[im 38/63  bone]
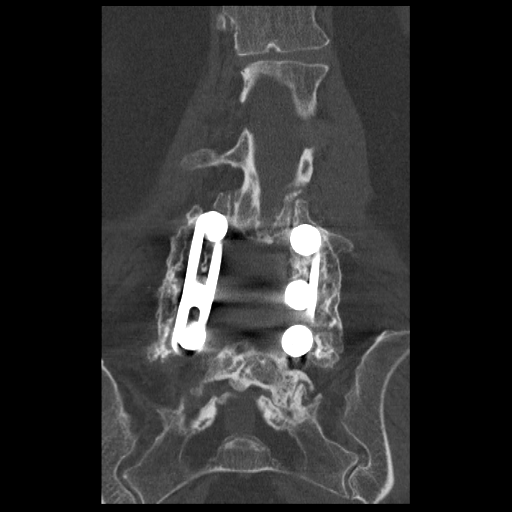

[Series 201: sagittal · sagittal · 0.44mm/px · 5 of 60 slices shown, 6 images]
[im 20/60  bone]
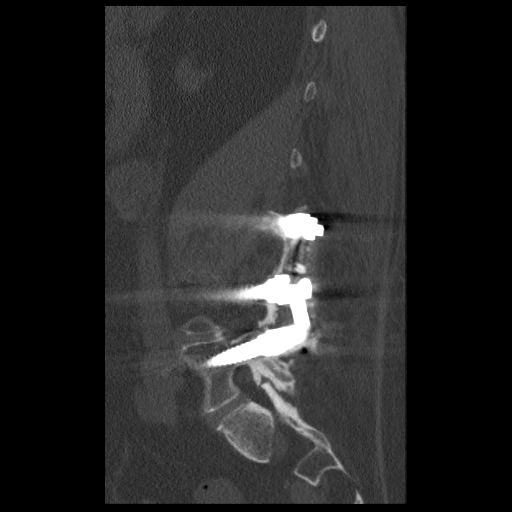
[im 25/60  bone]
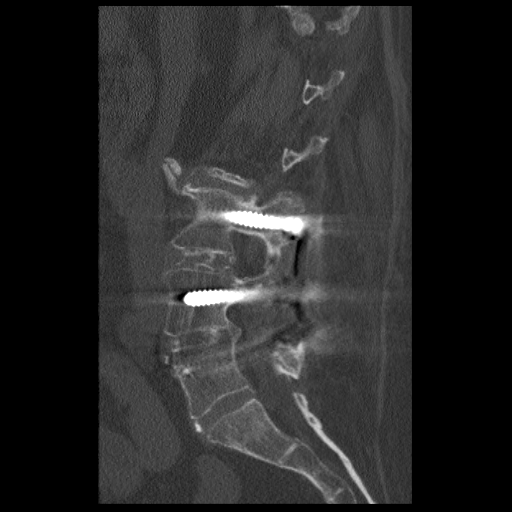
[im 30/60  soft-tissue]
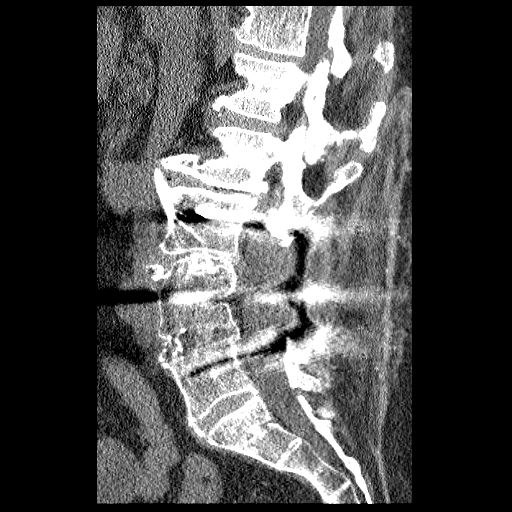
[im 30/60  bone]
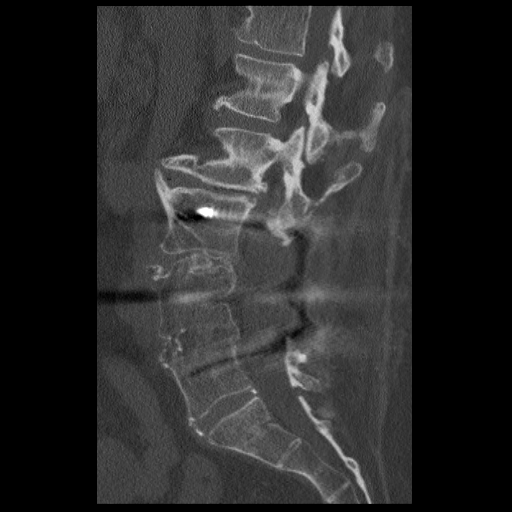
[im 35/60  bone]
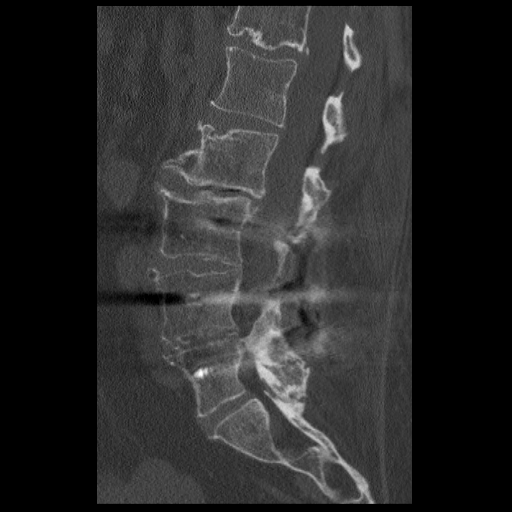
[im 40/60  bone]
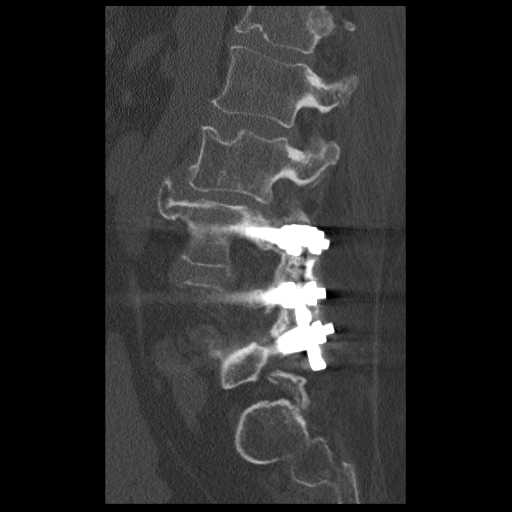

[12 of 33 positions shown; findings below may reference images not displayed]

FINDINGS: Same numbering system as in 6061. Postoperative changes from the L3
to the L5 level, detailed below. Stable vertebral height and
alignment including mild retrolisthesis at L2-L3. Underlying
levoconvex lumbar scoliosis. Visible sacrum and SI joints are
intact.

Negative visualized noncontrast abdominal viscera. Stable
postoperative changes to the posterior paraspinal soft tissues.

T12-L1: Chronic circumferential disc osteophyte complex. No
stenosis.

L1-L2: Chronic left eccentric circumferential disc bulge. Chronic
moderate facet hypertrophy greater on the right. Stable, no definite
stenosis.

L2-L3: Chronic mild retrolisthesis and severe disc space loss. Bulky
anterior eccentric circumferential disc osteophyte complex. Vacuum
disc. Mild to moderate facet hypertrophy. Mild to moderate right
neural foraminal stenosis and borderline to mild spinal stenosis
appears stable.

L3-L4: Previous decompression and transpedicular hardware placement.
Pedicle screws intact without loosening. Some interbody
ossification, but solid-appearing arthrodesis is limited to the
posterior elements (coronal image 37). Widely patent thecal sac. No
stenosis.

L4-L5: Previous decompression and transpedicular hardware placement.
Pedicle screws intact without loosening. Positive interbody and
posterior element arthrodesis. Thecal sac widely patent. No
stenosis.

L5-S1: Negative disc. Severe facet hypertrophy greater on the left.
Bulky left side L5-S1 assimilation joint with vacuum phenomena and
osteophytosis (coronal image 25). Vacuum facet phenomena on the
left. No stenosis.
IMPRESSION: 1. Stable postoperative appearance of L3-L4 and L4-L5. Solid
arthrodesis at both levels and no evidence of hardware loosening.
2. Adjacent segment disease at L2-L3 with advanced disc and endplate
degeneration. Adjacent segment disease at L5-S1 with advanced
posterior element and left side assimilation joint degeneration.
3. Subsequent mild to moderate right L2 foraminal stenosis and
borderline to mild L2-L3 spinal stenosis appears stable since [DATE]. Stable T12-L1 and L1-L2 disc and posterior element degeneration.

## 2017-08-06 ENCOUNTER — Ambulatory Visit (INDEPENDENT_AMBULATORY_CARE_PROVIDER_SITE_OTHER): Payer: BLUE CROSS/BLUE SHIELD

## 2017-08-06 VITALS — BP 122/80 | HR 74 | Ht 65.0 in | Wt 199.5 lb

## 2017-08-06 DIAGNOSIS — Z0000 Encounter for general adult medical examination without abnormal findings: Secondary | ICD-10-CM

## 2017-08-06 DIAGNOSIS — Z1231 Encounter for screening mammogram for malignant neoplasm of breast: Secondary | ICD-10-CM | POA: Diagnosis not present

## 2017-08-06 DIAGNOSIS — Z23 Encounter for immunization: Secondary | ICD-10-CM

## 2017-08-06 DIAGNOSIS — Z1239 Encounter for other screening for malignant neoplasm of breast: Secondary | ICD-10-CM

## 2017-08-06 NOTE — Patient Instructions (Addendum)
[name] , Thank you for taking time to come for your Medicare Wellness Visit. I appreciate your ongoing commitment to your health goals. Please review the following plan we discussed and let me know if I can assist you in the future.   Screening recommendations/referrals: Colonoscopy: up to date, next due 02/10/2024 Mammogram: due, [facility] center will call you about appointment Bone Density: N/A Recommended yearly ophthalmology/optometry visit for glaucoma screening and checkup Recommended yearly dental visit for hygiene and checkup  Vaccinations: Influenza vaccine: administered today  Pneumococcal vaccine: up to date, Prevnar 75 at age 4 Tdap vaccine: up to date, next due 10/28/2023 Shingles vaccine: check with your pharmacy about receiving this vaccine  Advanced directives: Advance directive discussed with you today. I have provided a copy for you to complete at home and have notarized. Once this is complete please bring a copy in to our office so we can scan it into your chart.   Conditions/risks identified: Try to get back down to 135 lbs in the near future by dieting and exercising to help with overall health.   Next appointment: 08/13/17 @ 10 am with [place] [facility], Female Preventive care refers to lifestyle choices and visits with your health care provider that can promote health and wellness. What does preventive care include?  A yearly physical exam. This is also called an annual well check.  Dental exams once or twice a year.  Routine eye exams. Ask your health care provider how often you should have your eyes checked.  Personal lifestyle choices, including:  Daily care of your teeth and gums.  Regular physical activity.  Eating a healthy diet.  Avoiding tobacco and drug use.  Limiting alcohol use.  Practicing safe sex.  Taking low-dose aspirin daily starting at age 5.  Taking vitamin and mineral supplements as recommended by  your health care provider. What happens during an annual well check? The services and screenings done by your health care provider during your annual well check will depend on your age, overall health, lifestyle risk factors, and family history of disease. Counseling  Your health care provider may ask you questions about your:  Alcohol use.  Tobacco use.  Drug use.  Emotional well-being.  Home and relationship well-being.  Sexual activity.  Eating habits.  Work and work Statistician.  Method of birth control.  Menstrual cycle.  Pregnancy history. Screening  You may have the following tests or measurements:  Height, weight, and BMI.  Blood pressure.  Lipid and cholesterol levels. These may be checked every 5 years, or more frequently if you are over 61 years old.  Skin check.  Lung cancer screening. You may have this screening every year starting at age 27 if you have a 30-pack-year history of smoking and currently smoke or have quit within the past 15 years.  Fecal occult blood test (FOBT) of the stool. You may have this test every year starting at age 57.  Flexible sigmoidoscopy or colonoscopy. You may have a sigmoidoscopy every 5 years or a colonoscopy every 10 years starting at age 59.  Hepatitis C blood test.  Hepatitis B blood test.  Sexually transmitted disease (STD) testing.  Diabetes screening. This is done by checking your blood sugar (glucose) after you have not eaten for a while (fasting). You may have this done every 1-3 years.  Mammogram. This may be done every 1-2 years. Talk to your health care provider about when you should start having regular mammograms.  This may depend on whether you have a family history of breast cancer.  BRCA-related cancer screening. This may be done if you have a family history of breast, ovarian, tubal, or peritoneal cancers.  Pelvic exam and Pap test. This may be done every 3 years starting at age 3. Starting at age  31, this may be done every 5 years if you have a Pap test in combination with an HPV test.  Bone density scan. This is done to screen for osteoporosis. You may have this scan if you are at high risk for osteoporosis. Discuss your test results, treatment options, and if necessary, the need for more tests with your health care provider. Vaccines  Your health care provider may recommend certain vaccines, such as:  Influenza vaccine. This is recommended every year.  Tetanus, diphtheria, and acellular pertussis (Tdap, Td) vaccine. You may need a Td booster every 10 years.  Zoster vaccine. You may need this after age 53.  Pneumococcal 13-valent conjugate (PCV13) vaccine. You may need this if you have certain conditions and were not previously vaccinated.  Pneumococcal polysaccharide (PPSV23) vaccine. You may need one or two doses if you smoke cigarettes or if you have certain conditions. Talk to your health care provider about which screenings and vaccines you need and how often you need them. This information is not intended to replace advice given to you by your health care provider. Make sure you discuss any questions you have with your health care provider. Document Released: 11/25/2015 Document Revised: 07/18/2016 Document Reviewed: 08/30/2015 Elsevier Interactive Patient Education  2017 [place] Prevention in the Home Falls can cause injuries. They can happen to people of all ages. There are many things you can do to make your home safe and to help prevent falls. What can I do on the outside of my home?  Regularly fix the edges of walkways and driveways and fix any cracks.  Remove anything that might make you trip as you walk through a door, such as a raised step or threshold.  Trim any bushes or trees on the path to your home.  Use bright outdoor lighting.  Clear any walking paths of anything that might make someone trip, such as rocks or tools.  Regularly check to  see if handrails are loose or broken. Make sure that both sides of any steps have handrails.  Any raised decks and porches should have guardrails on the edges.  Have any leaves, snow, or ice cleared regularly.  Use sand or salt on walking paths during winter.  Clean up any spills in your garage right away. This includes oil or grease spills. What can I do in the bathroom?  Use night lights.  Install grab bars by the toilet and in the tub and shower. Do not use towel bars as grab bars.  Use non-skid mats or decals in the tub or shower.  If you need to sit down in the shower, use a plastic, non-slip stool.  Keep the floor dry. Clean up any water that spills on the floor as soon as it happens.  Remove soap buildup in the tub or shower regularly.  Attach bath mats securely with double-sided non-slip rug tape.  Do not have throw rugs and other things on the floor that can make you trip. What can I do in the bedroom?  Use night lights.  Make sure that you have a light by your bed that is easy to reach.  Do  not use any sheets or blankets that are too big for your bed. They should not hang down onto the floor.  Have a firm chair that has side arms. You can use this for support while you get dressed.  Do not have throw rugs and other things on the floor that can make you trip. What can I do in the kitchen?  Clean up any spills right away.  Avoid walking on wet floors.  Keep items that you use a lot in easy-to-reach places.  If you need to reach something above you, use a strong step stool that has a grab bar.  Keep electrical cords out of the way.  Do not use floor polish or wax that makes floors slippery. If you must use wax, use non-skid floor wax.  Do not have throw rugs and other things on the floor that can make you trip. What can I do with my stairs?  Do not leave any items on the stairs.  Make sure that there are handrails on both sides of the stairs and use  them. Fix handrails that are broken or loose. Make sure that handrails are as long as the stairways.  Check any carpeting to make sure that it is firmly attached to the stairs. Fix any carpet that is loose or worn.  Avoid having throw rugs at the top or bottom of the stairs. If you do have throw rugs, attach them to the floor with carpet tape.  Make sure that you have a light switch at the top of the stairs and the bottom of the stairs. If you do not have them, ask someone to add them for you. What else can I do to help prevent falls?  Wear shoes that:  Do not have high heels.  Have rubber bottoms.  Are comfortable and fit you well.  Are closed at the toe. Do not wear sandals.  If you use a stepladder:  Make sure that it is fully opened. Do not climb a closed stepladder.  Make sure that both sides of the stepladder are locked into place.  Ask someone to hold it for you, if possible.  Clearly mark and make sure that you can see:  Any grab bars or handrails.  First and last steps.  Where the edge of each step is.  Use tools that help you move around (mobility aids) if they are needed. These include:  Canes.  Walkers.  Scooters.  Crutches.  Turn on the lights when you go into a dark area. Replace any light bulbs as soon as they burn out.  Set up your furniture so you have a clear path. Avoid moving your furniture around.  If any of your floors are uneven, fix them.  If there are any pets around you, be aware of where they are.  Review your medicines with your doctor. Some medicines can make you feel dizzy. This can increase your chance of falling. Ask your doctor what other things that you can do to help prevent falls. This information is not intended to replace advice given to you by your health care provider. Make sure you discuss any questions you have with your health care provider. Document Released: 08/25/2009 Document Revised: 04/05/2016 Document Reviewed:  12/03/2014 Elsevier Interactive Patient Education  2017 [employer].

## 2017-08-06 NOTE — Progress Notes (Signed)
Subjective:   Cathy Fuller is a 56 y.o. female who presents for Medicare Annual (Subsequent) preventive examination.  Review of Systems:  N/A Cardiac Risk Factors include: diabetes mellitus;hypertension;dyslipidemia;obesity (BMI >30kg/m2)     Objective:     Vitals: BP 122/80   Pulse 74   Ht  (1.651 m)   Wt 199 lb 8 oz (90.5 kg)   SpO2 97%   BMI 33.20 kg/m   Body mass index is 33.2 kg/m.   Tobacco History  Smoking Status  . Never Smoker  Smokeless Tobacco  . Never Used     Counseling given: Not Answered   Past Medical History:  Diagnosis Date  . Anemia   . Barrett's esophagus   . Blood transfusion without reported diagnosis   . Complication of anesthesia    1978 hard time waking up after surgery and low blood pressure  . Diabetes mellitus without complication (HCC)   . Esophageal stricture   . Fatty liver 2009  . GERD (gastroesophageal reflux disease)   . Hepatitis A 1983  . Hx of adenomatous colonic polyps   . Hyperlipidemia   . Hyperplastic colon polyp   . Hypertension   . Hypothyroidism   . IBS (irritable bowel syndrome)   . Internal hemorrhoids   . NASH (nonalcoholic steatohepatitis)    grade 1 fibrosis  . Neuromuscular disorder (HCC)    MILD CASE OF MG - per patient not MS  . PUD (peptic ulcer disease)   . Sleep apnea   . Stroke-like symptoms 1978   drug induced stroke  . Thyroid disease    Past Surgical History:  Procedure Laterality Date  . ABDOMINAL HYSTERECTOMY    . ANTERIOR LAT LUMBAR FUSION Right 12/27/2015   Procedure: Right Sided Lumbar tw-three Lateral lumbar interbody fusion with lateral plate;  Surgeon: Loura Halt Ditty, MD;  Location: [facility];  Service: Neurosurgery;  Laterality: Right;  Right Sided L2-3 Lateral lumbar interbody fusion with lateral plate  . BACK SURGERY    . BLADDER SURGERY     x 2  . BREAST BIOPSY    . CARPAL TUNNEL RELEASE Bilateral   . CHOLECYSTECTOMY    . CHOLECYSTECTOMY, LAPAROSCOPIC  1998    . COLONOSCOPY    . HERNIA REPAIR    . KNEE SURGERY Left   . LUMBAR FUSION     2017  . SHOULDER SURGERY Left   . TONSILLECTOMY     Family History  Problem Relation Age of Onset  . Thyroid disease Mother   . Stroke Mother   . Heart disease Mother   . Hyperlipidemia Mother   . Hypertension Mother   . Stroke Father   . Heart disease Father   . Hyperlipidemia Father   . Hypertension Father   . Diabetes Brother   . Colitis Daughter   . Stroke Maternal Grandmother   . Heart disease Maternal Grandfather        HEART ATTACK  . Heart disease Paternal Grandfather        HEART ATTACK  . Colon cancer Maternal Uncle    History  Sexual Activity  . Sexual activity: Not on file    Outpatient Encounter Prescriptions as of 08/06/2017  Medication Sig  . atorvastatin (LIPITOR) 20 MG tablet Take 1 tablet (20 mg total) by mouth daily.  . cyclobenzaprine (FLEXERIL) 10 MG tablet Take 1 tablet (10 mg total) by mouth 3 (three) times daily as needed.  . dapagliflozin propanediol (FARXIGA) 5 MG TABS  tablet Take 5 mg by mouth daily. Reported on 04/17/2016  . famotidine (PEPCID) 20 MG tablet Take 40 mg by mouth at bedtime.   Marland Kitchen glipiZIDE (GLUCOTROL) 10 MG tablet Take 1 tablet (10 mg total) by mouth 3 (three) times daily before meals.  Marland Kitchen levothyroxine (SYNTHROID, LEVOTHROID) 25 MCG tablet Take 1 tablet (25 mcg total) by mouth daily.  Marland Kitchen losartan (COZAAR) 25 MG tablet Take 1 tablet (25 mg total) by mouth daily.  . meloxicam (MOBIC) 15 MG tablet Take 1 tablet (15 mg total) by mouth daily.  Marland Kitchen nystatin (MYCOSTATIN/NYSTOP) powder Apply topically 4 (four) times daily.  . traMADol (ULTRAM) 50 MG tablet Take 1 tablet (50 mg total) by mouth every 8 (eight) hours as needed.  . traMADol (ULTRAM) 50 MG tablet Take 1 tablet (50 mg total) by mouth every 8 (eight) hours as needed.  . traMADol (ULTRAM) 50 MG tablet Take 50 mg by mouth every 8 (eight) hours as needed.  . [DISCONTINUED] KRILL OIL PO Take 500 mg by mouth  daily.   . [DISCONTINUED] milk thistle 175 MG tablet Take 175 mg by mouth daily. Reported on 04/17/2016  . [DISCONTINUED] traMADol (ULTRAM) 50 MG tablet Take 1 tablet (50 mg total) by mouth every 8 (eight) hours as needed.  . [DISCONTINUED] Turmeric Curcumin 500 MG CAPS Take 1,500 mg by mouth daily.   No facility-administered encounter medications on file as of 08/06/2017.     Activities of Daily Living In your present state of health, do you have any difficulty performing the following activities: 08/06/2017 05/07/2017  Hearing? Y N  Comment Patient has nerve deafness from [employer] -  Vision? Y N  Comment Patient sees eye doctor yearly -  Difficulty concentrating or making decisions? Y N  Comment off and on -  Walking or climbing stairs? Y N  Comment Patient has knee discomfort -  Dressing or bathing? N N  Doing errands, shopping? N N  Preparing Food and eating ? N -  Using the Toilet? N -  In the past six months, have you accidently leaked urine? Y -  Comment Patient has a bladder sling -  Do you have problems with loss of bowel control? N -  Managing your Medications? N -  Managing your Finances? N -  Housekeeping or managing your Housekeeping? N -  Some recent data might be hidden    Patient Care Team: Porfirio Oar, PA-C as PCP - General (Physician Assistant) Lunette Stands, MD as Consulting Physician (Orthopedic Surgery) Hart Carwin, MD (Inactive) as Consulting Physician (Gastroenterology)    Assessment:     Exercise Activities and Dietary recommendations Current Exercise Habits: The patient does not participate in regular exercise at present (stays active in yard), Exercise limited by: None identified  Goals    . Weight (lb) < 135 lb (61.2 kg)          Patient wants to try to get back down to 135 lbs in the near future.      Fall Risk Fall Risk  08/06/2017 05/07/2017 01/29/2017 10/16/2016 07/31/2016  Falls in the past year? Yes No Yes Yes No  Comment - - - - -    Number falls in past yr: 1 - 1 2 or more -  Injury with Fall? No - No Yes -  Comment - - - - -  Risk for fall due to : - - - Other (Comment) -  Risk for fall due to: Comment - - - falls  before having back surgery non after 11/23/15 -  Follow up Falls prevention discussed - - Falls prevention discussed -   Depression Screen PHQ 2/9 Scores 08/06/2017 05/07/2017 01/29/2017 10/16/2016  PHQ - 2 Score 0 0 3 0  PHQ- 9 Score - - 7 -     Cognitive Function     6CIT Screen 08/06/2017  What Year? 0 points  What month? 0 points  What time? 0 points  Count back from 20 0 points  Months in reverse 0 points  Repeat phrase 2 points  Total Score 2    Immunization History  Administered Date(s) Administered  . Influenza, Seasonal, Injecte, Preservative Fre 12/02/2012  . Influenza,inj,Quad PF,6+ Mos 10/27/2013, 07/12/2015, 10/16/2016, 08/06/2017  . Pneumococcal Polysaccharide-23 10/27/2013  . Tdap 10/27/2013   Screening Tests Health Maintenance  Topic Date Due  . MAMMOGRAM  06/09/2017  . HEMOGLOBIN A1C  11/06/2017  . OPHTHALMOLOGY EXAM  01/08/2018  . FOOT EXAM  05/07/2018  . PNEUMOCOCCAL POLYSACCHARIDE VACCINE (2) 10/27/2018  . TETANUS/TDAP  10/28/2023  . COLONOSCOPY  02/10/2024  . INFLUENZA VACCINE  Completed  . Hepatitis C Screening  Completed  . HIV Screening  Completed      Plan:   I have personally reviewed and noted the following in the patient's chart:   . Medical and social history . Use of alcohol, tobacco or illicit drugs  . Current medications and supplements . Functional ability and status . Nutritional status . Physical activity . Advanced directives . List of other physicians . Hospitalizations, surgeries, and ER visits in previous 12 months . Vitals . Screenings to include cognitive, depression, and falls . Referrals and appointments  In addition, I have reviewed and discussed with patient certain preventive protocols, quality metrics, and best practice  recommendations. A written personalized care plan for preventive services as well as general preventive health recommendations were provided to patient.  Mammogram ordered. Patient wants to wait and have blood work done at appointment with PCP.    Janalyn Shy, LPN  [id number]/[id number]

## 2017-08-08 ENCOUNTER — Encounter: Payer: Self-pay | Admitting: Physician Assistant

## 2017-08-11 ENCOUNTER — Other Ambulatory Visit: Payer: Self-pay | Admitting: Physician Assistant

## 2017-08-13 ENCOUNTER — Encounter: Payer: Self-pay | Admitting: Physician Assistant

## 2017-08-13 ENCOUNTER — Ambulatory Visit (INDEPENDENT_AMBULATORY_CARE_PROVIDER_SITE_OTHER): Payer: BLUE CROSS/BLUE SHIELD | Admitting: Physician Assistant

## 2017-08-13 VITALS — BP 110/70 | HR 89 | Temp 98.5°F | Resp 18 | Ht 65.0 in | Wt 200.0 lb

## 2017-08-13 DIAGNOSIS — E785 Hyperlipidemia, unspecified: Secondary | ICD-10-CM | POA: Diagnosis not present

## 2017-08-13 DIAGNOSIS — E119 Type 2 diabetes mellitus without complications: Secondary | ICD-10-CM | POA: Diagnosis not present

## 2017-08-13 DIAGNOSIS — I10 Essential (primary) hypertension: Secondary | ICD-10-CM | POA: Diagnosis not present

## 2017-08-13 DIAGNOSIS — Z1231 Encounter for screening mammogram for malignant neoplasm of breast: Secondary | ICD-10-CM

## 2017-08-13 DIAGNOSIS — M545 Low back pain: Secondary | ICD-10-CM

## 2017-08-13 DIAGNOSIS — G8929 Other chronic pain: Secondary | ICD-10-CM

## 2017-08-13 DIAGNOSIS — E039 Hypothyroidism, unspecified: Secondary | ICD-10-CM

## 2017-08-13 DIAGNOSIS — M25562 Pain in left knee: Secondary | ICD-10-CM

## 2017-08-13 LAB — POCT GLYCOSYLATED HEMOGLOBIN (HGB A1C): HEMOGLOBIN A1C: 6.7

## 2017-08-13 MED ORDER — GLIPIZIDE 10 MG PO TABS: 10.0000 mg | ORAL_TABLET | Freq: Three times a day (TID) | ORAL | 3 refills | Status: DC

## 2017-08-13 MED ORDER — LEVOTHYROXINE SODIUM 25 MCG PO TABS: 25.0000 ug | ORAL_TABLET | Freq: Every day | ORAL | 3 refills | Status: AC

## 2017-08-13 MED ORDER — LOSARTAN POTASSIUM 25 MG PO TABS: 25.0000 mg | ORAL_TABLET | Freq: Every day | ORAL | 3 refills | Status: DC

## 2017-08-13 MED ORDER — DAPAGLIFLOZIN PROPANEDIOL 5 MG PO TABS: 5.0000 mg | ORAL_TABLET | Freq: Every day | ORAL | 3 refills | Status: DC

## 2017-08-13 MED ORDER — CYCLOBENZAPRINE HCL 10 MG PO TABS: 10.0000 mg | ORAL_TABLET | Freq: Three times a day (TID) | ORAL | 1 refills | Status: DC | PRN

## 2017-08-13 MED ORDER — ATORVASTATIN CALCIUM 20 MG PO TABS: 20.0000 mg | ORAL_TABLET | Freq: Every day | ORAL | 3 refills | Status: DC

## 2017-08-13 MED ORDER — MELOXICAM 15 MG PO TABS: 15.0000 mg | ORAL_TABLET | Freq: Every day | ORAL | 1 refills | Status: DC

## 2017-08-13 NOTE — Assessment & Plan Note (Signed)
Update labs. Anticipate normalization now that she is back on her own dose.

## 2017-08-13 NOTE — Progress Notes (Signed)
Patient ID: Cathy Fuller, female    DOB: 1961/08/09, 56 y.o.   MRN: [id number]  PCP: Porfirio Oar, PA-C  Chief Complaint  Patient presents with  . Diabetes    Pt states she has brought records of her glucose readings.  . Follow-up    Subjective:   Presents for evaluation of Diabetes.  Diabetes had been well controlled, but then over the past year she has lost control. In part, that was due to dietary changes during a [place]  Has made some dramatic eating changes, specifically around elimination of carbohydrates.  Has lost inches in the thighs and waist, though she still has considerable redundent skin that makes finding clothes that fit difficult. Has seen her home glucose readings go from routinely in the 300's to always less than 150. She has reduce the morning dose of glipizide to half, and hopes to reduce the evening dose as well. Tolerating the [place] well. Lowest readings occur mid-morning, just before lunch, and she feels shaky. A1C in 04/2017 was 10.5 (8.4 in 01/2017, 7.2 in 10/2016, 6.1 in 07/2016)  Takes the meloxicam MWF due to LEFT kidney pain if she takes it daily.  In June, her TSH was suppressed. She now relates that may have been due to taking her mother's pills, which were a higher dose. She has returned to using her own product since then.  Has not had a mammogram in the past 2 years.   Lab Results  Component Value Date   CHOL 129 05/07/2017   HDL 49 05/07/2017   LDLCALC 47 05/07/2017   TRIG 163 (H) 05/07/2017   CHOLHDL 2.6 05/07/2017      Review of Systems  Constitutional: Negative.  Negative for activity change, appetite change, fatigue and unexpected weight change.  HENT: Negative for congestion, dental problem, ear pain, hearing loss, mouth sores, postnasal drip, rhinorrhea, sneezing, sore throat, tinnitus and trouble swallowing.   Eyes: Negative for photophobia, pain, redness and visual  disturbance.  Respiratory: Negative for cough, chest tightness, shortness of breath and wheezing.   Cardiovascular: Negative for chest pain, palpitations and leg swelling.  Gastrointestinal: Negative for abdominal pain, blood in stool, constipation, diarrhea, nausea and vomiting.  Endocrine: Negative for cold intolerance, heat intolerance, polydipsia, polyphagia and polyuria.  Genitourinary: Negative for dysuria, frequency, hematuria and urgency.  Musculoskeletal: Positive for back pain. Negative for arthralgias, gait problem, myalgias and neck stiffness.  Skin: Negative for rash.  Neurological: Positive for tremors ("shaky" with hypoglycemia). Negative for dizziness, speech difficulty, weakness, light-headedness, numbness and headaches.  Hematological: Negative for adenopathy.  Psychiatric/Behavioral: Negative for confusion, decreased concentration and sleep disturbance. The patient is not nervous/anxious.        Patient Active Problem List   Diagnosis Date Noted  . Chronic prescription opiate use 07/31/2016  . Hyperlipidemia 01/17/2016  . Spondylolisthesis of lumbar region 12/27/2015  . Essential hypertension   . S/P spinal fusion   . Obesity (BMI 30-39.9) 05/04/2014  . OSA (obstructive sleep apnea) 10/27/2013  . Hypothyroidism 01/30/2012  . Diabetes mellitus type 2, controlled, without complications (HCC) 05/29/2010  . BARRETTS ESOPHAGUS 05/29/2010  . FATTY LIVER DISEASE 05/29/2010  . COLONIC POLYPS, ADENOMATOUS, HX OF 05/29/2010     Prior to Admission medications   Medication Sig Start Date End Date Taking? Authorizing Provider  atorvastatin (LIPITOR) 20 MG tablet Take 1 tablet (20 mg total) by mouth daily. 02/02/17  Yes Kshawn Canal, PA-C  cyclobenzaprine (FLEXERIL)  10 MG tablet Take 1 tablet (10 mg total) by mouth 3 (three) times daily as needed. 07/31/16  Yes Coby Shrewsberry, PA-C  dapagliflozin propanediol (FARXIGA) 5 MG TABS tablet Take 5 mg by mouth daily. Reported on  04/17/2016 05/07/17  Yes Jonna Dittrich, PA-C  famotidine (PEPCID) 20 MG tablet Take 40 mg by mouth at bedtime.    Yes [provider]  glipiZIDE (GLUCOTROL) 10 MG tablet Take 1 tablet (10 mg total) by mouth 3 (three) times daily before meals. 08/25/16  Yes Genese Quebedeaux, PA-C  levothyroxine (SYNTHROID, LEVOTHROID) 25 MCG tablet Take 1 tablet (25 mcg total) by mouth daily. 07/22/17  Yes Rital Cavey, PA-C  losartan (COZAAR) 25 MG tablet Take 1 tablet (25 mg total) by mouth daily. 07/22/17  Yes Joeph Szatkowski, PA-C  meloxicam (MOBIC) 15 MG tablet Take 1 tablet (15 mg total) by mouth daily. 07/28/17  Yes Ashey Tramontana, PA-C  nystatin (MYCOSTATIN/NYSTOP) powder Apply topically 4 (four) times daily. 07/31/16  Yes Kathlyn Leachman, PA-C  traMADol (ULTRAM) 50 MG tablet Take 1 tablet (50 mg total) by mouth every 8 (eight) hours as needed. 07/28/17  Yes Alyene Predmore, PA-C  traMADol (ULTRAM) 50 MG tablet Take 1 tablet (50 mg total) by mouth every 8 (eight) hours as needed. 07/28/17  Yes Zaya Kessenich, PA-C  traMADol (ULTRAM) 50 MG tablet Take 50 mg by mouth every 8 (eight) hours as needed.   Yes [provider]     Allergies  Allergen Reactions  . Aspirin Tinitus  . Tape Other (See Comments)    Blisters       Objective:  Physical Exam  Constitutional: She is oriented to person, place, and time. She appears well-developed and well-nourished. No distress.  BP 110/70 (BP Location: Left Arm, Patient Position: Sitting, Cuff Size: Large)   Pulse 89   Temp 98.5 F (36.9 C) (Oral)   Resp 18   Ht  (1.651 m)   Wt 200 lb (90.7 kg)   SpO2 96%   BMI 33.28 kg/m    Eyes: Conjunctivae are normal. No scleral icterus.  Neck: No thyromegaly present.  Cardiovascular: Normal rate, regular rhythm, normal heart sounds and intact distal pulses.   Pulmonary/Chest: Effort normal and breath sounds normal.  Lymphadenopathy:    She has no cervical adenopathy.  Neurological: She is alert  and oriented to person, place, and time.  Skin: Skin is warm and dry.  Psychiatric: She has a normal mood and affect. Her speech is normal and behavior is normal.   Diabetic Foot Exam - Simple   Simple Foot Form Diabetic Foot exam was performed with the following findings:  Yes 08/13/2017 10:20 AM  Visual Inspection No deformities, no ulcerations, no other skin breakdown bilaterally:  Yes Sensation Testing Intact to touch and monofilament testing bilaterally:  Yes Pulse Check Posterior Tibialis and Dorsalis pulse intact bilaterally:  Yes Comments     Wt Readings from Last 3 Encounters:  08/13/17 200 lb (90.7 kg)  08/06/17 199 lb 8 oz (90.5 kg)  05/07/17 208 lb 9.6 oz (94.6 kg)    Results for orders placed or performed in visit on 08/13/17  POCT glycosylated hemoglobin (Hb A1C)  Result Value Ref Range   Hemoglobin A1C 6.7        Assessment & Plan:   Problem List Items Addressed This Visit    Diabetes mellitus type 2, controlled, without complications (HCC) - Primary    Significant improvement with dietary changes. Recommend continued efforts and  if she is running low, continue to reduce and stop glipizide if possible.      Relevant Medications   losartan (COZAAR) 25 MG tablet   glipiZIDE (GLUCOTROL) 10 MG tablet   dapagliflozin propanediol (FARXIGA) 5 MG TABS tablet   atorvastatin (LIPITOR) 20 MG tablet   Other Relevant Orders   Comprehensive metabolic panel   POCT glycosylated hemoglobin (Hb A1C) (Completed)   Hypothyroidism    Update labs. Anticipate normalization now that she is back on her own dose.      Relevant Medications   levothyroxine (SYNTHROID, LEVOTHROID) 25 MCG tablet   Other Relevant Orders   T4, free   TSH   Essential hypertension    Well controlled. Continue.      Relevant Medications   losartan (COZAAR) 25 MG tablet   atorvastatin (LIPITOR) 20 MG tablet   Other Relevant Orders   CBC with Differential/Platelet   Comprehensive metabolic  panel   Hyperlipidemia    Await labs. Adjust regimen as indicated by results.       Relevant Medications   losartan (COZAAR) 25 MG tablet   atorvastatin (LIPITOR) 20 MG tablet   Other Relevant Orders   Comprehensive metabolic panel   Lipid panel    Other Visit Diagnoses    Encounter for screening mammogram for breast cancer       Relevant Orders   MM DIGITAL SCREENING BILATERAL   Chronic low back pain, unspecified back pain laterality, with sciatica presence unspecified       Relevant Medications   meloxicam (MOBIC) 15 MG tablet   cyclobenzaprine (FLEXERIL) 10 MG tablet   Chronic pain of left knee       Relevant Medications   meloxicam (MOBIC) 15 MG tablet   cyclobenzaprine (FLEXERIL) 10 MG tablet       Return in about 3 months (around 11/13/2017) for re-evaluation of diabetes, thyroid, etc.   Fernande Bras, PA-C Primary Care at [facility]

## 2017-08-13 NOTE — Assessment & Plan Note (Signed)
Well controlled. Continue. 

## 2017-08-13 NOTE — Assessment & Plan Note (Signed)
Await labs. Adjust regimen as indicated by results.  

## 2017-08-13 NOTE — Patient Instructions (Addendum)
Keep up the great work. If you can STOP the glipizide, do so. Continue the [place].  We recommend that you schedule a mammogram for breast cancer screening. Typically, you do not need a referral to do this. Please contact a local imaging center to schedule your mammogram.  [facility] - [phone]  *ask for the Radiology Department [facility]) - [phone] or [phone]  [facility] - [phone] [facility] - [phone] [facility] - [phone]  *ask for the Radiology Department [facility] - [phone]  *ask for the Radiology Department [facility] - [phone]  *ask for the Mammography Department [facility] - [phone]   IF you received an x-ray today, you will receive an invoice from [facility]. Please contact [facility] at [phone] with questions or concerns regarding your invoice.   IF you received labwork today, you will receive an invoice from [place]. Please contact [facility] at [phone] with questions or concerns regarding your invoice.   Our billing staff will not be able to assist you with questions regarding bills from these companies.  You will be contacted with the lab results as soon as they are available. The fastest way to get your results is to activate your My Chart account. Instructions are located on the last page of this paperwork. If you have not heard from [place] regarding the results in 2 weeks, please contact this office.

## 2017-08-13 NOTE — Assessment & Plan Note (Signed)
Significant improvement with dietary changes. Recommend continued efforts and if she is running low, continue to reduce and stop glipizide if possible.

## 2017-08-14 LAB — COMPREHENSIVE METABOLIC PANEL
A/G RATIO: 1.5 (ref 1.2–2.2)
ALBUMIN: 4.3 g/dL (ref 3.5–5.5)
ALT: 22 IU/L (ref 0–32)
AST: 24 IU/L (ref 0–40)
Alkaline Phosphatase: 93 IU/L (ref 39–117)
BILIRUBIN TOTAL: 0.3 mg/dL (ref 0.0–1.2)
BUN / CREAT RATIO: 26 — AB (ref 9–23)
BUN: 27 mg/dL — ABNORMAL HIGH (ref 6–24)
CALCIUM: 9.1 mg/dL (ref 8.7–10.2)
CHLORIDE: 110 mmol/L — AB (ref 96–106)
CO2: 24 mmol/L (ref 20–29)
Creatinine, Ser: 1.04 mg/dL — ABNORMAL HIGH (ref 0.57–1.00)
GFR, EST AFRICAN AMERICAN: 68 mL/min/{1.73_m2} (ref >59)
GFR, EST NON AFRICAN AMERICAN: 59 mL/min/{1.73_m2} — AB (ref >59)
GLOBULIN, TOTAL: 2.9 g/dL (ref 1.5–4.5)
Glucose: 67 mg/dL (ref 65–99)
POTASSIUM: 4.6 mmol/L (ref 3.5–5.2)
Sodium: 144 mmol/L (ref 134–144)
TOTAL PROTEIN: 7.2 g/dL (ref 6.0–8.5)

## 2017-08-14 LAB — CBC WITH DIFFERENTIAL/PLATELET
BASOS: 0 %
Basophils Absolute: 0 10*3/uL (ref 0.0–0.2)
EOS (ABSOLUTE): 0.2 10*3/uL (ref 0.0–0.4)
EOS: 3 %
HEMATOCRIT: 39.7 % (ref 34.0–46.6)
HEMOGLOBIN: 12.9 g/dL (ref 11.1–15.9)
IMMATURE GRANS (ABS): 0 10*3/uL (ref 0.0–0.1)
IMMATURE GRANULOCYTES: 0 %
LYMPHS: 33 %
Lymphocytes Absolute: 1.9 10*3/uL (ref 0.7–3.1)
MCH: 28.9 pg (ref 26.6–33.0)
MCHC: 32.5 g/dL (ref 31.5–35.7)
MCV: 89 fL (ref 79–97)
Monocytes Absolute: 0.4 10*3/uL (ref 0.1–0.9)
Monocytes: 8 %
NEUTROS ABS: 3.2 10*3/uL (ref 1.4–7.0)
NEUTROS PCT: 56 %
Platelets: 184 10*3/uL (ref 150–379)
RBC: 4.46 x10E6/uL (ref 3.77–5.28)
RDW: 14.2 % (ref 12.3–15.4)
WBC: 5.8 10*3/uL (ref 3.4–10.8)

## 2017-08-14 LAB — LIPID PANEL
CHOL/HDL RATIO: 2.5 ratio (ref 0.0–4.4)
Cholesterol, Total: 109 mg/dL (ref 100–199)
HDL: 43 mg/dL (ref >39)
LDL Calculated: 48 mg/dL (ref 0–99)
Triglycerides: 90 mg/dL (ref 0–149)
VLDL Cholesterol Cal: 18 mg/dL (ref 5–40)

## 2017-08-14 LAB — T4, FREE: FREE T4: 1.38 ng/dL (ref 0.82–1.77)

## 2017-08-14 LAB — TSH: TSH: <0.006 u[IU]/mL — ABNORMAL LOW (ref 0.450–4.500)

## 2017-08-16 ENCOUNTER — Telehealth: Payer: Self-pay | Admitting: Family Medicine

## 2017-08-16 NOTE — Telephone Encounter (Signed)
PT WAS CALLING FOR FORM TO GET FILLED [name] FILLED IT OUT TODAY AND I FAXED FOR OVER COPY OF FORM IS IN THE SCAN BOX AND ORIGINAL COPY ID WAITING FOR PT TO PICK UP AT THE [facility]

## 2017-08-24 ENCOUNTER — Other Ambulatory Visit: Payer: Self-pay | Admitting: Physician Assistant

## 2017-08-24 DIAGNOSIS — I10 Essential (primary) hypertension: Secondary | ICD-10-CM

## 2017-12-17 ENCOUNTER — Other Ambulatory Visit: Payer: Self-pay | Admitting: Physician Assistant

## 2017-12-17 DIAGNOSIS — E119 Type 2 diabetes mellitus without complications: Secondary | ICD-10-CM

## 2018-01-13 LAB — HM DIABETES EYE EXAM

## 2018-02-17 ENCOUNTER — Encounter: Payer: Self-pay | Admitting: Physician Assistant

## 2018-02-22 ENCOUNTER — Encounter: Payer: Self-pay | Admitting: Physician Assistant

## 2018-02-22 ENCOUNTER — Other Ambulatory Visit: Payer: Self-pay

## 2018-02-22 ENCOUNTER — Ambulatory Visit (INDEPENDENT_AMBULATORY_CARE_PROVIDER_SITE_OTHER): Payer: BLUE CROSS/BLUE SHIELD | Admitting: Physician Assistant

## 2018-02-22 VITALS — BP 122/74 | HR 71 | Temp 98.1°F | Resp 18 | Ht 65.0 in | Wt 210.8 lb

## 2018-02-22 DIAGNOSIS — Z79891 Long term (current) use of opiate analgesic: Secondary | ICD-10-CM

## 2018-02-22 DIAGNOSIS — E782 Mixed hyperlipidemia: Secondary | ICD-10-CM

## 2018-02-22 DIAGNOSIS — M79645 Pain in left finger(s): Secondary | ICD-10-CM | POA: Diagnosis not present

## 2018-02-22 DIAGNOSIS — I10 Essential (primary) hypertension: Secondary | ICD-10-CM

## 2018-02-22 DIAGNOSIS — E119 Type 2 diabetes mellitus without complications: Secondary | ICD-10-CM

## 2018-02-22 DIAGNOSIS — M4316 Spondylolisthesis, lumbar region: Secondary | ICD-10-CM | POA: Diagnosis not present

## 2018-02-22 DIAGNOSIS — E039 Hypothyroidism, unspecified: Secondary | ICD-10-CM | POA: Diagnosis not present

## 2018-02-22 DIAGNOSIS — B372 Candidiasis of skin and nail: Secondary | ICD-10-CM | POA: Diagnosis not present

## 2018-02-22 MED ORDER — TRAMADOL HCL 50 MG PO TABS: 50.0000 mg | ORAL_TABLET | Freq: Three times a day (TID) | ORAL | 0 refills | Status: DC | PRN

## 2018-02-22 MED ORDER — KETOCONAZOLE 2 % EX CREA: 1.0000 "application " | TOPICAL_CREAM | Freq: Every day | CUTANEOUS | 5 refills | Status: DC

## 2018-02-22 NOTE — Assessment & Plan Note (Signed)
Continue pain management with tramadol.

## 2018-02-22 NOTE — Assessment & Plan Note (Signed)
Well controlled. Tolerated ARB, but caused mild hypotension.

## 2018-02-22 NOTE — Progress Notes (Signed)
Patient ID: Cathy Fuller, female    DOB: Feb 28, 1961, 57 y.o.   MRN: [id number]  PCP: Porfirio Oar, PA-C  Chief Complaint  Patient presents with  . Diabetes    follow up     Subjective:   Presents for evaluation of diabetes.  Has been out of tramadol for some time now. Missed her last appointment with me-didn't have it on her calendar and didn't get a reminder call. [place]CC[place]. All prescriptions are known to me. Last review of Controlled Substance agreement 07/2016. Update today.  Overdue for mammogram and eye exam.  Loss of diabetes control over the past year.  Keto diet is inconsistent. Her son, his girlfriend and their son are living with her, and are not contributing to the household upkeep. It's stressful, especially around meals. "I think that my A1C has slipped."  Stopped taking losartan, due to SBP <100 and recall.  Pain in the palm and radial aspect of the LEFT hand/wrist/forearm with movement of wiping her bottom. She has a history of CTS, s/p release. Thinks she still has her splints.   Review of Systems Denies chest pain, shortness of breath, HA, dizziness, vision change, nausea, vomiting, diarrhea, constipation, melena, hematochezia, dysuria, increased urinary urgency or frequency, increased hunger or thirst, unintentional weight change, unexplained myalgias or arthralgias, rash.   Depression screen [facility] 2/9 02/22/2018 08/13/2017 08/06/2017 05/07/2017 01/29/2017  Decreased Interest 0 0 0 0 0  Down, Depressed, Hopeless 0 0 0 0 3  PHQ - 2 Score 0 0 0 0 3  Altered sleeping 2 - - - 0  Tired, decreased energy 2 - - - 3  Change in appetite 1 - - - 0  Feeling bad or failure about yourself  0 - - - 1  Trouble concentrating 1 - - - 0  Moving slowly or fidgety/restless 0 - - - 0  Suicidal thoughts 0 - - - 0  PHQ-9 Score 6 - - - 7  Difficult doing work/chores Somewhat difficult - - - -     Patient Active Problem List   Diagnosis Date Noted  . Chronic  prescription opiate use 07/31/2016  . Hyperlipidemia 01/17/2016  . Spondylolisthesis of lumbar region 12/27/2015  . Essential hypertension   . S/P spinal fusion   . Obesity (BMI 30-39.9) 05/04/2014  . OSA (obstructive sleep apnea) 10/27/2013  . Hypothyroidism 01/30/2012  . Diabetes mellitus type 2, controlled, without complications (HCC) 05/29/2010  . BARRETTS ESOPHAGUS 05/29/2010  . FATTY LIVER DISEASE 05/29/2010  . COLONIC POLYPS, ADENOMATOUS, HX OF 05/29/2010     Prior to Admission medications   Medication Sig Start Date End Date Taking? Authorizing Provider  atorvastatin (LIPITOR) 20 MG tablet Take 1 tablet (20 mg total) by mouth daily. 08/13/17  Yes Kimra Kantor, PA-C  cyclobenzaprine (FLEXERIL) 10 MG tablet Take 1 tablet (10 mg total) by mouth 3 (three) times daily as needed. 08/13/17  Yes Tej Murdaugh, PA-C  dapagliflozin propanediol (FARXIGA) 5 MG TABS tablet Take 5 mg by mouth daily. Reported on 04/17/2016 08/13/17  Yes Andrienne Havener, PA-C  famotidine (PEPCID) 20 MG tablet Take 40 mg by mouth at bedtime.    Yes [provider]  glipiZIDE (GLUCOTROL) 10 MG tablet Take 1 tablet (10 mg total) by mouth 3 (three) times daily before meals. 08/13/17  Yes Juvenal Umar, PA-C  glipiZIDE (GLUCOTROL) 10 MG tablet TAKE 1 TABLET THREE TIMES A DAY BEFORE MEALS 12/18/17  Yes Kaled Allende, PA-C  levothyroxine (SYNTHROID, LEVOTHROID) 25  MCG tablet Take 1 tablet (25 mcg total) by mouth daily. 08/13/17  Yes Norvin Ohlin, PA-C  meloxicam (MOBIC) 15 MG tablet Take 1 tablet (15 mg total) by mouth daily. 08/13/17  Yes Lazaria Schaben, PA-C  nystatin (MYCOSTATIN/NYSTOP) powder Apply topically 4 (four) times daily. 07/31/16  Yes Thanos Cousineau, PA-C  traMADol (ULTRAM) 50 MG tablet Take 1 tablet (50 mg total) by mouth every 8 (eight) hours as needed. 07/28/17  Yes Saharah Sherrow, PA-C  traMADol (ULTRAM) 50 MG tablet Take 1 tablet (50 mg total) by mouth every 8 (eight) hours as needed.  07/28/17  Yes Shanae Luo, PA-C  traMADol (ULTRAM) 50 MG tablet Take 50 mg by mouth every 8 (eight) hours as needed.   Yes [provider]    Allergies  Allergen Reactions  . Aspirin Tinitus  . Tape Other (See Comments)    Blisters       Objective:  Physical Exam  Constitutional: She is oriented to person, place, and time. She appears well-developed and well-nourished. She is active and cooperative. No distress.  BP 122/74   Pulse 71   Temp 98.1 F (36.7 C) (Oral)   Resp 18   Ht 5\' 5"  (1.651 m)   Wt 210 lb 12.8 oz (95.6 kg)   SpO2 97%   BMI 35.08 kg/m   HENT:  Head: Normocephalic and atraumatic.  Right Ear: Hearing normal.  Left Ear: Hearing normal.  Eyes: Conjunctivae are normal. No scleral icterus.  Neck: Normal range of motion. Neck supple. No thyromegaly present.  Cardiovascular: Normal rate, regular rhythm and normal heart sounds.  Pulses:      Radial pulses are 2+ on the right side, and 2+ on the left side.  Pulmonary/Chest: Effort normal and breath sounds normal.  Musculoskeletal:       Left wrist: She exhibits normal range of motion, no tenderness, no bony tenderness, no swelling, no effusion, no crepitus, no deformity and no laceration.       Left hand: She exhibits normal range of motion, no tenderness, no bony tenderness, normal two-point discrimination, normal capillary refill, no deformity, no laceration and no swelling. Normal sensation noted. Normal strength noted.       Hands: Lymphadenopathy:       Head (right side): No tonsillar, no preauricular, no posterior auricular and no occipital adenopathy present.       Head (left side): No tonsillar, no preauricular, no posterior auricular and no occipital adenopathy present.    She has no cervical adenopathy.       Right: No supraclavicular adenopathy present.       Left: No supraclavicular adenopathy present.  Neurological: She is alert and oriented to person, place, and time. No sensory deficit.    Skin: Skin is warm, dry and intact. No rash noted. No cyanosis or erythema. Nails show no clubbing.  Psychiatric: She has a normal mood and affect. Her speech is normal and behavior is normal.    Wt Readings from Last 3 Encounters:  02/22/18 210 lb 12.8 oz (95.6 kg)  08/13/17 200 lb (90.7 kg)  08/06/17 199 lb 8 oz (90.5 kg)          Assessment & Plan:   Problem List Items Addressed This Visit    Diabetes mellitus type 2, controlled, without complications (HCC) - Primary    Anticipate worsening A1C, though had seen small improvement at last visit. Await results and adjust regimen. Patient indicates that insulin is not an option.  Relevant Orders   Comprehensive metabolic panel   Hemoglobin A1c   HM DIABETES EYE EXAM (Completed)   Hypothyroidism    Has been stable. Await lab results.      Relevant Orders   TSH   T4, free   Spondylolisthesis of lumbar region    Continue pain management with tramadol.      Relevant Medications   traMADol (ULTRAM) 50 MG tablet (Start on 03/24/2018)   traMADol (ULTRAM) 50 MG tablet (Start on 04/23/2018)   traMADol (ULTRAM) 50 MG tablet   Essential hypertension    Well controlled. Tolerated ARB, but caused mild hypotension.      Relevant Orders   CBC with Differential/Platelet   Comprehensive metabolic panel   Hyperlipidemia    Await labs. Adjust regimen as indicated by results. Goal LDL <70      Relevant Orders   Comprehensive metabolic panel   Lipid panel   Chronic prescription opiate use    Updated NCCSRS review, agreement, Opioid Risk Tool, UDS.      Relevant Orders   ToxASSURE Select 13 (MW), Urine    Other Visit Diagnoses    Cutaneous candidiasis       Relevant Medications   ketoconazole (NIZORAL) 2 % cream   Thumb pain, left       Resume wrist splint. May need thumb spica. If no improvement, would radiograph.       Return in about 3 months (around 05/24/2018) for re-evalaution of diabetes.   Fernande Brashelle S.  Byrant Valent, PA-C Primary Care at [facility]

## 2018-02-22 NOTE — Patient Instructions (Addendum)
Try ICE on the tender spot on the foot.  Try the wrist splint for your wrist/thumb pain.  Sounds like you need to implement a plan in the house, so that everyone is contributing to the management of the household. Perhaps there can be a sign up sheet so that someone can be responsible for each chore that needs to be done, and everyone contributes.    IF you received an x-ray today, you will receive an invoice from [facility]. Please contact [facility] at [phone] with questions or concerns regarding your invoice.   IF you received labwork today, you will receive an invoice from [facility]. Please contact [facility] at [phone] with questions or concerns regarding your invoice.   Our billing staff will not be able to assist you with questions regarding bills from these companies.  You will be contacted with the lab results as soon as they are available. The fastest way to get your results is to activate your My Chart account. Instructions are located on the last page of this paperwork. If you have not heard from us regarding the results in 2 weeks, please contact this office.

## 2018-02-22 NOTE — Assessment & Plan Note (Signed)
Anticipate worsening A1C, though had seen small improvement at last visit. Await results and adjust regimen. Patient indicates that insulin is not an option.

## 2018-02-22 NOTE — Assessment & Plan Note (Signed)
Await labs. Adjust regimen as indicated by results. Goal LDL <70 

## 2018-02-22 NOTE — Assessment & Plan Note (Signed)
Has been stable. Await lab results. 

## 2018-02-22 NOTE — Assessment & Plan Note (Signed)
Updated NCCSRS review, agreement, Opioid Risk Tool, UDS.

## 2018-02-23 LAB — COMPREHENSIVE METABOLIC PANEL
ALT: 30 IU/L (ref 0–32)
AST: 22 IU/L (ref 0–40)
Albumin/Globulin Ratio: 1.6 (ref 1.2–2.2)
Albumin: 4.6 g/dL (ref 3.5–5.5)
Alkaline Phosphatase: 95 IU/L (ref 39–117)
BILIRUBIN TOTAL: 0.3 mg/dL (ref 0.0–1.2)
BUN/Creatinine Ratio: 13 (ref 9–23)
BUN: 15 mg/dL (ref 6–24)
CHLORIDE: 106 mmol/L (ref 96–106)
CO2: 22 mmol/L (ref 20–29)
Calcium: 9.9 mg/dL (ref 8.7–10.2)
Creatinine, Ser: 1.15 mg/dL — ABNORMAL HIGH (ref 0.57–1.00)
GFR calc Af Amer: 61 mL/min/{1.73_m2} (ref >59)
GFR calc non Af Amer: 53 mL/min/{1.73_m2} — ABNORMAL LOW (ref >59)
GLUCOSE: 166 mg/dL — AB (ref 65–99)
Globulin, Total: 2.8 g/dL (ref 1.5–4.5)
Potassium: 3.9 mmol/L (ref 3.5–5.2)
Sodium: 141 mmol/L (ref 134–144)
TOTAL PROTEIN: 7.4 g/dL (ref 6.0–8.5)

## 2018-02-23 LAB — CBC WITH DIFFERENTIAL/PLATELET
BASOS ABS: 0 10*3/uL (ref 0.0–0.2)
Basos: 0 %
EOS (ABSOLUTE): 0.3 10*3/uL (ref 0.0–0.4)
Eos: 3 %
Hematocrit: 41.2 % (ref 34.0–46.6)
Hemoglobin: 13.5 g/dL (ref 11.1–15.9)
IMMATURE GRANS (ABS): 0 10*3/uL (ref 0.0–0.1)
IMMATURE GRANULOCYTES: 0 %
LYMPHS: 42 %
Lymphocytes Absolute: 3.8 10*3/uL — ABNORMAL HIGH (ref 0.7–3.1)
MCH: 31.1 pg (ref 26.6–33.0)
MCHC: 32.8 g/dL (ref 31.5–35.7)
MCV: 95 fL (ref 79–97)
Monocytes Absolute: 0.4 10*3/uL (ref 0.1–0.9)
Monocytes: 4 %
NEUTROS ABS: 4.6 10*3/uL (ref 1.4–7.0)
NEUTROS PCT: 51 %
PLATELETS: 214 10*3/uL (ref 150–379)
RBC: 4.34 x10E6/uL (ref 3.77–5.28)
RDW: 13.6 % (ref 12.3–15.4)
WBC: 9.1 10*3/uL (ref 3.4–10.8)

## 2018-02-23 LAB — LIPID PANEL
CHOL/HDL RATIO: 2.5 ratio (ref 0.0–4.4)
Cholesterol, Total: 112 mg/dL (ref 100–199)
HDL: 44 mg/dL (ref >39)
LDL CALC: 34 mg/dL (ref 0–99)
Triglycerides: 172 mg/dL — ABNORMAL HIGH (ref 0–149)
VLDL Cholesterol Cal: 34 mg/dL (ref 5–40)

## 2018-02-23 LAB — TSH: TSH: 1.87 u[IU]/mL (ref 0.450–4.500)

## 2018-02-23 LAB — HEMOGLOBIN A1C
Est. average glucose Bld gHb Est-mCnc: 232 mg/dL
HEMOGLOBIN A1C: 9.7 % — AB (ref 4.8–5.6)

## 2018-02-23 LAB — T4, FREE: Free T4: 1.1 ng/dL (ref 0.82–1.77)

## 2018-02-27 LAB — TOXASSURE SELECT 13 (MW), URINE

## 2018-03-31 LAB — HM MAMMOGRAPHY

## 2018-04-21 ENCOUNTER — Encounter: Payer: Self-pay | Admitting: *Deleted

## 2018-06-17 ENCOUNTER — Telehealth: Payer: Self-pay | Admitting: Family Medicine

## 2018-06-17 NOTE — Telephone Encounter (Signed)
Copied from CRM [id number]. Topic: Quick Communication - See Telephone Encounter >> Jun 17, 2018  5:04 PM Terisa Starraylor, Brittany L wrote: CRM for notification. See Telephone encounter for: 06/17/18.  Patient needs a prior authorization on dapagliflozin propanediol (FARXIGA) 5 MG TABS tablet. [facility]  [facility] [place] [address] [place] [id number]  Prior Duard BradyAutho # [phone]

## 2018-06-23 ENCOUNTER — Telehealth: Payer: Self-pay | Admitting: Family Medicine

## 2018-06-23 DIAGNOSIS — M545 Low back pain: Principal | ICD-10-CM

## 2018-06-23 DIAGNOSIS — G8929 Other chronic pain: Secondary | ICD-10-CM

## 2018-06-23 DIAGNOSIS — M25562 Pain in left knee: Secondary | ICD-10-CM

## 2018-06-23 NOTE — Telephone Encounter (Signed)
Jacki ConesLaurie with [facility] calling and states that she is sending over a key code via fax for Cover My Meds so that this prior auth can be done for this pts medicine Farxiga.

## 2018-06-23 NOTE — Telephone Encounter (Signed)
Copied from CRM [phone]#[id number]. Topic: Quick Communication - Rx Refill/Question >> Jun 23, 2018  2:39 PM Mcneil, Ja-Kwan wrote: Medication: meloxicam (MOBIC) 15 MG tablet  Has the patient contacted their pharmacy? yes   Preferred Pharmacy (with phone number or street name): [facility] [place] [address] [phone] (Phone) [phone] (Fax)   Agent: Please be advised that RX refills may take up to 3 business days. We ask that you follow-up with your pharmacy.

## 2018-06-24 NOTE — Telephone Encounter (Signed)
Please advise 

## 2018-06-25 NOTE — Telephone Encounter (Signed)
PA STARTED   KEY [id number] SPOKE TO PATIENT ADVISED STARTING PA PATIENT HAS BEEN ON THIS FOR 1 YEAR SHE HAS 90 DAYS LEFT ON REFILL ADVISED SHE WILL NEED TO COME IN FOR AN APPOINTMENT BEFORE THIS REFILL RUNS OUT.  PATIENT VOICED UNDERSTANDING

## 2018-06-25 NOTE — Telephone Encounter (Signed)
Dr. Creta LevinStallings  I spoke with [place], and cannot come in for an appointment until she gets back.  She is unsure if she is going to follow up here or with [name].   She was advised she is due for a diabetic follow up before her prescription runs out.   The last time mobic was filled 08/13/2017.   Please advise

## 2018-07-01 ENCOUNTER — Telehealth: Payer: Self-pay | Admitting: *Deleted

## 2018-07-01 NOTE — Telephone Encounter (Signed)
TC to Cover My Meds for update on Key [id number] New Key created with [facility] form [id number] Form finished under [email or web address] [id number] approved start date 06/01/18 end date 07/01/2019

## 2018-07-03 NOTE — Telephone Encounter (Signed)
Patient was called PA approved  Patient advised

## 2018-07-19 MED ORDER — MELOXICAM 15 MG PO TABS: 15.0000 mg | ORAL_TABLET | Freq: Every day | ORAL | 0 refills | Status: DC

## 2018-08-07 NOTE — Telephone Encounter (Signed)
Signing encounter °

## 2018-08-08 DIAGNOSIS — E1122 Type 2 diabetes mellitus with diabetic chronic kidney disease: Secondary | ICD-10-CM

## 2018-08-08 DIAGNOSIS — Z981 Arthrodesis status: Secondary | ICD-10-CM | POA: Insufficient documentation

## 2018-08-08 DIAGNOSIS — I129 Hypertensive chronic kidney disease with stage 1 through stage 4 chronic kidney disease, or unspecified chronic kidney disease: Secondary | ICD-10-CM

## 2018-08-08 HISTORY — DX: Type 2 diabetes mellitus with diabetic chronic kidney disease: E11.22

## 2018-08-08 HISTORY — DX: Type 2 diabetes mellitus with diabetic chronic kidney disease: I12.9

## 2018-08-08 HISTORY — DX: Arthrodesis status: Z98.1

## 2018-08-17 DIAGNOSIS — N183 Chronic kidney disease, stage 3 unspecified: Secondary | ICD-10-CM

## 2018-08-17 DIAGNOSIS — E1122 Type 2 diabetes mellitus with diabetic chronic kidney disease: Secondary | ICD-10-CM

## 2018-08-17 HISTORY — DX: Type 2 diabetes mellitus with diabetic chronic kidney disease: E11.22

## 2018-08-17 HISTORY — DX: Chronic kidney disease, stage 3 unspecified: N18.30

## 2018-09-09 DIAGNOSIS — M12812 Other specific arthropathies, not elsewhere classified, left shoulder: Secondary | ICD-10-CM

## 2018-09-09 DIAGNOSIS — M5412 Radiculopathy, cervical region: Secondary | ICD-10-CM

## 2018-09-09 HISTORY — DX: Other specific arthropathies, not elsewhere classified, left shoulder: M12.812

## 2018-09-09 HISTORY — DX: Radiculopathy, cervical region: M54.12

## 2018-12-13 HISTORY — PX: HEEL SPUR SURGERY: SHX665

## 2018-12-19 DIAGNOSIS — M7661 Achilles tendinitis, right leg: Secondary | ICD-10-CM

## 2018-12-19 DIAGNOSIS — M6701 Short Achilles tendon (acquired), right ankle: Secondary | ICD-10-CM

## 2018-12-19 DIAGNOSIS — M7730 Calcaneal spur, unspecified foot: Secondary | ICD-10-CM

## 2018-12-19 HISTORY — DX: Calcaneal spur, unspecified foot: M77.30

## 2018-12-19 HISTORY — DX: Achilles tendinitis, right leg: M76.61

## 2018-12-19 HISTORY — DX: Short Achilles tendon (acquired), right ankle: M67.01

## 2019-01-12 DIAGNOSIS — M7660 Achilles tendinitis, unspecified leg: Secondary | ICD-10-CM | POA: Insufficient documentation

## 2019-01-12 HISTORY — DX: Achilles tendinitis, unspecified leg: M76.60

## 2019-02-02 ENCOUNTER — Inpatient Hospital Stay (HOSPITAL_COMMUNITY): Admission: RE | Admit: 2019-02-02 | Payer: BLUE CROSS/BLUE SHIELD | Source: Ambulatory Visit

## 2019-02-10 ENCOUNTER — Inpatient Hospital Stay (HOSPITAL_COMMUNITY): Admit: 2019-02-10 | Payer: BLUE CROSS/BLUE SHIELD | Admitting: Orthopedic Surgery

## 2019-02-10 ENCOUNTER — Encounter (HOSPITAL_COMMUNITY): Payer: Self-pay

## 2019-02-10 SURGERY — ARTHROPLASTY, SHOULDER, TOTAL, REVERSE
Anesthesia: Choice | Laterality: Left

## 2019-02-23 DIAGNOSIS — M25571 Pain in right ankle and joints of right foot: Secondary | ICD-10-CM | POA: Insufficient documentation

## 2019-02-23 HISTORY — DX: Pain in right ankle and joints of right foot: M25.571

## 2019-06-02 NOTE — Pre-Procedure Instructions (Signed)
Cathy Fuller  06/02/2019     Your procedure is scheduled on Wednesday, July 29.  Report to [facility], Main Entrance or Entrance "A" at 10:45 AM                     Your surgery or procedure is scheduled for 12:45 PM   Call this number if you have problems the morning of surgery: [phone]  This is the number for the Pre- Surgical Desk.      For any other questions, please call [phone], Monday - Friday 8 AM - 4 PM.   Remember:  Do not eat  after midnight, Tuesday, July 28.  You may drink clear liquids until 9:45 AM.  Clear liquids allowed are:   Water, Juice (non-citric and without pulp), Carbonated beverages, Clear Tea, Black Coffee only, Plain Jell-O only, Gatorade and Plain Popsicles only   Drink Gatorade 2 between 9:15  and 9 :45 AM                Do not drink anything after 9:4 5 AM   Take these medicines the morning of surgery with A SIP OF WATER :             levothyroxine (SYNTHROID, LEVOTHROID)              If Needed:  traMADol (ULTRAM)             cyclobenzaprine (FLEXERIL)  How to Manage Your Diabetes Before and After Surgery >>>>>>>Do Not take  Evening dose of glipiZIDE (GLUCOTROL) the evening before surgery - 06/09/2019<<<<<<<<  Why is it important to control my blood sugar before and after surgery? . Improving blood sugar levels before and after surgery helps healing and can limit problems. . A way of improving blood sugar control is eating a healthy diet by: o  Eating less sugar and carbohydrates o  Increasing activity/exercise o  Talking with your doctor about reaching your blood sugar goals . High blood sugars (greater than 180 mg/dL) can raise your risk of infections and slow your recovery, so you will need to focus on controlling your diabetes during the weeks before surgery. . Make sure that the doctor who takes care of your diabetes knows about your planned surgery including the date and location.  How do I manage my blood sugar before  surgery? . Check your blood sugar at least 4 times a day, starting 2 days before surgery, to make sure that the level is not too high or low. o Check your blood sugar the morning of your surgery when you wake up and every 2 hours until you get to the Short Stay unit. . If your blood sugar is less than 70 mg/dL, you will need to treat for low blood sugar: o Do not take insulin. o Treat a low blood sugar (less than 70 mg/dL) with  cup of clear juice (cranberry or apple), 4 glucose tablets, OR glucose gel. Recheck blood sugar in 15 minutes after treatment (to make sure it is greater than 70 mg/dL). If your blood sugar is not greater than 70 mg/dL on recheck, call [phone] o  for further instructions. . Report your blood sugar to the short stay nurse when you get to Short Stay.        Special instructions:   [place]- Preparing For Surgery  Before surgery, you can play an important role. Because skin is not sterile, your skin needs to be  as free of germs as possible. You can reduce the number of germs on your skin by washing with CHG (chlorahexidine gluconate) Soap before surgery.  CHG is an antiseptic cleaner which kills germs and bonds with the skin to continue killing germs even after washing.    Oral Hygiene is also important to reduce your risk of infection.  Remember - BRUSH YOUR TEETH THE MORNING OF SURGERY WITH YOUR REGULAR TOOTHPASTE  Please do not use if you have an allergy to CHG or antibacterial soaps. If your skin becomes reddened/irritated stop using the CHG.  Do not shave (including legs and underarms) for at least 48 hours prior to first CHG shower. It is OK to shave your face.  Please follow these instructions carefully.   1. Shower the NIGHT BEFORE SURGERY and the MORNING OF SURGERY with CHG.   2. If you chose to wash your hair, wash your hair first as usual with your normal shampoo.  3. After you shampoo, wash your face and private area with the soap you use at  home, then rinse your hair and body thoroughly to remove the shampoo and soap.-  4. Use CHG as you would any other liquid soap. You can apply CHG directly to the skin and wash gently with a scrungie or a clean washcloth.   5. Apply the CHG Soap to your body ONLY FROM THE NECK DOWN.  Do not use on open wounds or open sores. Avoid contact with your eyes, ears, mouth and genitals (private parts)  6. Wash thoroughly, paying special attention to the area where your surgery will be performed.  7. Thoroughly rinse your body with warm water from the neck down.  8. DO NOT shower/wash with your normal soap after using and rinsing off the CHG Soap.  9. Pat yourself dry with a CLEAN TOWEL.  10. Wear CLEAN PAJAMAS to bed the night before surgery, wear comfortable clothes the morning of surgery  11. Place CLEAN SHEETS on your bed the night of your first shower and DO NOT SLEEP WITH PETS.  Day of Surgery: Shower as instructed above. Do not wear lotions, powders, or perfumes, or deodorant. Please wear clean clothes to the hospital/surgery center.   Remember to brush your teeth WITH YOUR REGULAR TOOTHPASTE.   Do not wear jewelry, make-up or nail polish  Do not shave 48 hours prior to surgery.  Men may shave face and neck.  Do not bring valuables to the hospital.  [facility] for any belongings or valuables.  Contacts, dentures or bridgework may not be worn into surgery.  Leave your suitcase in the car.  After surgery it may be brought to your room.  For patients admitted to the hospital, discharge time will be determined by your treatment team.  Patients discharged the day of surgery will not be allowed to drive home.   Please read over the following fact sheets that you were given.

## 2019-06-03 ENCOUNTER — Encounter (HOSPITAL_COMMUNITY)
Admission: RE | Admit: 2019-06-03 | Discharge: 2019-06-03 | Disposition: A | Discharge status: Home / Self Care | Payer: BLUE CROSS/BLUE SHIELD | Source: Ambulatory Visit | Attending: Orthopedic Surgery | Admitting: Orthopedic Surgery

## 2019-06-03 ENCOUNTER — Encounter (HOSPITAL_COMMUNITY): Payer: Self-pay

## 2019-06-03 ENCOUNTER — Other Ambulatory Visit: Payer: Self-pay

## 2019-06-03 DIAGNOSIS — Z01812 Encounter for preprocedural laboratory examination: Secondary | ICD-10-CM | POA: Insufficient documentation

## 2019-06-03 HISTORY — DX: Personal history of urinary calculi: Z87.442

## 2019-06-03 LAB — CBC
HCT: 41.9 % (ref 36.0–46.0)
Hemoglobin: 14.2 g/dL (ref 12.0–15.0)
MCH: 32.5 pg (ref 26.0–34.0)
MCHC: 33.9 g/dL (ref 30.0–36.0)
MCV: 95.9 fL (ref 80.0–100.0)
Platelets: 489 10*3/uL — ABNORMAL HIGH (ref 150–400)
RBC: 4.37 MIL/uL (ref 3.87–5.11)
RDW: 13.3 % (ref 11.5–15.5)
WBC: 8.3 10*3/uL (ref 4.0–10.5)
nRBC: 0 % (ref 0.0–0.2)

## 2019-06-03 LAB — SURGICAL PCR SCREEN
MRSA, PCR: NEGATIVE
Staphylococcus aureus: POSITIVE — AB

## 2019-06-03 LAB — GLUCOSE, CAPILLARY: Glucose-Capillary: 98 mg/dL (ref 70–99)

## 2019-06-03 NOTE — Progress Notes (Signed)
Received call from main lab, CMET hemolyzed, will need repeat DOS.

## 2019-06-03 NOTE — Progress Notes (Signed)
Pt positive for staph by nasal PCR.  Mupirocin ointment called into [facility].  Patient aware of results and instructed to begin using ointment--verbalized understanding.

## 2019-06-03 NOTE — Pre-Procedure Instructions (Signed)
Cathy Fuller  06/03/2019     Your procedure is scheduled on Wednesday, July 29.  Report to [facility], Main Entrance or Entrance "A" at 10:45 AM                     Your surgery or procedure is scheduled for 12:45 PM   Call this number if you have problems the morning of surgery: [phone]  This is the number for the Pre- Surgical Desk.      For any other questions, please call [phone], Monday - Friday 8 AM - 4 PM.   Remember:  Do not eat  after midnight, Tuesday, July 28.  You may drink clear liquids until 9:45 AM.  Clear liquids allowed are:   Water, Juice (non-citric and without pulp), Carbonated beverages, Clear Tea, Black Coffee only, Plain Jell-O only, Gatorade and Plain Popsicles only   Drink Gatorade 2 between 9:15  and 9 :45 AM                Do not drink anything after 9:4 5 AM   Take these medicines the morning of surgery with A SIP OF WATER :             levothyroxine (SYNTHROID, LEVOTHROID)              If Needed:  traMADol (ULTRAM)             cyclobenzaprine (FLEXERIL)  How to Manage Your Diabetes Before and After Surgery  >>>>>>>Do Not take  Evening dose of glipiZIDE (GLUCOTROL) and farxiga the evening before surgery - 06/09/2019<<<<<<<<  Why is it important to control my blood sugar before and after surgery? . Improving blood sugar levels before and after surgery helps healing and can limit problems. . A way of improving blood sugar control is eating a healthy diet by: o  Eating less sugar and carbohydrates o  Increasing activity/exercise o  Talking with your doctor about reaching your blood sugar goals . High blood sugars (greater than 180 mg/dL) can raise your risk of infections and slow your recovery, so you will need to focus on controlling your diabetes during the weeks before surgery. . Make sure that the doctor who takes care of your diabetes knows about your planned surgery including the date and location.  How do I manage my  blood sugar before surgery? . Check your blood sugar at least 4 times a day, starting 2 days before surgery, to make sure that the level is not too high or low. o Check your blood sugar the morning of your surgery when you wake up and every 2 hours until you get to the Short Stay unit. . If your blood sugar is less than 70 mg/dL, you will need to treat for low blood sugar: o Do not take insulin. o Treat a low blood sugar (less than 70 mg/dL) with  cup of clear juice (cranberry or apple), 4 glucose tablets, OR glucose gel. Recheck blood sugar in 15 minutes after treatment (to make sure it is greater than 70 mg/dL). If your blood sugar is not greater than 70 mg/dL on recheck, call [phone] o  for further instructions. . Report your blood sugar to the short stay nurse when you get to Short Stay.        Special instructions:   [place]- Preparing For Surgery  Before surgery, you can play an important role. Because skin is not sterile, your skin  needs to be as free of germs as possible. You can reduce the number of germs on your skin by washing with CHG (chlorahexidine gluconate) Soap before surgery.  CHG is an antiseptic cleaner which kills germs and bonds with the skin to continue killing germs even after washing.    Oral Hygiene is also important to reduce your risk of infection.  Remember - BRUSH YOUR TEETH THE MORNING OF SURGERY WITH YOUR REGULAR TOOTHPASTE  Please do not use if you have an allergy to CHG or antibacterial soaps. If your skin becomes reddened/irritated stop using the CHG.  Do not shave (including legs and underarms) for at least 48 hours prior to first CHG shower. It is OK to shave your face.  Please follow these instructions carefully.   1. Shower the NIGHT BEFORE SURGERY and the MORNING OF SURGERY with CHG.   2. If you chose to wash your hair, wash your hair first as usual with your normal shampoo.  3. After you shampoo, wash your face and private area with the  soap you use at home, then rinse your hair and body thoroughly to remove the shampoo and soap.-  4. Use CHG as you would any other liquid soap. You can apply CHG directly to the skin and wash gently with a scrungie or a clean washcloth.   5. Apply the CHG Soap to your body ONLY FROM THE NECK DOWN.  Do not use on open wounds or open sores. Avoid contact with your eyes, ears, mouth and genitals (private parts)  6. Wash thoroughly, paying special attention to the area where your surgery will be performed.  7. Thoroughly rinse your body with warm water from the neck down.  8. DO NOT shower/wash with your normal soap after using and rinsing off the CHG Soap.  9. Pat yourself dry with a CLEAN TOWEL.  10. Wear CLEAN PAJAMAS to bed the night before surgery, wear comfortable clothes the morning of surgery  11. Place CLEAN SHEETS on your bed the night of your first shower and DO NOT SLEEP WITH PETS.  Day of Surgery: Shower as instructed above. Do not wear lotions, powders, or perfumes, or deodorant. Please wear clean clothes to the hospital/surgery center.   Remember to brush your teeth WITH YOUR REGULAR TOOTHPASTE.   Do not wear jewelry, make-up or nail polish  Do not shave 48 hours prior to surgery.  Men may shave face and neck.  Do not bring valuables to the hospital.  [facility] is not responsible for any belongings or valuables.  Contacts, dentures or bridgework may not be worn into surgery.  Leave your suitcase in the car.  After surgery it may be brought to your room.  For patients admitted to the hospital, discharge time will be determined by your treatment team.  Patients discharged the day of surgery will not be allowed to drive home.   Please read over the following fact sheets that you were given.

## 2019-06-03 NOTE — Progress Notes (Signed)
PCP - Caroleen Hamman, PA @ [facility] on [address]. [facility] Cardiologist - na  Chest x-ray - na EKG - request--in care everywhere Stress Test - .>10 yrs ECHO - > 10 yrs.                    All Normal Cardiac Cath - > 10 yrs,  Sleep Study - 12/14 CPAP - yes  Fasting Blood Sugar - 98 Checks Blood Sugar __2x___ times a day  Blood Thinner Instructions: Aspirin Instructions:na  Anesthesia review:   Patient denies shortness of breath, fever, cough and chest pain at PAT appointment   Patient verbalized understanding of instructions that were given to them at the PAT appointment. Patient was also instructed that they will need to review over the PAT instructions again at home before surgery.

## 2019-06-04 NOTE — Anesthesia Preprocedure Evaluation (Addendum)
Anesthesia Evaluation  Patient identified by MRN, date of birth, ID band Patient awake    Reviewed: Allergy & Precautions, H&P , NPO status , Patient's Chart, lab work & pertinent test results  Airway Mallampati: II  TM Distance: >3 FB Neck ROM: Full    Dental no notable dental hx. (+) Teeth Intact, Dental Advisory Given   Pulmonary sleep apnea ,    Pulmonary exam normal breath sounds clear to auscultation       Cardiovascular hypertension,  Rhythm:Regular Rate:Normal     Neuro/Psych negative neurological ROS  negative psych ROS   GI/Hepatic Neg liver ROS, PUD, GERD  Medicated and Controlled,  Endo/Other  diabetes, Type 2, Oral Hypoglycemic AgentsHypothyroidism   Renal/GU negative Renal ROS  negative genitourinary   Musculoskeletal   Abdominal   Peds  Hematology  (+) Blood dyscrasia, anemia ,   Anesthesia Other Findings   Reproductive/Obstetrics negative OB ROS                           Anesthesia Physical Anesthesia Plan  ASA: II  Anesthesia Plan: General   Post-op Pain Management:  Regional for Post-op pain   Induction: Intravenous  PONV Risk Score and Plan: 3 and Ondansetron, Dexamethasone and Midazolam  Airway Management Planned: Oral ETT  Additional Equipment:   Intra-op Plan:   Post-operative Plan: Extubation in OR  Informed Consent: I have reviewed the patients History and Physical, chart, labs and discussed the procedure including the risks, benefits and alternatives for the proposed anesthesia with the patient or authorized representative who has indicated his/her understanding and acceptance.     Dental advisory given  Plan Discussed with: CRNA  Anesthesia Plan Comments: (Cleared by PCP 12/22/18, note in care everywhere. 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 She says she took mestinon but dc'd due to somnolence. She denies any activity limitations, does not currently see anyone specifically for MG. She does not feel it has progressed in the last 15 years. She has previously had GA without complication, most recently 01/06/16 as documented in anesthesia records.  EKG 12/22/18 (copy on pt chart): NSR. Rate 74.)     Anesthesia Quick Evaluation

## 2019-06-06 ENCOUNTER — Other Ambulatory Visit (HOSPITAL_COMMUNITY)
Admission: RE | Admit: 2019-06-06 | Discharge: 2019-06-06 | Disposition: A | Discharge status: Home / Self Care | Payer: BLUE CROSS/BLUE SHIELD | Source: Ambulatory Visit | Attending: Orthopedic Surgery | Admitting: Orthopedic Surgery

## 2019-06-06 DIAGNOSIS — Z1159 Encounter for screening for other viral diseases: Secondary | ICD-10-CM | POA: Diagnosis not present

## 2019-06-06 LAB — SARS CORONAVIRUS 2 (TAT 6-24 HRS): SARS Coronavirus 2: NEGATIVE

## 2019-06-10 ENCOUNTER — Other Ambulatory Visit: Payer: Self-pay

## 2019-06-10 ENCOUNTER — Inpatient Hospital Stay (HOSPITAL_COMMUNITY): Payer: BLUE CROSS/BLUE SHIELD | Admitting: Anesthesiology

## 2019-06-10 ENCOUNTER — Inpatient Hospital Stay (HOSPITAL_COMMUNITY): Payer: BLUE CROSS/BLUE SHIELD

## 2019-06-10 ENCOUNTER — Inpatient Hospital Stay (HOSPITAL_COMMUNITY)
Admission: RE | Admit: 2019-06-10 | Discharge: 2019-06-11 | DRG: 483 | Disposition: A | Discharge status: Home / Self Care | Payer: BLUE CROSS/BLUE SHIELD | Attending: Orthopedic Surgery | Admitting: Orthopedic Surgery

## 2019-06-10 ENCOUNTER — Encounter (HOSPITAL_COMMUNITY): Payer: Self-pay

## 2019-06-10 ENCOUNTER — Inpatient Hospital Stay (HOSPITAL_COMMUNITY): Payer: BLUE CROSS/BLUE SHIELD | Admitting: Physician Assistant

## 2019-06-10 ENCOUNTER — Encounter (HOSPITAL_COMMUNITY)
Admission: RE | Disposition: A | Discharge status: Home / Self Care | Payer: Self-pay | Source: Home / Self Care | Attending: Orthopedic Surgery

## 2019-06-10 DIAGNOSIS — E039 Hypothyroidism, unspecified: Secondary | ICD-10-CM | POA: Diagnosis present

## 2019-06-10 DIAGNOSIS — K7581 Nonalcoholic steatohepatitis (NASH): Secondary | ICD-10-CM | POA: Diagnosis present

## 2019-06-10 DIAGNOSIS — Z833 Family history of diabetes mellitus: Secondary | ICD-10-CM | POA: Diagnosis not present

## 2019-06-10 DIAGNOSIS — K219 Gastro-esophageal reflux disease without esophagitis: Secondary | ICD-10-CM | POA: Diagnosis present

## 2019-06-10 DIAGNOSIS — Z886 Allergy status to analgesic agent status: Secondary | ICD-10-CM | POA: Diagnosis not present

## 2019-06-10 DIAGNOSIS — Z791 Long term (current) use of non-steroidal anti-inflammatories (NSAID): Secondary | ICD-10-CM

## 2019-06-10 DIAGNOSIS — Z8349 Family history of other endocrine, nutritional and metabolic diseases: Secondary | ICD-10-CM | POA: Diagnosis not present

## 2019-06-10 DIAGNOSIS — Z8673 Personal history of transient ischemic attack (TIA), and cerebral infarction without residual deficits: Secondary | ICD-10-CM | POA: Diagnosis not present

## 2019-06-10 DIAGNOSIS — G4730 Sleep apnea, unspecified: Secondary | ICD-10-CM | POA: Diagnosis present

## 2019-06-10 DIAGNOSIS — Z823 Family history of stroke: Secondary | ICD-10-CM | POA: Diagnosis not present

## 2019-06-10 DIAGNOSIS — Z96612 Presence of left artificial shoulder joint: Secondary | ICD-10-CM

## 2019-06-10 DIAGNOSIS — Z800 Family history of malignant neoplasm of digestive organs: Secondary | ICD-10-CM

## 2019-06-10 DIAGNOSIS — M4316 Spondylolisthesis, lumbar region: Secondary | ICD-10-CM

## 2019-06-10 DIAGNOSIS — E785 Hyperlipidemia, unspecified: Secondary | ICD-10-CM | POA: Diagnosis present

## 2019-06-10 DIAGNOSIS — Z91048 Other nonmedicinal substance allergy status: Secondary | ICD-10-CM | POA: Diagnosis not present

## 2019-06-10 DIAGNOSIS — D62 Acute posthemorrhagic anemia: Secondary | ICD-10-CM | POA: Diagnosis not present

## 2019-06-10 DIAGNOSIS — Z1159 Encounter for screening for other viral diseases: Secondary | ICD-10-CM

## 2019-06-10 DIAGNOSIS — M19012 Primary osteoarthritis, left shoulder: Secondary | ICD-10-CM

## 2019-06-10 DIAGNOSIS — Z79890 Hormone replacement therapy (postmenopausal): Secondary | ICD-10-CM

## 2019-06-10 DIAGNOSIS — Z7984 Long term (current) use of oral hypoglycemic drugs: Secondary | ICD-10-CM

## 2019-06-10 DIAGNOSIS — Z981 Arthrodesis status: Secondary | ICD-10-CM

## 2019-06-10 DIAGNOSIS — Z8249 Family history of ischemic heart disease and other diseases of the circulatory system: Secondary | ICD-10-CM | POA: Diagnosis not present

## 2019-06-10 DIAGNOSIS — E119 Type 2 diabetes mellitus without complications: Secondary | ICD-10-CM | POA: Diagnosis present

## 2019-06-10 HISTORY — DX: Presence of left artificial shoulder joint: Z96.612

## 2019-06-10 HISTORY — PX: REVERSE SHOULDER ARTHROPLASTY: SHX5054

## 2019-06-10 HISTORY — DX: Primary osteoarthritis, left shoulder: M19.012

## 2019-06-10 LAB — GLUCOSE, CAPILLARY
Glucose-Capillary: 96 mg/dL (ref 70–99)
Glucose-Capillary: 88 mg/dL (ref 70–99)
Glucose-Capillary: 85 mg/dL (ref 70–99)
Glucose-Capillary: 149 mg/dL — ABNORMAL HIGH (ref 70–99)

## 2019-06-10 LAB — COMPREHENSIVE METABOLIC PANEL
ALT: 22 U/L (ref 0–44)
AST: 21 U/L (ref 15–41)
Albumin: 4.2 g/dL (ref 3.5–5.0)
Alkaline Phosphatase: 87 U/L (ref 38–126)
Anion gap: 9 (ref 5–15)
BUN: 15 mg/dL (ref 6–20)
CO2: 19 mmol/L — ABNORMAL LOW (ref 22–32)
Calcium: 9.2 mg/dL (ref 8.9–10.3)
Chloride: 112 mmol/L — ABNORMAL HIGH (ref 98–111)
Creatinine, Ser: 1.04 mg/dL — ABNORMAL HIGH (ref 0.44–1.00)
GFR calc Af Amer: >60 mL/min (ref >60)
GFR calc non Af Amer: 58 mL/min — ABNORMAL LOW (ref >60)
Glucose, Bld: 95 mg/dL (ref 70–99)
Potassium: 4.2 mmol/L (ref 3.5–5.1)
Sodium: 140 mmol/L (ref 135–145)
Total Bilirubin: 0.8 mg/dL (ref 0.3–1.2)
Total Protein: 7.4 g/dL (ref 6.5–8.1)

## 2019-06-10 SURGERY — ARTHROPLASTY, SHOULDER, TOTAL, REVERSE
Anesthesia: General | Laterality: Left

## 2019-06-10 MED ORDER — MELOXICAM 7.5 MG PO TABS
15.0000 mg | ORAL_TABLET | Freq: Every day | ORAL | Status: DC
  Filled 2019-06-10: qty 2

## 2019-06-10 MED ORDER — FENTANYL CITRATE (PF) 100 MCG/2ML IJ SOLN
INTRAMUSCULAR | Status: AC
  Administered 2019-06-10: 50 ug via INTRAVENOUS
  Filled 2019-06-10: qty 2

## 2019-06-10 MED ORDER — TRANEXAMIC ACID-NACL 1000-0.7 MG/100ML-% IV SOLN
1000.0000 mg | INTRAVENOUS | Status: AC
  Administered 2019-06-10: 13:00:00 1000 mg via INTRAVENOUS

## 2019-06-10 MED ORDER — MIDAZOLAM HCL 2 MG/2ML IJ SOLN
INTRAMUSCULAR | Status: AC
  Administered 2019-06-10: 12:00:00 1 mg via INTRAVENOUS
  Filled 2019-06-10: qty 2

## 2019-06-10 MED ORDER — TRAMADOL HCL 50 MG PO TABS
50.0000 mg | ORAL_TABLET | Freq: Four times a day (QID) | ORAL | Status: DC
  Administered 2019-06-10 – 2019-06-11 (×3): 50 mg via ORAL
  Filled 2019-06-10 (×3): qty 1

## 2019-06-10 MED ORDER — LACTATED RINGERS IV SOLN
INTRAVENOUS | Status: DC
  Administered 2019-06-10: 12:00:00 via INTRAVENOUS

## 2019-06-10 MED ORDER — MIDAZOLAM HCL 2 MG/2ML IJ SOLN: 2.0000 mg | Freq: Once | INTRAMUSCULAR | Status: DC

## 2019-06-10 MED ORDER — PROPOFOL 10 MG/ML IV BOLUS
INTRAVENOUS | Status: DC | PRN
  Administered 2019-06-10: 120 mg via INTRAVENOUS

## 2019-06-10 MED ORDER — CEFAZOLIN SODIUM-DEXTROSE 2-4 GM/100ML-% IV SOLN
2.0000 g | Freq: Four times a day (QID) | INTRAVENOUS | Status: AC
  Administered 2019-06-10 – 2019-06-11 (×3): 2 g via INTRAVENOUS
  Filled 2019-06-10 (×3): qty 100

## 2019-06-10 MED ORDER — DEXAMETHASONE SODIUM PHOSPHATE 10 MG/ML IJ SOLN
INTRAMUSCULAR | Status: AC
  Filled 2019-06-10: qty 1

## 2019-06-10 MED ORDER — BUPIVACAINE-EPINEPHRINE (PF) 0.5% -1:200000 IJ SOLN
INTRAMUSCULAR | Status: DC | PRN
  Administered 2019-06-10: 15 mL via PERINEURAL

## 2019-06-10 MED ORDER — MIDAZOLAM HCL 2 MG/2ML IJ SOLN
1.0000 mg | Freq: Once | INTRAMUSCULAR | Status: AC
  Administered 2019-06-10: 1 mg via INTRAVENOUS

## 2019-06-10 MED ORDER — ACETAMINOPHEN 325 MG PO TABS: 325.0000 mg | ORAL_TABLET | Freq: Four times a day (QID) | ORAL | Status: DC | PRN

## 2019-06-10 MED ORDER — FENTANYL CITRATE (PF) 100 MCG/2ML IJ SOLN
100.0000 ug | Freq: Once | INTRAMUSCULAR | Status: AC
  Administered 2019-06-10: 12:00:00 50 ug via INTRAVENOUS

## 2019-06-10 MED ORDER — HYDROMORPHONE HCL 1 MG/ML IJ SOLN: 0.2500 mg | INTRAMUSCULAR | Status: DC | PRN

## 2019-06-10 MED ORDER — ONDANSETRON HCL 4 MG/2ML IJ SOLN
INTRAMUSCULAR | Status: DC | PRN
  Administered 2019-06-10: 4 mg via INTRAVENOUS

## 2019-06-10 MED ORDER — MIDAZOLAM HCL 2 MG/2ML IJ SOLN
INTRAMUSCULAR | Status: AC
  Filled 2019-06-10: qty 2

## 2019-06-10 MED ORDER — ONDANSETRON HCL 4 MG/2ML IJ SOLN
INTRAMUSCULAR | Status: AC
  Filled 2019-06-10: qty 2

## 2019-06-10 MED ORDER — ONDANSETRON HCL 4 MG/2ML IJ SOLN: 4.0000 mg | Freq: Four times a day (QID) | INTRAMUSCULAR | Status: DC | PRN

## 2019-06-10 MED ORDER — CEFAZOLIN SODIUM-DEXTROSE 2-4 GM/100ML-% IV SOLN
INTRAVENOUS | Status: AC
  Filled 2019-06-10: qty 100

## 2019-06-10 MED ORDER — ACETAMINOPHEN 500 MG PO TABS
500.0000 mg | ORAL_TABLET | Freq: Four times a day (QID) | ORAL | Status: DC
  Administered 2019-06-10 – 2019-06-11 (×3): 500 mg via ORAL
  Filled 2019-06-10 (×3): qty 1

## 2019-06-10 MED ORDER — ROCURONIUM BROMIDE 50 MG/5ML IV SOSY
PREFILLED_SYRINGE | INTRAVENOUS | Status: DC | PRN
  Administered 2019-06-10: 50 mg via INTRAVENOUS

## 2019-06-10 MED ORDER — ACETAMINOPHEN 500 MG PO TABS
1000.0000 mg | ORAL_TABLET | Freq: Once | ORAL | Status: AC
  Administered 2019-06-10: 1000 mg via ORAL
  Filled 2019-06-10: qty 2

## 2019-06-10 MED ORDER — STERILE WATER FOR IRRIGATION IR SOLN
Status: DC | PRN
  Administered 2019-06-10: 1000 mL

## 2019-06-10 MED ORDER — CHLORHEXIDINE GLUCONATE 4 % EX LIQD: 60.0000 mL | Freq: Once | CUTANEOUS | Status: DC

## 2019-06-10 MED ORDER — PROPOFOL 10 MG/ML IV BOLUS
INTRAVENOUS | Status: AC
  Filled 2019-06-10: qty 20

## 2019-06-10 MED ORDER — FAMOTIDINE 40 MG PO TABS
40.0000 mg | ORAL_TABLET | Freq: Every day | ORAL | Status: DC
  Administered 2019-06-10: 40 mg via ORAL
  Filled 2019-06-10 (×2): qty 1

## 2019-06-10 MED ORDER — CYCLOBENZAPRINE HCL 10 MG PO TABS: 10.0000 mg | ORAL_TABLET | Freq: Three times a day (TID) | ORAL | Status: DC | PRN

## 2019-06-10 MED ORDER — DOCUSATE SODIUM 100 MG PO CAPS
100.0000 mg | ORAL_CAPSULE | Freq: Two times a day (BID) | ORAL | Status: DC
  Filled 2019-06-10: qty 1

## 2019-06-10 MED ORDER — CANAGLIFLOZIN 100 MG PO TABS
100.0000 mg | ORAL_TABLET | Freq: Every day | ORAL | Status: DC
  Administered 2019-06-11: 100 mg via ORAL
  Filled 2019-06-10: qty 1

## 2019-06-10 MED ORDER — ATORVASTATIN CALCIUM 10 MG PO TABS
20.0000 mg | ORAL_TABLET | Freq: Every day | ORAL | Status: DC
  Administered 2019-06-10: 20 mg via ORAL
  Filled 2019-06-10: qty 2

## 2019-06-10 MED ORDER — METOCLOPRAMIDE HCL 5 MG PO TABS: 5.0000 mg | ORAL_TABLET | Freq: Three times a day (TID) | ORAL | Status: DC | PRN

## 2019-06-10 MED ORDER — VANCOMYCIN HCL 1000 MG IV SOLR
INTRAVENOUS | Status: DC | PRN
  Administered 2019-06-10: 1000 mg

## 2019-06-10 MED ORDER — HYDROCODONE-ACETAMINOPHEN 7.5-325 MG PO TABS: 1.0000 | ORAL_TABLET | ORAL | Status: DC | PRN

## 2019-06-10 MED ORDER — TRANEXAMIC ACID-NACL 1000-0.7 MG/100ML-% IV SOLN
INTRAVENOUS | Status: AC
  Filled 2019-06-10: qty 100

## 2019-06-10 MED ORDER — LIDOCAINE 2% (20 MG/ML) 5 ML SYRINGE
INTRAMUSCULAR | Status: DC | PRN
  Administered 2019-06-10: 60 mg via INTRAVENOUS

## 2019-06-10 MED ORDER — MORPHINE SULFATE (PF) 2 MG/ML IV SOLN: 0.5000 mg | INTRAVENOUS | Status: DC | PRN

## 2019-06-10 MED ORDER — SUCCINYLCHOLINE CHLORIDE 20 MG/ML IJ SOLN
INTRAMUSCULAR | Status: DC | PRN
  Administered 2019-06-10: 100 mg via INTRAVENOUS

## 2019-06-10 MED ORDER — SODIUM CHLORIDE 0.9 % IR SOLN
Status: DC | PRN
  Administered 2019-06-10: 1000 mL

## 2019-06-10 MED ORDER — SODIUM CHLORIDE 0.9 % IV SOLN
INTRAVENOUS | Status: DC | PRN
  Administered 2019-06-10: 10 ug/min via INTRAVENOUS

## 2019-06-10 MED ORDER — VANCOMYCIN HCL 1000 MG IV SOLR
INTRAVENOUS | Status: AC
  Filled 2019-06-10: qty 1000

## 2019-06-10 MED ORDER — HYDROCODONE-ACETAMINOPHEN 5-325 MG PO TABS: 1.0000 | ORAL_TABLET | ORAL | Status: DC | PRN

## 2019-06-10 MED ORDER — PHENOL 1.4 % MT LIQD: 1.0000 | OROMUCOSAL | Status: DC | PRN

## 2019-06-10 MED ORDER — LEVOTHYROXINE SODIUM 25 MCG PO TABS
25.0000 ug | ORAL_TABLET | Freq: Every day | ORAL | Status: DC
  Administered 2019-06-11: 07:00:00 25 ug via ORAL
  Filled 2019-06-10: qty 1

## 2019-06-10 MED ORDER — BUPIVACAINE LIPOSOME 1.3 % IJ SUSP
INTRAMUSCULAR | Status: DC | PRN
  Administered 2019-06-10: 10 mL via PERINEURAL

## 2019-06-10 MED ORDER — FENTANYL CITRATE (PF) 250 MCG/5ML IJ SOLN
INTRAMUSCULAR | Status: AC
  Filled 2019-06-10: qty 5

## 2019-06-10 MED ORDER — SCOPOLAMINE 1 MG/3DAYS TD PT72
MEDICATED_PATCH | TRANSDERMAL | Status: AC
  Filled 2019-06-10: qty 1

## 2019-06-10 MED ORDER — LIDOCAINE 2% (20 MG/ML) 5 ML SYRINGE
INTRAMUSCULAR | Status: AC
  Filled 2019-06-10: qty 5

## 2019-06-10 MED ORDER — CEFAZOLIN SODIUM-DEXTROSE 2-4 GM/100ML-% IV SOLN
2.0000 g | INTRAVENOUS | Status: AC
  Administered 2019-06-10: 2 g via INTRAVENOUS

## 2019-06-10 MED ORDER — METOCLOPRAMIDE HCL 5 MG/ML IJ SOLN: 5.0000 mg | Freq: Three times a day (TID) | INTRAMUSCULAR | Status: DC | PRN

## 2019-06-10 MED ORDER — MENTHOL 3 MG MT LOZG: 1.0000 | LOZENGE | OROMUCOSAL | Status: DC | PRN

## 2019-06-10 MED ORDER — ONDANSETRON HCL 4 MG PO TABS: 4.0000 mg | ORAL_TABLET | Freq: Four times a day (QID) | ORAL | Status: DC | PRN

## 2019-06-10 MED ORDER — SUCCINYLCHOLINE CHLORIDE 200 MG/10ML IV SOSY
PREFILLED_SYRINGE | INTRAVENOUS | Status: AC
  Filled 2019-06-10: qty 10

## 2019-06-10 MED ORDER — ROCURONIUM BROMIDE 10 MG/ML (PF) SYRINGE
PREFILLED_SYRINGE | INTRAVENOUS | Status: AC
  Filled 2019-06-10: qty 10

## 2019-06-10 SURGICAL SUPPLY — 72 items
ALCOHOL 70% 16 OZ (MISCELLANEOUS) IMPLANT
BASEPLATE GLENOSPHERE 25 (Plate) ×2 IMPLANT
BASEPLATE GLENOSPHERE 25MM (Plate) ×1 IMPLANT
BEARING HUMERAL SHLDER 36M STD (Shoulder) ×1 IMPLANT
BIT DRILL 5/64X5 DISP (BIT) ×3 IMPLANT
BIT DRILL TWIST 2.7 (BIT) ×2 IMPLANT
BIT DRILL TWIST 2.7MM (BIT) ×1
BLADE SAG 18X100X1.27 (BLADE) ×3 IMPLANT
CLOSURE STERI-STRIP 1/2X4 (GAUZE/BANDAGES/DRESSINGS) ×1
CLOSURE WOUND 1/2 X4 (GAUZE/BANDAGES/DRESSINGS) ×1
CLSR STERI-STRIP ANTIMIC 1/2X4 (GAUZE/BANDAGES/DRESSINGS) ×2 IMPLANT
COVER SURGICAL LIGHT HANDLE (MISCELLANEOUS) ×3 IMPLANT
COVER WAND RF STERILE (DRAPES) IMPLANT
DRAPE IMP U-DRAPE 54X76 (DRAPES) ×3 IMPLANT
DRAPE INCISE IOBAN 66X45 STRL (DRAPES) ×3 IMPLANT
DRAPE ORTHO SPLIT 77X108 STRL (DRAPES) ×4
DRAPE SURG 17X23 STRL (DRAPES) ×3 IMPLANT
DRAPE SURG ORHT 6 SPLT 77X108 (DRAPES) ×2 IMPLANT
DRAPE U-SHAPE 47X51 STRL (DRAPES) ×3 IMPLANT
DRSG AQUACEL AG ADV 3.5X 4 (GAUZE/BANDAGES/DRESSINGS) ×3 IMPLANT
DRSG AQUACEL AG ADV 3.5X10 (GAUZE/BANDAGES/DRESSINGS) ×3 IMPLANT
DURAPREP 26ML APPLICATOR (WOUND CARE) ×3 IMPLANT
ELECT BLADE 4.0 EZ CLEAN MEGAD (MISCELLANEOUS) ×3
ELECT REM PT RETURN 9FT ADLT (ELECTROSURGICAL) ×3
ELECTRODE BLDE 4.0 EZ CLN MEGD (MISCELLANEOUS) ×1 IMPLANT
ELECTRODE REM PT RTRN 9FT ADLT (ELECTROSURGICAL) ×1 IMPLANT
GLENOID SPHERE 36MM CVD +3 (Orthopedic Implant) ×3 IMPLANT
GLOVE BIO SURGEON STRL SZ7.5 (GLOVE) ×3 IMPLANT
GLOVE BIOGEL PI IND STRL 8 (GLOVE) ×1 IMPLANT
GLOVE BIOGEL PI INDICATOR 8 (GLOVE) ×2
GOWN STRL REUS W/ TWL LRG LVL3 (GOWN DISPOSABLE) ×1 IMPLANT
GOWN STRL REUS W/ TWL XL LVL3 (GOWN DISPOSABLE) ×1 IMPLANT
GOWN STRL REUS W/TWL LRG LVL3 (GOWN DISPOSABLE) ×2
GOWN STRL REUS W/TWL XL LVL3 (GOWN DISPOSABLE) ×2
KIT BASIN OR (CUSTOM PROCEDURE TRAY) ×3 IMPLANT
KIT TURNOVER KIT B (KITS) ×3 IMPLANT
MANIFOLD NEPTUNE II (INSTRUMENTS) ×3 IMPLANT
NEEDLE 1/2 CIR MAYO (NEEDLE) ×3 IMPLANT
NEEDLE HYPO 25GX1X1/2 BEV (NEEDLE) ×3 IMPLANT
NS IRRIG 1000ML POUR BTL (IV SOLUTION) ×3 IMPLANT
PACK SHOULDER (CUSTOM PROCEDURE TRAY) ×3 IMPLANT
PAD ARMBOARD 7.5X6 YLW CONV (MISCELLANEOUS) ×3 IMPLANT
PIN THREADED REVERSE (PIN) ×3 IMPLANT
RESTRAINT HEAD UNIVERSAL NS (MISCELLANEOUS) ×3 IMPLANT
SCREW BONE STRL 6.5MMX25MM (Screw) ×3 IMPLANT
SCREW BONE STRL 6.5MMX30MM (Screw) ×3 IMPLANT
SCREW LOCKING 4.75MMX15MM (Screw) ×6 IMPLANT
SCREW LOCKING STRL 4.75X25X3.5 (Screw) ×6 IMPLANT
SHOULDER HUMERAL BEAR 36M STD (Shoulder) ×3 IMPLANT
SLING ARM IMMOBILIZER LRG (SOFTGOODS) IMPLANT
SLING ARM IMMOBILIZER MED (SOFTGOODS) IMPLANT
SPONGE LAP 18X18 RF (DISPOSABLE) IMPLANT
SPONGE LAP 4X18 RFD (DISPOSABLE) ×3 IMPLANT
STEM HUMERAL STRL 9MMX83MM (Stem) ×3 IMPLANT
STRIP CLOSURE SKIN 1/2X4 (GAUZE/BANDAGES/DRESSINGS) ×2 IMPLANT
SUCTION FRAZIER HANDLE 10FR (MISCELLANEOUS) ×2
SUCTION TUBE FRAZIER 10FR DISP (MISCELLANEOUS) ×1 IMPLANT
SUT FIBERWIRE #2 38 T-5 BLUE (SUTURE) ×6
SUT MAXBRAID #2 CVD NDL (SUTURE) ×3 IMPLANT
SUT MNCRL AB 4-0 PS2 18 (SUTURE) ×3 IMPLANT
SUT VIC AB 1 CT1 27 (SUTURE)
SUT VIC AB 1 CT1 27XBRD ANBCTR (SUTURE) IMPLANT
SUT VIC AB 2-0 CT1 27 (SUTURE) ×2
SUT VIC AB 2-0 CT1 TAPERPNT 27 (SUTURE) ×1 IMPLANT
SUTURE FIBERWR #2 38 T-5 BLUE (SUTURE) ×2 IMPLANT
SYR CONTROL 10ML LL (SYRINGE) IMPLANT
TOWEL GREEN STERILE (TOWEL DISPOSABLE) ×3 IMPLANT
TOWEL GREEN STERILE FF (TOWEL DISPOSABLE) ×3 IMPLANT
TOWER CARTRIDGE SMART MIX (DISPOSABLE) IMPLANT
TRAY HUM MINI SHOULDER +3 40 (Joint) ×3 IMPLANT
WATER STERILE IRR 1000ML POUR (IV SOLUTION) ×3 IMPLANT
YANKAUER SUCT BULB TIP NO VENT (SUCTIONS) ×3 IMPLANT

## 2019-06-10 NOTE — Brief Op Note (Signed)
06/10/2019  2:12 PM  PATIENT:  Cathy Fuller  58 y.o. female  PRE-OPERATIVE DIAGNOSIS:  Left shoulder rotator cuff arthropathy  POST-OPERATIVE DIAGNOSIS:  Left shoulder rotator cuff arthropathy  PROCEDURE:  Procedure(s) with comments: LEFT REVERSE SHOULDER ARTHROPLASTY (Left) - 2 hrs  SURGEON:  Surgeon(s) and Role:    * Stann Mainland, Elly Modena, MD - Primary  PHYSICIAN ASSISTANT:   ASSISTANTS: Katy Apo, RNFA   ANESTHESIA:   regional and general  EBL:  150 cc  BLOOD ADMINISTERED:none  DRAINS: none   LOCAL MEDICATIONS USED:  NONE  SPECIMEN:  No Specimen  DISPOSITION OF SPECIMEN:  N/A  COUNTS:  YES  TOURNIQUET:  * No tourniquets in log *  DICTATION: .Note written in EPIC  PLAN OF CARE: Admit to inpatient   PATIENT DISPOSITION:  PACU - hemodynamically stable.   Delay start of Pharmacological VTE agent (>24hrs) due to surgical blood loss or risk of bleeding: not applicable

## 2019-06-10 NOTE — Op Note (Addendum)
06/10/2019  2:13 PM  PATIENT:  Cathy SquibbJanelle Gonyea    PRE-OPERATIVE DIAGNOSIS:  Left shoulder rotator cuff arthropathy  POST-OPERATIVE DIAGNOSIS:  Same  PROCEDURE:  LEFT REVERSE SHOULDER ARTHROPLASTY  SURGEON:  Yolonda KidaJason Patrick Vega Stare, MD  ASSISTANT: Lenn CalKendra Holley, RNFA  ANESTHESIA:   General  ESTIMATED BLOOD LOSS: 150cc  PREOPERATIVE INDICATIONS:  Cathy Fuller is a  58 y.o. female with a diagnosis of Left shoulder rotator cuff arthropathy who failed conservative measures and elected for surgical management.  She does have a history of two previous arthroscopic rotator cuff repairs with high grade tearing of the supraspinatus and now concomitant osteoarthritis.  The risks benefits and alternatives were discussed with the patient preoperatively including but not limited to the risks of infection, bleeding, nerve injury, cardiopulmonary complications, the need for revision surgery, dislocation, brachial plexus palsy, incomplete relief of pain, among others, and the patient was willing to proceed.  OPERATIVE IMPLANTS: Biomet size 9 mini humeral stem press-fit standard with a 40 +3 mm reverse shoulder arthroplasty tray with a standard liner and a 36 +3 mm glenosphere with a 25 mm mini baseplate and 4 locking screws and one central nonlocking screw.  OPERATIVE FINDINGS:  Evidence of previous arthroscopic surgeries with moderate subdeltoid and subacromial scaring.  Previous suture anchors in the greater tuberosity, with mostly intact infraspinatus and teres minor with type II near full thickness tearing of the supraspinatus, with intact subscapularis.  The glenohumeral joint showed grade IV cartilage loss of the humeral head with large inferior goats beard osteophyte and relatively well preserved glenoid cartilage.  The long head biceps tendon was absent from the operative field.  OPERATIVE PROCEDURE: The patient was brought to the operating room and placed in the supine position. General anesthesia  was administered. IV antibiotics were given. A Foley was placed. Time out was performed. The upper extremity was prepped and draped in usual sterile fashion. The patient was in a beachchair position. Deltopectoral approach was carried out. The biceps was absent from the groove.  The cephalic vein was taken medially. The subscapularis was released off of the bone.   I then performed circumferential releases of the humerus, and then dislocated the head, and then reamed with the reamer to the above named size.  I then applied the jig, and cut the humeral head in 30 of retroversion, and then turned my attention to the glenoid.  Deep retractors were placed, and I resected the labrum, and then placed a guidepin into the center position on the glenoid, with slight inferior inclination. I then reamed over the guidepin, and this created a small metaphyseal cancellus blush inferiorly, removing just the cartilage to the subchondral bone superiorly. The base plate was selected and impacted place, and then I secured it centrally with a nonlocking screw, and I had excellent purchase both inferiorly and superiorly. I placed a short locking screws on anterior and posterior aspects.  I then turned my attention to the glenosphere, and impacted this into place, placing slight inferior offset (set on B).   The glenoid sphere was completely seated, and had engagement of the [facility] taper. I then turned my attention back to the humerus.  I sequentially broached, and then trialed, and was found to restore soft tissue tension, and it had 2 finger tightness. Therefore the above named components were selected. The shoulder felt stable throughout functional motion.   I then impacted the real prosthesis into place, as well as the real humeral tray, and reduced the shoulder. The shoulder  had excellent motion, and was stable, and I irrigated the wounds copiously.   The subscapularis was not repaired as it was felt to have undue  tension after lateralization of the glenosphere.  Prior to closure of the deltopectoral interval, I placed 1 gram of vancomycin powder.  I then irrigated the shoulder copiously once more, repaired the deltopectoral interval with maxbraid, followed by subcutaneous Vicryl for the subcu fat and 2-0 monocryl for the deep dermal layer and 3-0 subcuticular running for the skin with Steri-Strips and Aquacel dressing. The patient was awakened and returned back in stable and satisfactory condition. There no complications and She tolerated the procedure well.  All counts were correct times 2.  Disposition:  She will be admitted to the Ortho floor in a sling to the left arm.  This will be worn unless doing OT or ADLs.Marland Kitchen ok for elevation to 90 and ER to neutral.  No lifting with the left arm.  Post op Dressing will be maintained until follow up in 2 weeks.

## 2019-06-10 NOTE — Anesthesia Postprocedure Evaluation (Signed)
Anesthesia Post Note  Patient: Cathy Fuller  Procedure(s) Performed: LEFT REVERSE SHOULDER ARTHROPLASTY (Left )     Patient location during evaluation: PACU Anesthesia Type: General Level of consciousness: awake and alert Pain management: pain level controlled Vital Signs Assessment: post-procedure vital signs reviewed and stable Respiratory status: spontaneous breathing, nonlabored ventilation, respiratory function stable and patient connected to nasal cannula oxygen Cardiovascular status: blood pressure returned to baseline and stable Postop Assessment: no apparent nausea or vomiting Anesthetic complications: no    Last Vitals:  Vitals:   06/10/19 1526 06/10/19 1541  BP: 131/71 136/78  Pulse: 74 72  Resp: 19 17  Temp:    SpO2: 99% 99%    Last Pain:  Vitals:   06/10/19 1530  TempSrc:   PainSc: 0-No pain                 Asael Pann S

## 2019-06-10 NOTE — Anesthesia Procedure Notes (Signed)
Procedure Name: Intubation Date/Time: 06/10/2019 1:08 PM Performed by: Scheryl Darter, CRNA Pre-anesthesia Checklist: Patient identified, Emergency Drugs available, Suction available and Patient being monitored Patient Re-evaluated:Patient Re-evaluated prior to induction Oxygen Delivery Method: Circle System Utilized Preoxygenation: Pre-oxygenation with 100% oxygen Induction Type: IV induction Ventilation: Mask ventilation without difficulty Laryngoscope Size: Mac and 3 Grade View: Grade I Tube type: Oral Tube size: 7.0 mm Number of attempts: 1 Airway Equipment and Method: Stylet and Oral airway Placement Confirmation: ETT inserted through vocal cords under direct vision,  positive ETCO2 and breath sounds checked- equal and bilateral Secured at: 22 cm Tube secured with: Tape Dental Injury: Teeth and Oropharynx as per pre-operative assessment

## 2019-06-10 NOTE — Transfer of Care (Signed)
Immediate Anesthesia Transfer of Care Note  Patient: Cathy Fuller  Procedure(s) Performed: LEFT REVERSE SHOULDER ARTHROPLASTY (Left )  Patient Location: PACU  Anesthesia Type:General  Level of Consciousness: drowsy and patient cooperative  Airway & Oxygen Therapy: Patient Spontanous Breathing and Patient connected to nasal cannula oxygen  Post-op Assessment: Report given to RN and Post -op Vital signs reviewed and stable  Post vital signs: Reviewed and stable  Last Vitals:  Vitals Value Taken Time  BP    Temp    Pulse 83 06/10/19 1456  Resp 13 06/10/19 1456  SpO2 99 % 06/10/19 1456  Vitals shown include unvalidated device data.  Last Pain:  Vitals:   06/10/19 1200  TempSrc:   PainSc: 0-No pain         Complications: No apparent anesthesia complications

## 2019-06-10 NOTE — H&P (Signed)
ORTHOPAEDIC H and P  REQUESTING PHYSICIAN: Nicholes Stairs, MD  PCP:  Harrison Mons, PA  Chief Complaint: Left shoulder OA  HPI: Cathy Fuller is a 58 y.o. female who complains of left shoulder pain.  She has end-stage arthritis with history of previous revision rotator cuff repair.  She is presenting today for reverse shoulder arthroplasty of the left shoulder.  She has no new complaints at this time.  She is ready to proceed with the above surgery.  Past Medical History:  Diagnosis Date  . Anemia    as a teenager  . Barrett's esophagus   . Blood transfusion without reported diagnosis   . Complication of anesthesia    1978 hard time waking up after surgery and low blood pressure  . Diabetes mellitus without complication ([place])   . Esophageal stricture   . Fatty liver 2009  . GERD (gastroesophageal reflux disease)   . Hepatitis A 1983  . History of kidney stones    yrs. ago  . Hx of adenomatous colonic polyps   . Hyperlipidemia   . Hyperplastic colon polyp   . Hypertension    hasn't been on meds in 3 yrs ago  . Hypothyroidism   . IBS (irritable bowel syndrome)   . Internal hemorrhoids   . NASH (nonalcoholic steatohepatitis)    grade 1 fibrosis  . Neuromuscular disorder ([place])    MILD CASE OF MG - per patient not MS  . PUD (peptic ulcer disease)   . Sleep apnea   . Stroke-like symptoms 1978   drug induced stroke  . Thyroid disease    Past Surgical History:  Procedure Laterality Date  . ABDOMINAL HYSTERECTOMY    . ANTERIOR LAT LUMBAR FUSION Right 12/27/2015   Procedure: Right Sided Lumbar tw-three Lateral lumbar interbody fusion with lateral plate;  Surgeon: Kevan Ny Ditty, MD;  Location: [facility];  Service: Neurosurgery;  Laterality: Right;  Right Sided L2-3 Lateral lumbar interbody fusion with lateral plate  . BACK SURGERY    . BLADDER SURGERY     x 2  . BREAST BIOPSY    . CARPAL TUNNEL RELEASE Bilateral   . CHOLECYSTECTOMY    .  CHOLECYSTECTOMY, LAPAROSCOPIC  1998  . COLONOSCOPY    . HEEL SPUR SURGERY Right 12/2018   l[place]  . HERNIA REPAIR    . KNEE SURGERY Left   . LUMBAR FUSION     2017  . SHOULDER SURGERY Left    x4 surgeries  . TONSILLECTOMY     Social History   Socioeconomic History  . Marital status: Married    Spouse name: Not on file  . Number of children: 2  . Years of education: Not on file  . Highest education level: Not on file  Occupational History  . Occupation: Disabled    Comment: disabled  Social Needs  . Financial resource strain: Not on file  . Food insecurity    Worry: Not on file    Inability: Not on file  . Transportation needs    Medical: Not on file    Non-medical: Not on file  Tobacco Use  . Smoking status: Never Smoker  . Smokeless tobacco: Never Used  Substance and Sexual Activity  . Alcohol use: Yes    Alcohol/week: 1.0 - 2.0 standard drinks    Types: 1 - 2 Standard drinks or equivalent per week    Comment: rare, wine or wine cooler  . Drug use: No  .  Sexual activity: Not on file  Lifestyle  . Physical activity    Days per week: Not on file    Minutes per session: Not on file  . Stress: Not on file  Relationships  . Social Musicianconnections    Talks on phone: Not on file    Gets together: Not on file    Attends religious service: Not on file    Active member of club or organization: Not on file    Attends meetings of clubs or organizations: Not on file    Relationship status: Not on file  Other Topics Concern  . Not on file  Social History Narrative   Lives at home with husband and son.     Family History  Problem Relation Age of Onset  . Thyroid disease Mother   . Stroke Mother   . Heart disease Mother   . Hyperlipidemia Mother   . Hypertension Mother   . Stroke Father   . Heart disease Father   . Hyperlipidemia Father   . Hypertension Father   . Diabetes Brother   . Colitis Daughter   . Stroke Maternal Grandmother   . Heart  disease Maternal Grandfather        HEART ATTACK  . Heart disease Paternal Grandfather        HEART ATTACK  . Colon cancer Maternal Uncle    Allergies  Allergen Reactions  . Aspirin Tinitus  . Tape Other (See Comments)    Blisters   Prior to Admission medications   Medication Sig Start Date End Date Taking? Authorizing Provider  atorvastatin (LIPITOR) 20 MG tablet Take 1 tablet (20 mg total) by mouth daily. Patient taking differently: Take 20 mg by mouth at bedtime.  08/13/17  Yes Jeffery, [name], PA  cyclobenzaprine (FLEXERIL) 10 MG tablet Take 1 tablet (10 mg total) by mouth 3 (three) times daily as needed. 08/13/17  Yes Jeffery, Avelino Leedshelle, PA  dapagliflozin propanediol (FARXIGA) 5 MG TABS tablet Take 5 mg by mouth daily. Reported on 04/17/2016 08/13/17  Yes Jeffery, Avelino Leedshelle, PA  famotidine (PEPCID) 40 MG tablet Take 40 mg by mouth at bedtime.    Yes [provider]  glipiZIDE (GLUCOTROL) 10 MG tablet TAKE 1 TABLET THREE TIMES A DAY BEFORE MEALS Patient taking differently: Take 10 mg by mouth 3 (three) times daily before meals.  12/18/17  Yes Jeffery, [name], PA  ketoconazole (NIZORAL) 2 % cream Apply 1 application topically daily. Patient taking differently: Apply 1 application topically daily as needed for irritation.  02/22/18  Yes Jeffery, Avelino Leedshelle, PA  levothyroxine (SYNTHROID, LEVOTHROID) 25 MCG tablet Take 1 tablet (25 mcg total) by mouth daily. 08/13/17  Yes Jeffery, [name], PA  meloxicam (MOBIC) 15 MG tablet Take 1 tablet (15 mg total) by mouth daily. Patient taking differently: Take 15 mg by mouth every Monday, Wednesday, and Friday.  07/19/18  Yes Doristine BosworthStallings, Zoe A, MD  Semaglutide (OZEMPIC, 0.25 OR 0.5 MG/DOSE, [place]) Inject 0.5 mg into the skin every Monday.    Yes [provider]  traMADol (ULTRAM) 50 MG tablet Take 1 tablet (50 mg total) by mouth every 8 (eight) hours as needed. 04/23/18  Yes Jeffery, [name], PA  traMADol (ULTRAM) 50 MG tablet Take 1 tablet (50 mg total) by  mouth every 8 (eight) hours as needed. Patient not taking: Reported on 01/29/2019 03/24/18   Porfirio OarJeffery, [name], PA  traMADol (ULTRAM) 50 MG tablet Take 1 tablet (50 mg total) by mouth every 8 (eight) hours as needed. Patient  not taking: Reported on 01/29/2019 02/22/18   Porfirio OarJeffery, [name], PA  lisinopril (PRINIVIL,ZESTRIL) 10 MG tablet Take 10 mg by mouth daily.  01/30/12  [provider]   No results found.  Positive ROS: All other systems have been reviewed and were otherwise negative with the exception of those mentioned in the HPI and as above.  Physical Exam: General: Alert, no acute distress Cardiovascular: No pedal edema Respiratory: No cyanosis, no use of accessory musculature GI: No organomegaly, abdomen is soft and non-tender Skin: No lesions in the area of chief complaint Neurologic: Sensation intact distally Psychiatric: Patient is competent for consent with normal mood and affect Lymphatic: No axillary or cervical lymphadenopathy  MUSCULOSKELETAL:  Left shoulder skin previously healed incisions.  No in signs of infection.  Neurovascular intact.  Assessment: Left shoulder osteoarthritis with history of rotator cuff repair x2  Plan: -We again reviewed the plan to move forward with a reverse shoulder arthroplasty today for arthritis with high-grade rotator cuff tearing.  We discussed that given her 2 previous surgeries we would prefer the reverse replacement which will perform better in the face of the rotator cuff issues.  She is in agreement with that.  -We again discussed the risk of bleeding, infection, damage to surrounding neurovascular structures, dislocation, fracture, risk of blood clots, and the risk of anesthesia.  She has provided informed consent.  -We will plan for admission postoperatively and likely overnight stay.    Yolonda KidaJason Patrick Toluwani Ruder, MD Cell [phone]    06/10/2019 12:21 PM

## 2019-06-10 NOTE — Anesthesia Procedure Notes (Signed)
Anesthesia Regional Block: Interscalene brachial plexus block   Pre-Anesthetic Checklist: ,, timeout performed, Correct Patient, Correct Site, Correct Laterality, Correct Procedure, Correct Position, site marked, Risks and benefits discussed, pre-op evaluation,  At surgeon's request and post-op pain management  Laterality: Left  Prep: Maximum Sterile Barrier Precautions used, chloraprep       Needles:  Injection technique: Single-shot  Needle Type: Echogenic Stimulator Needle     Needle Length: 5cm  Needle Gauge: 22     Additional Needles:   Procedures:,,,, ultrasound used (permanent image in chart),,,,  Narrative:  Start time: 06/10/2019 11:57 AM End time: 06/10/2019 12:07 PM Injection made incrementally with aspirations every 5 mL. Anesthesiologist: Roderic Palau, MD  Additional Notes: 2% Lidocaine skin wheel.

## 2019-06-11 ENCOUNTER — Encounter (HOSPITAL_COMMUNITY): Payer: Self-pay | Admitting: Orthopedic Surgery

## 2019-06-11 LAB — GLUCOSE, CAPILLARY
Glucose-Capillary: 94 mg/dL (ref 70–99)
Glucose-Capillary: 118 mg/dL — ABNORMAL HIGH (ref 70–99)

## 2019-06-11 LAB — HEMOGLOBIN AND HEMATOCRIT, BLOOD
HCT: 37.1 % (ref 36.0–46.0)
Hemoglobin: 11.9 g/dL — ABNORMAL LOW (ref 12.0–15.0)

## 2019-06-11 MED ORDER — GLIPIZIDE 5 MG PO TABS
10.0000 mg | ORAL_TABLET | Freq: Two times a day (BID) | ORAL | Status: DC
  Administered 2019-06-11: 10 mg via ORAL
  Filled 2019-06-11: qty 2

## 2019-06-11 MED ORDER — TRAMADOL HCL 50 MG PO TABS: 50.0000 mg | ORAL_TABLET | Freq: Four times a day (QID) | ORAL | 0 refills | Status: AC | PRN

## 2019-06-11 NOTE — Progress Notes (Signed)
Occupational Therapy Evaluation Patient Details Name: Cathy Fuller MRN: [id number] DOB: [birth year] Today's Date: 06/11/2019    History of Present Illness Pt s/p left reverse shoulder arthroplasty.   Clinical Impression   Pt s/p above procedure. PTA, pt is independent and lives at home with husband.  Currently, she is supervision level for UB/LB bathing and min assist for UB/LB dressing. Per pt report, this is her 5th shoulder surgery so she is familiar with techniques and positioning. Therapist issued shoulder protocol handout and HEP handout. Reviewed shoulder ROM exercises and pt able to complete exercises with min assist/verbal cues.  Pt anticipates discharge home later today which is appropriate. However, if patient remains in hospital another night, therapy will continue tomorrow. Will continue to follow acutely in order to maximize safety and independence with ADLs.    Follow Up Recommendations  Follow surgeon's recommendation for DC plan and follow-up therapies;Supervision - Intermittent    Equipment Recommendations  None recommended by OT    Recommendations for Other Services       Precautions / Restrictions Precautions Precautions: Shoulder Shoulder Interventions: Shoulder sling/immobilizer;Off for dressing/bathing/exercises Precaution Booklet Issued: Yes (comment) Required Braces or Orthoses: Sling Restrictions Weight Bearing Restrictions: Yes LUE Weight Bearing: Non weight bearing      Mobility Bed Mobility Overal bed mobility: (pt up in chair)                Transfers Overall transfer level: Needs assistance Equipment used: None Transfers: Sit to/from Stand Sit to Stand: Supervision              Balance                                           ADL either performed or assessed with clinical judgement   ADL Overall ADL's : Needs assistance/impaired Eating/Feeding: Set up;Sitting   Grooming: Wash/dry  hands;Supervision/safety;Standing   Upper Body Bathing: Supervision/ safety;Sitting   Lower Body Bathing: Supervison/ safety;Sit to/from stand   Upper Body Dressing : Minimal assistance;Sitting Upper Body Dressing Details (indicate cue type and reason): min assist to don UE sling Lower Body Dressing: Minimal assistance Lower Body Dressing Details (indicate cue type and reason): min assist with socks Toilet Transfer: Supervision/safety;Ambulation;Comfort height toilet   Toileting- Clothing Manipulation and Hygiene: Supervision/safety;Sit to/from stand       Functional mobility during ADLs: Supervision/safety       Vision         Perception     Praxis      Pertinent Vitals/Pain Pain Assessment: 0-10 Pain Score: 4  Pain Location: left shoulder Pain Descriptors / Indicators: Operative site guarding;Guarding Pain Intervention(s): Limited activity within patient's tolerance;Monitored during session;Repositioned     Hand Dominance Right   Extremity/Trunk Assessment Upper Extremity Assessment Upper Extremity Assessment: LUE deficits/detail LUE Deficits / Details: limitations due to shoulder sx. Hand, wrist and elbow ROM WFL.    Lower Extremity Assessment Lower Extremity Assessment: Overall WFL for tasks assessed       Communication Communication Communication: No difficulties   Cognition Arousal/Alertness: Awake/alert Behavior During Therapy: WFL for tasks assessed/performed Overall Cognitive Status: Within Functional Limits for tasks assessed                                     General Comments  Exercises Exercises: Shoulder Shoulder Exercises Pendulum Exercise: PROM;Left;10 reps;Standing(verbal cues for technique) Shoulder Flexion: AAROM;Left;5 reps;Supine(~30 degrees) Shoulder External Rotation: AAROM;Left;10 reps;Seated(~20 degrees) Elbow Flexion: AROM;Left;10 reps;Seated Elbow Extension: AROM;Left;10 reps;Seated Wrist Flexion:  AROM;Left;10 reps;Seated Wrist Extension: AROM;Left;10 reps;Seated Digit Composite Flexion: AROM;Left;10 reps;Seated Composite Extension: AROM;Left;10 reps;Seated   Shoulder Instructions Shoulder Instructions Donning/doffing sling/immobilizer: Minimal assistance;Patient able to independently direct caregiver Correct positioning of sling/immobilizer: Minimal assistance;Patient able to independently direct caregiver Pendulum exercises (written home exercise program): Supervision/safety ROM for elbow, wrist and digits of operated UE: Independent Sling wearing schedule (on at all times/off for ADL's): Independent    Home Living Family/patient expects to be discharged to:: Private residence Living Arrangements: Spouse/significant other Available Help at Discharge: Family Type of Home: House Home Access: Stairs to enter Secretary/administratorntrance Stairs-Number of Steps: 3 Entrance Stairs-Rails: Right;Left Home Layout: One level     Bathroom Shower/Tub: Chief Strategy OfficerTub/shower unit   Bathroom Toilet: Standard     Home Equipment: Grab bars - toilet   Additional Comments: Husband will be home 24/7 initially.      Prior Functioning/Environment Level of Independence: Independent                 OT Problem List: Decreased range of motion;Decreased strength;Pain;Impaired UE functional use      OT Treatment/Interventions: Self-care/ADL training;Therapeutic exercise;DME and/or AE instruction;Therapeutic activities;Patient/family education    OT Goals(Current goals can be found in the care plan section) Acute Rehab OT Goals Patient Stated Goal: to go home OT Goal Formulation: With patient Time For Goal Achievement: 06/25/19 Potential to Achieve Goals: Good  OT Frequency: Min 3X/week   Barriers to D/C:            Co-evaluation              AM-PAC OT "6 Clicks" Daily Activity     Outcome Measure Help from another person eating meals?: A Little Help from another person taking care of personal  grooming?: None Help from another person toileting, which includes using toliet, bedpan, or urinal?: None Help from another person bathing (including washing, rinsing, drying)?: None Help from another person to put on and taking off regular upper body clothing?: A Little Help from another person to put on and taking off regular lower body clothing?: A Little 6 Click Score: 21   End of Session Equipment Utilized During Treatment: (sling) Nurse Communication: Mobility status  Activity Tolerance: Patient tolerated treatment well Patient left: in chair;with call bell/phone within reach(with NT)  OT Visit Diagnosis: Pain Pain - Right/Left: Left Pain - part of body: Shoulder                Time: [id number] OT Time Calculation (min): 44 min Charges:  OT General Charges $OT Visit: 1 Visit OT Evaluation $OT Eval Moderate Complexity: 1 Mod OT Treatments $Self Care/Home Management : 8-22 mins $Therapeutic Exercise: 8-22 mins   Cipriano MileJohnson, Opha Mcghee Elizabeth OTR/L Acute Rehabilitation Services [phone] 06/11/2019, 11:23 AM

## 2019-06-11 NOTE — Discharge Instructions (Signed)
-   maintain sling unless doing exercises as described by OT or ADLs - maintain post op dressing until follow up appointment  - apply ice to the shoudler for 20-30 minutes per hour that you are awake - for mild to moderate pain use tylenol and advil.  For breakthrough pain use oxycodone - you may shower with your dressing in place, but do NOT submerge under water.

## 2019-06-11 NOTE — Progress Notes (Signed)
Pt given discharge instructions and gone over with her. Pt verbalized understanding. All belongings gathered to be sent home.

## 2019-06-11 NOTE — Plan of Care (Signed)
°  Problem: Health Behavior/Discharge Planning: °Goal: Ability to manage health-related needs will improve °Outcome: Adequate for Discharge °  °Problem: Activity: °Goal: Risk for activity intolerance will decrease °Outcome: Adequate for Discharge °  °Problem: Pain Managment: °Goal: General experience of comfort will improve °Outcome: Adequate for Discharge °  °

## 2019-06-11 NOTE — Progress Notes (Signed)
   Subjective:  Patient reports pain as mild.  Doing well today and is complaining of some soreness in the left shoulder.  Otherwise her block has mostly worn off.  She denies shortness of breath, chest pain, nausea or vomiting.  Objective:   VITALS:   Vitals:   06/11/19 0013 06/11/19 0015 06/11/19 0601 06/11/19 0953  BP: (!) 97/55 (!) 103/56 108/67 125/69  Pulse: 82 83 73 76  Resp: 14 14 16    Temp: 98.5 F (36.9 C) 98.3 F (36.8 C) 98.1 F (36.7 C) 98.2 F (36.8 C)  TempSrc: Oral Oral Oral Oral  SpO2: 98% 97% 98% 96%  Weight:      Height:        Neurologically intact Sensation intact distally Intact pulses distally Incision: dressing C/D/I Sling in place  Lab Results  Component Value Date   WBC 8.3 06/03/2019   HGB 11.9 (L) 06/11/2019   HCT 37.1 06/11/2019   MCV 95.9 06/03/2019   PLT 489 (H) 06/03/2019   BMET    Component Value Date/Time   NA 140 06/10/2019 1055   NA 141 02/22/2018 1418   K 4.2 06/10/2019 1055   CL 112 (H) 06/10/2019 1055   CO2 19 (L) 06/10/2019 1055   GLUCOSE 95 06/10/2019 1055   BUN 15 06/10/2019 1055   BUN 15 02/22/2018 1418   CREATININE 1.04 (H) 06/10/2019 1055   CREATININE 1.10 (H) 07/31/2016 1212   CALCIUM 9.2 06/10/2019 1055   GFRNONAA 58 (L) 06/10/2019 1055   GFRNONAA 63 05/04/2014 0908   GFRAA >60 06/10/2019 1055   GFRAA 73 05/04/2014 0908     Assessment/Plan: 1 Day Post-Op   Principal Problem:   Osteoarthritis of left shoulder Active Problems:   S/P reverse total shoulder arthroplasty, left   Up with therapy -Sling at all times other than when doing exercises or activities of daily living.  Maintain postoperative dressing. -Resume all home meds today. -Morning labs are stable with expected acute blood loss anemia.  -Discharge home today.  Follow-up with me in 2 weeks in the office.   Nicholes Stairs 06/11/2019, 11:47 AM   Geralynn Rile, MD [phone]

## 2019-06-14 NOTE — Discharge Summary (Signed)
Patient ID: Cathy Fuller MRN: [id number] DOB/AGE: Mar 15, 1961 58 y.o.  Admit date: 06/10/2019 Discharge date: 06/11/2019  Primary Diagnosis: Left shoulder rotator cuff arthropathy  Admission Diagnoses:  Past Medical History:  Diagnosis Date  . Anemia    as a teenager  . Barrett's esophagus   . Blood transfusion without reported diagnosis   . Complication of anesthesia    1978 hard time waking up after surgery and low blood pressure  . Diabetes mellitus without complication ([place])   . Esophageal stricture   . Fatty liver 2009  . GERD (gastroesophageal reflux disease)   . Hepatitis A 1983  . History of kidney stones    yrs. ago  . Hx of adenomatous colonic polyps   . Hyperlipidemia   . Hyperplastic colon polyp   . Hypertension    hasn't been on meds in 3 yrs ago  . Hypothyroidism   . IBS (irritable bowel syndrome)   . Internal hemorrhoids   . NASH (nonalcoholic steatohepatitis)    grade 1 fibrosis  . Neuromuscular disorder ([place])    MILD CASE OF MG - per patient not MS  . Osteoarthritis of left shoulder 06/10/2019  . PUD (peptic ulcer disease)   . Sleep apnea   . Stroke-like symptoms 1978   drug induced stroke  . Thyroid disease    Discharge Diagnoses:   Principal Problem:   Osteoarthritis of left shoulder Active Problems:   S/P reverse total shoulder arthroplasty, left  Estimated body mass index is 32.79 kg/m as calculated from the following:   Height as of this encounter: 5' 4.5" (1.638 m).   Weight as of this encounter: 88 kg.  Procedure:  Procedure(s) (LRB): LEFT REVERSE SHOULDER ARTHROPLASTY (Left)   Consults: None  HPI: [facility] of the left.  She has failed conservative management. Laboratory Data: Admission on 06/10/2019, Discharged on 06/11/2019  Component Date Value Ref Range Status  . Sodium 06/10/2019 140  135 - 145 mmol/L Final  . Potassium 06/10/2019 4.2  3.5 - 5.1 mmol/L Final   . Chloride 06/10/2019 112* 98 - 111 mmol/L Final  . CO2 06/10/2019 19* 22 - 32 mmol/L Final  . Glucose, Bld 06/10/2019 95  70 - 99 mg/dL Final  . BUN 06/10/2019 15  6 - 20 mg/dL Final  . Creatinine, Ser 06/10/2019 1.04* 0.44 - 1.00 mg/dL Final  . Calcium 06/10/2019 9.2  8.9 - 10.3 mg/dL Final  . Total Protein 06/10/2019 7.4  6.5 - 8.1 g/dL Final  . Albumin 06/10/2019 4.2  3.5 - 5.0 g/dL Final  . AST 06/10/2019 21  15 - 41 U/L Final  . ALT 06/10/2019 22  0 - 44 U/L Final  . Alkaline Phosphatase 06/10/2019 87  38 - 126 U/L Final  . Total Bilirubin 06/10/2019 0.8  0.3 - 1.2 mg/dL Final  . GFR calc non Af Amer 06/10/2019 58* >60 mL/min Final  . GFR calc Af Amer 06/10/2019 >60  >60 mL/min Final  . Anion gap 06/10/2019 9  5 - 15 Final   Performed at [facility], [place] [address], [place], [place] [address]  . Glucose-Capillary 06/10/2019 96  70 - 99 mg/dL Final  . Comment 1 06/10/2019 Notify RN   Final  . Glucose-Capillary 06/10/2019 88  70 - 99 mg/dL Final  . Comment 1 06/10/2019 Notify RN   Final  . Comment 2 06/10/2019 Document in Chart   Final  . Glucose-Capillary 06/10/2019 85  70 - 99 mg/dL Final  . Hemoglobin 16/10/960407/30/2020 11.9* 12.0 - 15.0 g/dL Final  . HCT 54/09/811907/30/2020 37.1  36.0 - 46.0 % Final   Performed at [facility], [address] [place], [place] [address]  . Glucose-Capillary 06/10/2019 149* 70 - 99 mg/dL Final  . Glucose-Capillary 06/11/2019 94  70 - 99 mg/dL Final  . Glucose-Capillary 06/11/2019 118* 70 - 99 mg/dL Final  Hospital Outpatient Visit on 06/06/2019  Component Date Value Ref Range Status  . SARS Coronavirus 2 06/06/2019 NEGATIVE  NEGATIVE Final   Comment: (NOTE) SARS-CoV-2 target nucleic acids are NOT DETECTED. The SARS-CoV-2 RNA is generally detectable in upper and lower respiratory specimens during the acute phase of infection. Negative results do not preclude SARS-CoV-2 infection, do not rule out co-infections with other pathogens, and should  not be used as the sole basis for treatment or other patient management decisions. Negative results must be combined with clinical observations, patient history, and epidemiological information. The expected result is Negative. Fact Sheet for Patients: HairSlick.nohttps://www.fda.gov/media/138098/download Fact Sheet for Healthcare Providers: quierodirigir.comhttps://www.fda.gov/media/138095/download This test is not yet approved or cleared by the [place] FDA and  has been authorized for detection and/or diagnosis of SARS-CoV-2 by FDA under an Emergency Use Authorization (EUA). This EUA will remain  in effect (meaning this test can be used) for the duration of the COVID-19 declaration under Section 56                          4(b)(1) of the Act, 21 U.S.C. section 360bbb-3(b)(1), unless the authorization is terminated or revoked sooner. Performed at [facility], [address] [place], [place] [address]   Hospital Outpatient Visit on 06/03/2019  Component Date Value Ref Range Status  . Glucose-Capillary 06/03/2019 98  70 - 99 mg/dL Final  . MRSA, PCR 30/86/578407/22/2020 NEGATIVE  NEGATIVE Final  . Staphylococcus aureus 06/03/2019 POSITIVE* NEGATIVE Final   Comment: (NOTE) The Xpert SA Assay (FDA approved for NASAL specimens in patients 60 years of age and older), is one component of a comprehensive surveillance program. It is not intended to diagnose infection nor to guide or monitor treatment. Performed at [facility], [address] [place], [place] [address]   . WBC 06/03/2019 8.3  4.0 - 10.5 K/uL Final  . RBC 06/03/2019 4.37  3.87 - 5.11 MIL/uL Final  . Hemoglobin 06/03/2019 14.2  12.0 - 15.0 g/dL Final  . HCT 52/84/132407/22/2020 41.9  36.0 - 46.0 % Final  . MCV 06/03/2019 95.9  80.0 - 100.0 fL Final  . MCH 06/03/2019 32.5  26.0 - 34.0 pg Final  . MCHC 06/03/2019 33.9  30.0 - 36.0 g/dL Final  . RDW 40/10/272507/22/2020 13.3  11.5 - 15.5 % Final  . Platelets 06/03/2019 489* 150 - 400 K/uL Final  . nRBC  06/03/2019 0.0  0.0 - 0.2 % Final   Performed at [facility], [address] [place], [place] [address]     X-Rays:Dg Shoulder Left Port  Result Date: 06/10/2019 CLINICAL DATA:  Status post reversed total shoulder arthroplasty EXAM: LEFT SHOULDER - 1 VIEW COMPARISON:  None. FINDINGS: A reversed total shoulder arthroplasty is seen. No periprosthetic fracture or dislocation. IMPRESSION: S/p total shoulder arthroplasty without complication. Electronically Signed   By: Jonna ClarkBindu  Avutu M.D.   On: 06/10/2019 17:05    EKG: Orders placed or performed in visit on 07/12/15  . EKG 12-Lead  Hospital Course: Cathy SquibbJanelle Fuller is a 58 y.o. who was admitted to Hospital. They were brought to the operating room on 06/10/2019 and underwent Procedure(s): LEFT REVERSE SHOULDER ARTHROPLASTY.  Patient tolerated the procedure well and was later transferred to the recovery room and then to the orthopaedic floor for postoperative care.  They were given PO and IV analgesics for pain control following their surgery.  They were given 24 hours of postoperative antibiotics of  Anti-infectives (From admission, onward)   Start     Dose/Rate Route Frequency Ordered Stop   06/10/19 2200  ceFAZolin (ANCEF) IVPB 2g/100 mL premix     2 g 200 mL/hr over 30 Minutes Intravenous Every 6 hours 06/10/19 1628 06/11/19 0937   06/10/19 1408  vancomycin (VANCOCIN) powder  Status:  Discontinued       As needed 06/10/19 1409 06/10/19 1451   06/10/19 1100  ceFAZolin (ANCEF) IVPB 2g/100 mL premix     2 g 200 mL/hr over 30 Minutes Intravenous On call to O.R. 06/10/19 1055 06/10/19 1300   06/10/19 1056  ceFAZolin (ANCEF) 2-4 GM/100ML-% IVPB    Note to Pharmacy: Comer LocketKotian, Sapna   : cabinet override      06/10/19 1056 06/10/19 1300     and started on DVT prophylaxis in the form of Aspirin.  OT were ordered for total joint protocol.  Patient had a good night on the evening of surgery.   Incision was healing well.  Patient was seen in  rounds and was ready to go home.   Diet: Diabetic diet Activity:NWB Follow-up:in 2 weeks Disposition - Home Discharged Condition: good   Discharge Instructions    Call MD / Call 911   Complete by: As directed    If you experience chest pain or shortness of breath, CALL 911 and be transported to the hospital emergency room.  If you develope a fever above 101 F, pus (white drainage) or increased drainage or redness at the wound, or calf pain, call your surgeon's office.   Constipation Prevention   Complete by: As directed    Drink plenty of fluids.  Prune juice may be helpful.  You may use a stool softener, such as Colace (over the counter) 100 mg twice a day.  Use MiraLax (over the counter) for constipation as needed.   Diet - low sodium heart healthy   Complete by: As directed    Increase activity slowly as tolerated   Complete by: As directed      Allergies as of 06/11/2019      Reactions   Aspirin Tinitus   Tape Other (See Comments)   Blisters      Medication List    TAKE these medications   atorvastatin 20 MG tablet Commonly known as: LIPITOR Take 1 tablet (20 mg total) by mouth daily. What changed: when to take this   cyclobenzaprine 10 MG tablet Commonly known as: FLEXERIL Take 1 tablet (10 mg total) by mouth 3 (three) times daily as needed.   dapagliflozin propanediol 5 MG Tabs tablet Commonly known as: Farxiga Take 5 mg by mouth daily. Reported on 04/17/2016   famotidine 40 MG tablet Commonly known as: PEPCID Take 40 mg by mouth at bedtime.   glipiZIDE 10 MG tablet Commonly known as: GLUCOTROL TAKE 1 TABLET THREE TIMES A DAY BEFORE MEALS What changed: See the new instructions.   ketoconazole 2 % cream Commonly known as: NIZORAL Apply 1 application topically daily. What changed:   when to take this  reasons to take this   levothyroxine 25 MCG tablet Commonly known as: SYNTHROID Take 1 tablet (25 mcg total) by mouth daily.   meloxicam 15 MG tablet  Commonly known as: MOBIC Take 1 tablet (15 mg total) by mouth daily. What changed: when to take this   OZEMPIC (0.25 OR 0.5 MG/DOSE) Sunol Inject 0.5 mg into the skin every Monday.   traMADol 50 MG tablet Commonly known as: ULTRAM Take 1 tablet (50 mg total) by mouth every 6 (six) hours as needed for moderate pain. What changed:   when to take this  reasons to take this  Another medication with the same name was removed. Continue taking this medication, and follow the directions you see here.      Follow-up Information    Yolonda Kidaogers, Lundon Verdejo Patrick, MD In 2 weeks.   Specialty: Orthopedic Surgery Why: For wound re-check Contact information: [address] [place] [address] [phone]           Signed: Maryan RuedJason P Angeles Paolucci, MD Orthopaedic Surgery 06/14/2019, 9:29 PM

## 2019-06-15 ENCOUNTER — Encounter (HOSPITAL_COMMUNITY): Payer: Self-pay | Admitting: Orthopedic Surgery

## 2019-06-16 DIAGNOSIS — M25512 Pain in left shoulder: Secondary | ICD-10-CM | POA: Insufficient documentation

## 2020-03-31 ENCOUNTER — Ambulatory Visit (INDEPENDENT_AMBULATORY_CARE_PROVIDER_SITE_OTHER): Payer: BLUE CROSS/BLUE SHIELD | Admitting: Gastroenterology

## 2020-03-31 ENCOUNTER — Encounter: Payer: Self-pay | Admitting: Gastroenterology

## 2020-03-31 VITALS — BP 162/84 | HR 85 | Ht 64.5 in | Wt 206.0 lb

## 2020-03-31 DIAGNOSIS — R1319 Other dysphagia: Secondary | ICD-10-CM

## 2020-03-31 DIAGNOSIS — K222 Esophageal obstruction: Secondary | ICD-10-CM | POA: Diagnosis not present

## 2020-03-31 DIAGNOSIS — Z1211 Encounter for screening for malignant neoplasm of colon: Secondary | ICD-10-CM | POA: Insufficient documentation

## 2020-03-31 MED ORDER — NA SULFATE-K SULFATE-MG SULF 17.5-3.13-1.6 GM/177ML PO SOLN: 1.0000 | Freq: Once | ORAL | 0 refills | Status: AC

## 2020-03-31 NOTE — Progress Notes (Signed)
Reviewed and agree with management plans. ? ?Cathy Wayment L. Avedis Bevis, MD, MPH  ?

## 2020-03-31 NOTE — Patient Instructions (Signed)
If you are age 61 or older, your body mass index should be between 23-30. Your Body mass index is 34.81 kg/m. If this is out of the aforementioned range listed, please consider follow up with your Primary Care Provider.  If you are age 38 or younger, your body mass index should be between 19-25. Your Body mass index is 34.81 kg/m. If this is out of the aformentioned range listed, please consider follow up with your Primary Care Provider.   You have been scheduled for an endoscopy and colonoscopy. Please follow the written instructions given to you at your visit today. Please pick up your prep supplies at the pharmacy within the next 1-3 days. If you use inhalers (even only as needed), please bring them with you on the day of your procedure.  Due to recent changes in healthcare laws, you may see the results of your imaging and laboratory studies on MyChart before your provider has had a chance to review them.  We understand that in some cases there may be results that are confusing or concerning to you. Not all laboratory results come back in the same time frame and the provider may be waiting for multiple results in order to interpret others.  Please give [place] 48 hours in order for your provider to thoroughly review all the results before contacting the office for clarification of your results.

## 2020-03-31 NOTE — Progress Notes (Signed)
03/31/2020 Cathy Fuller [id number] [birth year]   HISTORY OF PRESENT ILLNESS:  This is a 59 year old female who was a patient of Dr. Regino Schultze.  Her care will be assumed by Dr. Orvan Falconer.  She has a history of GERD and esophageal stricture.  Barrett's listed in her history as well, but last EGD with biopsy did not show any evidence of Barrett's.  Last EGD was 01/2014 at which time she had a mild non-obstructing benign appearing esophageal stricture that was dilated.  Had a 2-3 cm hiatal hernia as well and irregular Z-line, but once again, biopsies did not show Barrett's.  Here today with complaints of dysphagia again.  Says that it has gradually worsened over the past year or so, more in the past few months.  Food and pills getting stuck quite regularly at this point.  No problems swallowing liquids.  Is only on pepcid 40 mg at bedtime, but feels that overall her reflux is well controlled.  Does not recall taking PPIs in the past.  Says that she was told previously that she should not take pantoprazole because of myesthenia gravis.  Her last colonoscopy was in 05/2010 at which time she had internal hemorrhoids only.  Says that she has alternating constipation and loose stools, more so constipation.  Says that bowels have been this way forever.  No rectal bleeding.  Referred here on this occasion by Dr. Everlene Other for dysphagia.   Past Medical History:  Diagnosis Date  . Anemia    as a teenager  . Barrett's esophagus   . Blood transfusion without reported diagnosis   . Complication of anesthesia    1978 hard time waking up after surgery and low blood pressure  . Diabetes mellitus without complication ([facility])   . Esophageal stricture   . Fatty liver 2009  . GERD (gastroesophageal reflux disease)   . Hepatitis A 1983  . History of kidney stones    yrs. ago  . Hx of adenomatous colonic polyps   . Hyperlipidemia   . Hyperplastic colon polyp   . Hypertension    hasn't been on meds in 3 yrs ago   . Hypothyroidism   . IBS (irritable bowel syndrome)   . Internal hemorrhoids   . NASH (nonalcoholic steatohepatitis)    grade 1 fibrosis  . Neuromuscular disorder ([facility])    MILD CASE OF MG - per patient not MS  . Osteoarthritis of left shoulder 06/10/2019  . PUD (peptic ulcer disease)   . Sleep apnea   . Stroke-like symptoms 1978   drug induced stroke  . Thyroid disease    Past Surgical History:  Procedure Laterality Date  . ABDOMINAL HYSTERECTOMY    . ANTERIOR LAT LUMBAR FUSION Right 12/27/2015   Procedure: Right Sided Lumbar tw-three Lateral lumbar interbody fusion with lateral plate;  Surgeon: Loura Halt Ditty, MD;  Location: [facility];  Service: Neurosurgery;  Laterality: Right;  Right Sided L2-3 Lateral lumbar interbody fusion with lateral plate  . BACK SURGERY     2-3 fusion   . BLADDER SURGERY     x 2  . BREAST BIOPSY    . CARPAL TUNNEL RELEASE Bilateral   . CHOLECYSTECTOMY    . CHOLECYSTECTOMY, LAPAROSCOPIC  1998  . COLONOSCOPY    . FOOT SURGERY    . HEEL SPUR SURGERY Right 12/2018   l[place] of [place]  . HERNIA REPAIR    . KNEE SURGERY Left   . LUMBAR FUSION  2017  . REVERSE SHOULDER ARTHROPLASTY Left 06/10/2019   Procedure: LEFT REVERSE SHOULDER ARTHROPLASTY;  Surgeon: Yolonda Kida, MD;  Location[facility];  Service: Orthopedics;  Laterality: Left;  2 hrs  . SHOULDER SURGERY Left    x4 surgeries  . TONSILLECTOMY      reports that she has never smoked. She has never used smokeless tobacco. She reports current alcohol use of about 1.0 - 2.0 standard drinks of alcohol per week. She reports that she does not use drugs. family history includes Colitis in her daughter; Colon cancer in her maternal uncle; Diabetes in her brother; Heart disease in her father, maternal grandfather, mother, and paternal grandfather; Hyperlipidemia in her father and mother; Hypertension in her father and mother; Stroke in her father, maternal grandmother, and mother; Thyroid  disease in her mother. Allergies  Allergen Reactions  . Aspirin Tinitus  . Tape Other (See Comments)    Blisters      Outpatient Encounter Medications as of 03/31/2020  Medication Sig  . atorvastatin (LIPITOR) 20 MG tablet Take 1 tablet (20 mg total) by mouth daily. (Patient taking differently: Take 20 mg by mouth at bedtime. )  . cyclobenzaprine (FLEXERIL) 10 MG tablet Take 1 tablet (10 mg total) by mouth 3 (three) times daily as needed.  . dapagliflozin propanediol (FARXIGA) 5 MG TABS tablet Take 5 mg by mouth daily. Reported on 04/17/2016  . famotidine (PEPCID) 40 MG tablet Take 40 mg by mouth at bedtime.   Marland Kitchen glipiZIDE (GLUCOTROL) 10 MG tablet TAKE 1 TABLET THREE TIMES A DAY BEFORE MEALS (Patient taking differently: Take 10 mg by mouth 3 (three) times daily before meals. )  . levothyroxine (SYNTHROID, LEVOTHROID) 25 MCG tablet Take 1 tablet (25 mcg total) by mouth daily.  . meloxicam (MOBIC) 15 MG tablet Take 1 tablet (15 mg total) by mouth daily. (Patient taking differently: Take 15 mg by mouth every Monday, Wednesday, and Friday. )  . Semaglutide (OZEMPIC, 0.25 OR 0.5 MG/DOSE, [place]) Inject 0.5 mg into the skin every Monday.   . traMADol (ULTRAM) 50 MG tablet Take 1 tablet (50 mg total) by mouth every 6 (six) hours as needed for moderate pain.  . [DISCONTINUED] ketoconazole (NIZORAL) 2 % cream Apply 1 application topically daily. (Patient taking differently: Apply 1 application topically daily as needed for irritation. )  . [DISCONTINUED] lisinopril (PRINIVIL,ZESTRIL) 10 MG tablet Take 10 mg by mouth daily.   No facility-administered encounter medications on file as of 03/31/2020.     REVIEW OF SYSTEMS  : All other systems reviewed and negative except where noted in the History of Present Illness.   PHYSICAL EXAM: BP (!) 162/84   Pulse 85   Ht 5' 4.5" (1.638 m)   Wt 206 lb (93.4 kg)   BMI 34.81 kg/m  General: Well developed white female in no acute distress Head: Normocephalic and  atraumatic Eyes:  Sclerae anicteric, conjunctiva pink. Ears: Normal auditory acuity Lungs: Clear throughout to auscultation; no increased WOB. Heart: Regular rate and rhythm; no M/R/G Abdomen: Soft, non-distended.  BS present.  Non-tender. Rectal:  Will be done at the time of colonoscopy. Musculoskeletal: Symmetrical with no gross deformities  Skin: No lesions on visible extremities Extremities: No edema  Neurological: Alert oriented x 4, grossly non-focal Psychological:  Alert and cooperative. Normal mood and affect  ASSESSMENT AND PLAN: *Dysphagia and history of esophageal stricture:  Last dilation in 2015.  Now with dysphagia to food and pills over the past several months.  Will plan for EGD with possible dilation with Dr. Tarri Glenn.  Only on pepcid 40 mg at bedtime. *Screening CRC:  Last colonoscopy in 05/2010 with internal hemorrhoids.  Will plan for colonoscopy as well.  **The risks, benefits, and alternatives to EGD with dilation and colonoscoy were discussed with the patient and she consents to proceed.    CC:  Harrison Mons, PA CC:  Dr. Coletta Memos

## 2020-06-29 ENCOUNTER — Other Ambulatory Visit: Payer: Self-pay | Admitting: Gastroenterology

## 2020-06-29 LAB — SARS CORONAVIRUS 2 (TAT 6-24 HRS): SARS Coronavirus 2: NEGATIVE

## 2020-06-30 ENCOUNTER — Telehealth: Payer: Self-pay | Admitting: Gastroenterology

## 2020-06-30 ENCOUNTER — Encounter: Payer: Self-pay | Admitting: Certified Registered Nurse Anesthetist

## 2020-06-30 NOTE — Telephone Encounter (Signed)
Patient called in this evening. She took first half of Suprep and had multiple episodes of vomiting, doesn't think she kept much of it down. Has not had a BM. She is scheduled for EGD / colonoscopy tomorrow. Discussed options with her. She does not think she can try to Suprep again. I recommended a miralax / gatorade prep, counseled her on how to do this, she will take some dulcolax with it as well. Hopefully this works better for her, she will try it in the next few hours. Call back with additional questions.  Clifton James

## 2020-07-01 ENCOUNTER — Encounter: Payer: Self-pay | Admitting: Gastroenterology

## 2020-07-01 ENCOUNTER — Other Ambulatory Visit: Payer: Self-pay

## 2020-07-01 ENCOUNTER — Ambulatory Visit (AMBULATORY_SURGERY_CENTER): Payer: BLUE CROSS/BLUE SHIELD | Admitting: Gastroenterology

## 2020-07-01 VITALS — BP 132/73 | HR 82 | Temp 98.0°F | Resp 17 | Ht 64.0 in | Wt 206.0 lb

## 2020-07-01 DIAGNOSIS — Z1211 Encounter for screening for malignant neoplasm of colon: Secondary | ICD-10-CM

## 2020-07-01 DIAGNOSIS — K449 Diaphragmatic hernia without obstruction or gangrene: Secondary | ICD-10-CM | POA: Diagnosis not present

## 2020-07-01 DIAGNOSIS — K200 Eosinophilic esophagitis: Secondary | ICD-10-CM

## 2020-07-01 DIAGNOSIS — Z538 Procedure and treatment not carried out for other reasons: Secondary | ICD-10-CM

## 2020-07-01 DIAGNOSIS — K228 Other specified diseases of esophagus: Secondary | ICD-10-CM | POA: Diagnosis not present

## 2020-07-01 DIAGNOSIS — R1319 Other dysphagia: Secondary | ICD-10-CM | POA: Diagnosis not present

## 2020-07-01 DIAGNOSIS — K2970 Gastritis, unspecified, without bleeding: Secondary | ICD-10-CM

## 2020-07-01 DIAGNOSIS — K222 Esophageal obstruction: Secondary | ICD-10-CM

## 2020-07-01 DIAGNOSIS — K2951 Unspecified chronic gastritis with bleeding: Secondary | ICD-10-CM

## 2020-07-01 MED ORDER — SODIUM CHLORIDE 0.9 % IV SOLN: 500.0000 mL | Freq: Once | INTRAVENOUS | Status: DC

## 2020-07-01 NOTE — Op Note (Signed)
[facility] Patient Name: Cathy Fuller Procedure Date: 07/01/2020 9:17 AM MRN: [id number] Endoscopist: Tressia Danas MD, MD Age: 59 Referring MD:  Date of Birth: [birth year] Gender: Female Account #: [id number] Procedure:                Upper GI endoscopy Indications:              Dysphagia                           Prior endoscopy with Dr. Juanda Chance                           History of Barrett's Esophagus on biopsies in 2000                            and 2003, no Barrett's Esophaugs on biopsies in                            2011 or 2015                           History of Esophageal dilation in 2015 Medicines:                Monitored Anesthesia Care Procedure:                Pre-Anesthesia Assessment:                           - Prior to the procedure, a History and Physical                            was performed, and patient medications and                            allergies were reviewed. The patient's tolerance of                            previous anesthesia was also reviewed. The risks                            and benefits of the procedure and the sedation                            options and risks were discussed with the patient.                            All questions were answered, and informed consent                            was obtained. Prior Anticoagulants: The patient has                            taken no previous anticoagulant or antiplatelet  agents. ASA Grade Assessment: III - A patient with                            severe systemic disease. After reviewing the risks                            and benefits, the patient was deemed in                            satisfactory condition to undergo the procedure.                           After obtaining informed consent, the endoscope was                            passed under direct vision. Throughout the                            procedure, the patient's blood  pressure, pulse, and                            oxygen saturations were monitored continuously. The                            Endoscope was introduced through the mouth, and                            advanced to the third part of duodenum. The upper                            GI endoscopy was accomplished without difficulty.                            The patient tolerated the procedure well. Scope In: Scope Out: Findings:                 The examined esophagus was normal except for an                            irregular z-line. A TTS dilator was passed through                            the scope. Dilation with a 16-17-18 mm balloon                            dilator was performed to 18 mm. The dilation site                            was examined and showed no change. There was no                            resistance to a fully inflated esophagus Estimated  blood loss: none. Biopsies were obtained from the                            proximal and distal esophagus with cold forceps for                            histology.                           Scattered minimal inflammation characterized by                            erythema, friability and granularity was found in                            the gastric body. Biopsies were taken from the                            antrum, body, and fundus with a cold forceps for                            histology. Estimated blood loss was minimal.                           A 2 cm hiatal hernia was present. No Cameron's                            ulcers seen.                           The examined duodenum was normal. Complications:            No immediate complications. Estimated blood loss:                            Minimal. Estimated Blood Loss:     Estimated blood loss was minimal. Impression:               - Normal esophagus. Dilated. Biopsied.                           - Gastritis. Biopsied.                            - 2 cm hiatal hernia.                           - Normal examined duodenum. Recommendation:           - Patient has a contact number available for                            emergencies. The signs and symptoms of potential                            delayed complications were discussed with the  patient. Return to normal activities tomorrow.                            Written discharge instructions were provided to the                            patient.                           - Resume previous diet.                           - Continue present medications.                           - Await pathology results.                           - Consider barium esophagram if esophageal biopsies                            are normal and symptoms persist despite dilation. Tressia DanasKimberly Laurita Peron MD, MD 07/01/2020 9:47:30 AM This report has been signed electronically.

## 2020-07-01 NOTE — Progress Notes (Signed)
RN offered to schedule repeat colonoscopy at next available opening.  Patient stated she "need time to figure it out".  Patient agrees to call office to reschedule.

## 2020-07-01 NOTE — Telephone Encounter (Signed)
Thanks for your help.

## 2020-07-01 NOTE — Progress Notes (Signed)
Pt's states no medical or surgical changes since previsit or office visit.  VS CW  

## 2020-07-01 NOTE — Progress Notes (Signed)
Report given to PACU, vss 

## 2020-07-01 NOTE — Patient Instructions (Signed)
HANDOUTS PROVIDED ON: GASTRITIS & HIATAL HERNIA  The biopsies taken today have been sent for pathology.  The results can take 1-3 weeks to receive.    You may resume your previous diet and medication schedule.  Thank you for allowing [place] to care for you today!!!   YOU HAD AN ENDOSCOPIC PROCEDURE TODAY AT THE [facility]:   Refer to the procedure report that was given to you for any specific questions about what was found during the examination.  If the procedure report does not answer your questions, please call your gastroenterologist to clarify.  If you requested that your care partner not be given the details of your procedure findings, then the procedure report has been included in a sealed envelope for you to review at your convenience later.  YOU SHOULD EXPECT: Some feelings of bloating in the abdomen. Passage of more gas than usual.  Walking can help get rid of the air that was put into your GI tract during the procedure and reduce the bloating. If you had a lower endoscopy (such as a colonoscopy or flexible sigmoidoscopy) you may notice spotting of blood in your stool or on the toilet paper. If you underwent a bowel prep for your procedure, you may not have a normal bowel movement for a few days.  Please Note:  You might notice some irritation and congestion in your nose or some drainage.  This is from the oxygen used during your procedure.  There is no need for concern and it should clear up in a day or so.  SYMPTOMS TO REPORT IMMEDIATELY:   Following lower endoscopy (colonoscopy or flexible sigmoidoscopy):  Excessive amounts of blood in the stool  Significant tenderness or worsening of abdominal pains  Swelling of the abdomen that is new, acute  Fever of 100F or higher   Following upper endoscopy (EGD)  Vomiting of blood or coffee ground material  New chest pain or pain under the shoulder blades  Painful or persistently difficult swallowing  New shortness of  breath  Fever of 100F or higher  Black, tarry-looking stools  For urgent or emergent issues, a gastroenterologist can be reached at any hour by calling [phone]. Do not use MyChart messaging for urgent concerns.    DIET:  We do recommend a small meal at first, but then you may proceed to your regular diet.  Drink plenty of fluids but you should avoid alcoholic beverages for 24 hours.  ACTIVITY:  You should plan to take it easy for the rest of today and you should NOT DRIVE or use heavy machinery until tomorrow (because of the sedation medicines used during the test).    FOLLOW UP: Our staff will call the number listed on your records 48-72 hours following your procedure to check on you and address any questions or concerns that you may have regarding the information given to you following your procedure. If we do not reach you, we will leave a message.  We will attempt to reach you two times.  During this call, we will ask if you have developed any symptoms of COVID 19. If you develop any symptoms (ie: fever, flu-like symptoms, shortness of breath, cough etc.) before then, please call [phone].  If you test positive for Covid 19 in the 2 weeks post procedure, please call and report this information to [place].    If any biopsies were taken you will be contacted by phone or by letter within the next 1-3 weeks.  Please call us at [phone] if you have not heard about the biopsies in 3 weeks.    SIGNATURES/CONFIDENTIALITY: You and/or your care partner have signed paperwork which will be entered into your electronic medical record.  These signatures attest to the fact that that the information above on your After Visit Summary has been reviewed and is understood.  Full responsibility of the confidentiality of this discharge information lies with you and/or your care-partner.

## 2020-07-01 NOTE — Op Note (Signed)
[facility] Patient Name: Cathy Fuller Procedure Date: 07/01/2020 9:17 AM MRN: [id number] Endoscopist: Tressia Danas MD, MD Age: 59 Referring MD:  Date of Birth: Apr 17, 1961 Gender: Female Account #: [id number] Procedure:                Colonoscopy Indications:              Surveillance: Personal history of adenomatous                            polyps on last colonoscopy > 5 years ago Medicines:                Monitored Anesthesia Care Procedure:                Pre-Anesthesia Assessment:                           - Prior to the procedure, a History and Physical                            was performed, and patient medications and                            allergies were reviewed. The patient's tolerance of                            previous anesthesia was also reviewed. The risks                            and benefits of the procedure and the sedation                            options and risks were discussed with the patient.                            All questions were answered, and informed consent                            was obtained. Prior Anticoagulants: The patient has                            taken no previous anticoagulant or antiplatelet                            agents. ASA Grade Assessment: III - A patient with                            severe systemic disease. After reviewing the risks                            and benefits, the patient was deemed in                            satisfactory condition to undergo the procedure.  After obtaining informed consent, the colonoscope                            was passed under direct vision. Throughout the                            procedure, the patient's blood pressure, pulse, and                            oxygen saturations were monitored continuously. The                            Colonoscope was introduced through the anus with                            the intention of  advancing to the ileum. The scope                            was advanced to the sigmoid colon before the                            procedure was aborted. Medications were given. The                            colonoscopy was performed with difficulty due to                            inadequate bowel prep. The patient tolerated the                            procedure well. The quality of the bowel                            preparation was inadequate. Scope In: 9:35:21 AM Scope Out: 9:38:12 AM Total Procedure Duration: 0 hours 2 minutes 51 seconds  Findings:                 The perianal and digital rectal examinations were                            normal.                           Liquid semi-solid solid stool was found in the                            rectum, precluding visualization. Complications:            No immediate complications. Estimated Blood Loss:     Estimated blood loss: none. Impression:               - Preparation of the colon was inadequate. No                            meaningful evaluation could be performed today.                           -  No specimens collected. Recommendation:           - Patient has a contact number available for                            emergencies. The signs and symptoms of potential                            delayed complications were discussed with the                            patient. Return to normal activities tomorrow.                            Written discharge instructions were provided to the                            patient.                           - Resume previous diet.                           - Continue present medications.                           - Repeat colonoscopy at the next available                            appointment because the bowel preparation was poor.                            Plan two day prep at that time.                           - Emerging evidence supports eating a diet of                             fruits, vegetables, grains, calcium, and yogurt                            while reducing red meat and alcohol may reduce the                            risk of colon cancer.                           - Thank you for allowing me to be involved in your                            colon cancer prevention. Tressia Danas MD, MD 07/01/2020 9:52:28 AM This report has been signed electronically.

## 2020-07-01 NOTE — Progress Notes (Signed)
Robinul 0.1 mg IV given due large amount of secretions upon assessment.  MD made aware, vss 

## 2020-07-01 NOTE — Progress Notes (Signed)
Called to room to assist during endoscopic procedure.  Patient ID and intended procedure confirmed with present staff. Received instructions for my participation in the procedure from the performing physician.  

## 2020-07-05 ENCOUNTER — Telehealth: Payer: Self-pay

## 2020-07-05 ENCOUNTER — Other Ambulatory Visit: Payer: Self-pay

## 2020-07-05 MED ORDER — PANTOPRAZOLE SODIUM 40 MG PO TBEC: 40.0000 mg | DELAYED_RELEASE_TABLET | Freq: Two times a day (BID) | ORAL | 3 refills | Status: DC

## 2020-07-05 NOTE — Telephone Encounter (Signed)
First attempt follow up call to pt, lm on vm 

## 2020-07-05 NOTE — Telephone Encounter (Signed)
°  Follow up Call-  Call back number 07/01/2020  Post procedure Call Back phone  # [phone]  Permission to leave phone message Yes  Some recent data might be hidden     Patient questions:  Do you have a fever, pain , or abdominal swelling? No. Pain Score  0 *  Have you tolerated food without any problems? yes  Have you been able to return to your normal activities? Yes.    Do you have any questions about your discharge instructions: Diet   No. Medications  No. Follow up visit  No.  Do you have questions or concerns about your Care? No.  Actions: * If pain score is 4 or above: No action needed, pain <4.  1. Have you developed a fever since your procedure? no  2.   Have you had an respiratory symptoms (SOB or cough) since your procedure? no  3.   Have you tested positive for COVID 19 since your procedure no  4.   Have you had any family members/close contacts diagnosed with the COVID 19 since your procedure?  no   If yes to any of these questions please route to Laverna Peace, RN and Karlton Lemon, RN

## 2020-08-04 DIAGNOSIS — K2100 Gastro-esophageal reflux disease with esophagitis, without bleeding: Secondary | ICD-10-CM

## 2020-08-04 DIAGNOSIS — M5416 Radiculopathy, lumbar region: Secondary | ICD-10-CM

## 2020-08-04 DIAGNOSIS — K200 Eosinophilic esophagitis: Secondary | ICD-10-CM

## 2020-08-04 DIAGNOSIS — K449 Diaphragmatic hernia without obstruction or gangrene: Secondary | ICD-10-CM

## 2020-08-04 HISTORY — DX: Gastro-esophageal reflux disease with esophagitis, without bleeding: K21.00

## 2020-08-04 HISTORY — DX: Eosinophilic esophagitis: K20.0

## 2020-08-04 HISTORY — DX: Diaphragmatic hernia without obstruction or gangrene: K44.9

## 2020-08-04 HISTORY — DX: Radiculopathy, lumbar region: M54.16

## 2020-08-31 ENCOUNTER — Ambulatory Visit: Payer: BLUE CROSS/BLUE SHIELD | Admitting: Gastroenterology

## 2020-12-23 ENCOUNTER — Encounter: Payer: Self-pay | Admitting: Gastroenterology

## 2021-01-26 ENCOUNTER — Encounter: Payer: Self-pay | Admitting: Psychology

## 2021-01-26 ENCOUNTER — Encounter: Payer: Self-pay | Admitting: Neurology

## 2021-01-26 ENCOUNTER — Ambulatory Visit (INDEPENDENT_AMBULATORY_CARE_PROVIDER_SITE_OTHER): Payer: BLUE CROSS/BLUE SHIELD | Admitting: Neurology

## 2021-01-26 VITALS — BP 132/82 | HR 99 | Ht 64.5 in | Wt 189.3 lb

## 2021-01-26 DIAGNOSIS — Z818 Family history of other mental and behavioral disorders: Secondary | ICD-10-CM

## 2021-01-26 DIAGNOSIS — G4733 Obstructive sleep apnea (adult) (pediatric): Secondary | ICD-10-CM | POA: Diagnosis not present

## 2021-01-26 DIAGNOSIS — R419 Unspecified symptoms and signs involving cognitive functions and awareness: Secondary | ICD-10-CM | POA: Diagnosis not present

## 2021-01-26 NOTE — Patient Instructions (Addendum)
You have complaints of memory loss: memory loss or changes in cognitive function can have many reasons and does not always mean you have dementia. Conditions that can contribute to subjective or objective memory loss include: depression, stress, poor sleep from insomnia or sleep apnea, dehydration, fluctuation in blood sugar values, thyroid or electrolyte dysfunction and certain vitamin deficiencies. Dementia can be caused by stroke, brain atherosclerosis or brain vascular disease due to vascular risk factors (smoking, high blood pressure, high cholesterol, obesity and uncontrolled diabetes), certain degenerative brain disorders (including Parkinson's disease and Multiple sclerosis) and by Alzheimer's disease or other, more rare and sometimes hereditary causes. We will do some additional testing: blood work (most of which has been done recently already) and we will do a brain scan. We will not start medication as yet. We will also request a formal cognitive test called neuropsychological evaluation which is done by a licensed neuropsychologist. We will make a referral in that regard. We will call you with brain scan test results and monitor your symptoms. Your memory loss is rather mild at this point, which, of course is reassuring.  We will also repeat your sleep study to reevaluate your sleep apnea.  We will consider a new CPAP or AutoPap machine based on your test results.

## 2021-01-26 NOTE — Progress Notes (Signed)
Subjective:    Patient ID: Cathy Fuller is a 60 y.o. female.  HPI     Star Age, MD, PhD [facility] [address] [place], [place] [address]  Dear Dr. Coletta Memos,   I saw your patient, Cathy Fuller, upon your kind request, in my Neurologic clinic today for initial consultation of her memory loss.  The patient is unaccompanied today.  As you know, [name] is a 60 year old right-handed woman with an underlying medical history of hypertension, hyperlipidemia, hypothyroidism, chronic kidney disease, obstructive sleep apnea, and obesity, who reports short-term memory issues for the past couple of years.  She feels that in the past 3 to 4 months or short-term memory has become worse.  She reports being forgetful and misplaces things.  She has had more difficulty with word finding and has lost her train of thought easily.  She is worried about dementia because of her family history.  Mom had dementia symptoms which started in her mid to late 36s, mom is 96 years old.  Her father recently passed away at age 71 and he had dementia.  He was on 2 different medications, mom is not on any medication.  Patient has 3 brothers, none with memory issues as far she knows but she does not have contact with 2 of her brothers who live out of state.  1 brother has heart disease and vision issues as well as diabetes.   She reports that she recently had a beta amyloid test done and was one-point away from being abnormal.  I do not have any records available for my review as far as this test result goes.    I reviewed office records, she saw Harrison Mons, [place], on 08/11/2020.  She had blood work at the time. She had more recent blood work on 12/08/2020 and I reviewed the results, CBC was benign, hemoglobin A1c elevated at 9.8, CMP showed BUN of 14, creatinine 0.9, AST 17, ALT 28, lipid profile showed total cholesterol of 173, triglycerides 358, LDL 75, HDL 41, TSH was 1.39,  free T4 1.16. I had previously evaluated her for obstructive sleep apnea.  She had a baseline sleep study in 2015 which indicated an AHI of 6.7/h but O2 nadir was 76% during REM sleep.  She had a subsequent CPAP titration study and was started on CPAP therapy.  She reports that her CPAP machine has not been working.  She quit using it about 2 or 2-1/2 years ago.  She does recall benefiting from treatment.  She felt like she had more energy and was less tired during the day.  She would be willing to get rechecked for sleep apnea. She tries to hydrate well with water.  She drinks caffeine in the form of coffee, 1 cup/day and 1 serving of sweet tea.  She is a non-smoker and drinks alcohol rarely.  She lives with her husband.  She has grown children, 1 son, 1 daughter. Her family has voiced concern about her memory loss.  She has no difficulty driving.  She reports that she uses her GPS more often than not.  Previously:   02/23/14: 60 year old right-handed woman with an underlying medical history of hypertension, migraine headaches (since age 27, improved with time), hypothyroidism, obesity, type 2 diabetes, shoulder surgeries, and chronic low back pain on narcotic pain medication (prn norco, not on a daily basis), s/p back surgery, who presents for followup consultation of her obstructive sleep apnea. The patient is unaccompanied  today. I first met her on 10/29/2013, at which time she reported a history of sleep apnea and complained of snoring as well as witnessed apneas. She had middle of the night and morning headaches. She also reported a weight gain in the realm of 40 pounds since her sleep studies in 2001. I advised her to come back for repeat sleep study testing. She had a baseline sleep study on 10/31/2013 as well as a CPAP titration study on 12/08/2013. I went over her test results with her in detail today. Her baseline sleep study showed a sleep efficiency at 87.5% with a latency to sleep of 8.5  minutes and wake after sleep onset of 44 minutes with moderate to severe sleep fragmentation. She had an elevated arousal index. She had a increased percentage of slow-wave sleep and a normal percentage of REM sleep with a prolonged REM latency. She had mild periodic leg movements with significant arousals at 11.4 per hour. Her total AHI was 6.7 per hour. This increased to 25.2 per hour in REM sleep. Baseline oxygen saturation was only 90% with a nadir of 76% in REM sleep. Time below 88% saturation was 24 minutes and 35 seconds. Her CPAP titration study from 12/08/2013 showed a sleep efficiency of 85.1% with a latency to sleep of 16 minutes and wake after sleep onset of 48 minutes with mild to moderate sleep fragmentation noted. She had a normal arousal index. She had an increased percentage of slow-wave sleep and a mildly decreased percentage of REM sleep with a prolonged REM latency. She had no clinically significant periodic leg movements. Snoring was eliminated. She was titrated on CPAP from 5 to 7 cm with a reduction of the AHI to 0 per hour at the final pressure. Average oxygen saturation was 93% with a nadir of 87%. Time below 88% saturation was 19 seconds only. Supine REM sleep was achieved on the final pressure. Post study the patient reported that she slept the same as usual and that she found CPAP easy-to-use. Based on her test results I prescribed CPAP for her. I reviewed the patient's CPAP compliance data from 12/25/2013 to 01/23/2014, which is a total of 30 days, during which time the patient used CPAP every day except for 2 days. The average usage for all days was 6 hours and 10 minutes. The percent used days greater than 4 hours was 93 %, indicating very good compliance. The residual AHI was 0.8 per hour, indicating an appropriate treatment pressure of 7 cwp with EPR of 3.   I reviewed her compliance data from 12/24/2013 through 02/22/2014 which is the last 61 days during which time she is CPAP  every night except for 3 nights. Percent used days greater than 4 hours was 95%. Average usage was 6 hours and 31 minutes, residual AHI low at 0.7 per hour and leg was very low, pressure at 7 with EPR of 3.   She reports that her EDS is better and that her sleep is not as disrupted, more consolidated and better quality. She has had issues with nasal soreness with the nasal pillows and alternates a nasal mask with nasal pillows.    She was diagnosed with obstructive sleep apnea in 2001. However, for some reason she never received treatment for this. She had 2 sleep studies, in July 2001 and September 2001. Her husband works second shift and they go to bed late, around 2 AM.   Her Past Medical History Is Significant For: Past Medical History:  Diagnosis Date  . Anemia    as a teenager  . Barrett's esophagus   . Blood transfusion without reported diagnosis   . Cataract    bilateral cateracts  . Complication of anesthesia    1978 hard time waking up after surgery and low blood pressure  . Diabetes mellitus without complication ([place])   . Esophageal stricture   . Fatty liver 2009  . GERD (gastroesophageal reflux disease)   . Hepatitis A 1983  . History of kidney stones    yrs. ago  . Hx of adenomatous colonic polyps   . Hyperlipidemia   . Hyperplastic colon polyp   . Hypertension    hasn't been on meds in 3 yrs ago  . IBS (irritable bowel syndrome)   . Internal hemorrhoids   . NASH (nonalcoholic steatohepatitis)    grade 1 fibrosis  . Neuromuscular disorder ([place])    MILD CASE OF MG - per patient not MS  . Osteoarthritis of left shoulder 06/10/2019  . PUD (peptic ulcer disease)   . Sleep apnea    Cpap  . Stroke [facility])    Drug induced stroke at 48    . Stroke-like symptoms 1978   drug induced stroke  . Thyroid disease     Her Past Surgical History Is Significant For: Past Surgical History:  Procedure Laterality Date  . ABDOMINAL HYSTERECTOMY    . ANTERIOR LAT LUMBAR FUSION  Right 12/27/2015   Procedure: Right Sided Lumbar tw-three Lateral lumbar interbody fusion with lateral plate;  Surgeon: Kevan Ny Ditty, MD;  Location: [facility];  Service: Neurosurgery;  Laterality: Right;  Right Sided L2-3 Lateral lumbar interbody fusion with lateral plate  . BACK SURGERY     2-3 fusion   . BLADDER SURGERY     x 2  . BREAST BIOPSY    . CARPAL TUNNEL RELEASE Bilateral   . CHOLECYSTECTOMY    . CHOLECYSTECTOMY, LAPAROSCOPIC  1998  . COLONOSCOPY    . COLOSTOMY  2011  . FOOT SURGERY    . HEEL SPUR SURGERY Right 12/2018   [place]  . HERNIA REPAIR    . KNEE SURGERY Left   . LUMBAR FUSION     2017  . REVERSE SHOULDER ARTHROPLASTY Left 06/10/2019   Procedure: LEFT REVERSE SHOULDER ARTHROPLASTY;  Surgeon: Nicholes Stairs, MD;  Location: [facility];  Service: Orthopedics;  Laterality: Left;  2 hrs  . SHOULDER SURGERY Left    x4 surgeries  . TONSILLECTOMY    . UPPER GASTROINTESTINAL ENDOSCOPY  2011    Her Family History Is Significant For: Family History  Problem Relation Age of Onset  . Thyroid disease Mother   . Stroke Mother   . Heart disease Mother   . Hyperlipidemia Mother   . Hypertension Mother   . Stroke Father   . Heart disease Father   . Hyperlipidemia Father   . Hypertension Father   . Diabetes Brother   . Colitis Daughter   . Colon polyps Daughter   . Stroke Maternal Grandmother   . Heart disease Maternal Grandfather        HEART ATTACK  . Heart disease Paternal Grandfather        HEART ATTACK  . Colon cancer Maternal Uncle   . Stomach cancer Neg Hx   . Esophageal cancer Neg Hx   . Pancreatic cancer Neg Hx   . Rectal cancer Neg Hx     Her Social History Is Significant For: Social  History   Socioeconomic History  . Marital status: Married    Spouse name: Not on file  . Number of children: 2  . Years of education: Not on file  . Highest education level: Not on file  Occupational History  . Occupation: Disabled     Comment: disabled  Tobacco Use  . Smoking status: Never Smoker  . Smokeless tobacco: Never Used  Vaping Use  . Vaping Use: Never used  Substance and Sexual Activity  . Alcohol use: Yes    Alcohol/week: 1.0 - 2.0 standard drink    Types: 1 - 2 Standard drinks or equivalent per week    Comment: rare, wine or wine cooler  . Drug use: No  . Sexual activity: Not on file  Other Topics Concern  . Not on file  Social History Narrative   Lives at home with husband and son.     Social Determinants of Health   Financial Resource Strain: Not on file  Food Insecurity: Not on file  Transportation Needs: Not on file  Physical Activity: Not on file  Stress: Not on file  Social Connections: Not on file    Her Allergies Are:  Allergies  Allergen Reactions  . Latex Other (See Comments)  . Tape Other (See Comments)    Blisters  . Aspirin Tinitus  :   Her Current Medications Are:  Outpatient Encounter Medications as of 01/26/2021  Medication Sig  . atorvastatin (LIPITOR) 20 MG tablet Take 1 tablet (20 mg total) by mouth daily. (Patient taking differently: Take 20 mg by mouth at bedtime.)  . dapagliflozin propanediol (FARXIGA) 5 MG TABS tablet Take 5 mg by mouth daily. Reported on 04/17/2016  . famotidine (PEPCID) 40 MG tablet Take 40 mg by mouth at bedtime.   Marland Kitchen glipiZIDE (GLUCOTROL) 10 MG tablet TAKE 1 TABLET THREE TIMES A DAY BEFORE MEALS (Patient taking differently: Take 10 mg by mouth 3 (three) times daily before meals.)  . levothyroxine (SYNTHROID, LEVOTHROID) 25 MCG tablet Take 1 tablet (25 mcg total) by mouth daily.  . traMADol (ULTRAM) 50 MG tablet Take 1 tablet (50 mg total) by mouth every 6 (six) hours as needed for moderate pain.  . [DISCONTINUED] cyclobenzaprine (FLEXERIL) 10 MG tablet Take 1 tablet (10 mg total) by mouth 3 (three) times daily as needed.  . [DISCONTINUED] ketoconazole (NIZORAL) 2 % cream Apply topically.  . [DISCONTINUED] lidocaine (XYLOCAINE) 2 % solution  SMARTSIG:By Mouth  . [DISCONTINUED] lisinopril (PRINIVIL,ZESTRIL) 10 MG tablet Take 10 mg by mouth daily.  . [DISCONTINUED] meloxicam (MOBIC) 15 MG tablet Take 1 tablet (15 mg total) by mouth daily. (Patient not taking: Reported on 07/01/2020)  . [DISCONTINUED] pantoprazole (PROTONIX) 40 MG tablet Take 1 tablet (40 mg total) by mouth 2 (two) times daily.  . [DISCONTINUED] Semaglutide (OZEMPIC, 0.25 OR 0.5 MG/DOSE, [place]) Inject 0.5 mg into the skin every Monday.    No facility-administered encounter medications on file as of 01/26/2021.  :  Review of Systems:  Out of a complete 14 point review of systems, all are reviewed and negative with the exception of these symptoms as listed below: Review of Systems  Neurological:       Here for consult on dementia. Pt reports reports family hx and wanted to evaluation. Pt is not using CPAP at this time, reports her last machine broke and has not restarted.     Objective:  Neurological Exam  Physical Exam Physical Examination:   Vitals:   01/26/21 1414  BP:  132/82  Pulse: 99  SpO2: 94%    General Examination: The patient is a very pleasant 60 y.o. female in no acute distress. She appears well-developed and well-nourished and well groomed.   HEENT: Normocephalic, atraumatic, pupils are equal, round and reactive to light.  Corrective eyeglasses in place.  Face is symmetric with normal facial animation.  Airway examination reveals mild mouth dryness, adequate dental hygiene, and mild airway crowding.  Tongue protrudes centrally and palate elevates symmetrically.   Chest: Clear to auscultation without wheezing, rhonchi or crackles noted.  Heart: S1+S2+0, regular and normal without murmurs, rubs or gallops noted.   Abdomen: Soft, non-tender and non-distended.  Extremities: There is no edema in the distal lower extremities bilaterally.   Skin: Warm and dry without trophic changes noted.  Musculoskeletal: exam reveals no obvious joint  deformities, tenderness or joint swelling or erythema.   Neurologically:  Mental status: The patient is awake, alert and oriented in all 4 spheres. Her memory, attention, language and knowledge are appropriate. There is no aphasia, agnosia, apraxia or anomia. Speech is clear with normal prosody and enunciation. Thought process is linear. Mood is congruent and affect is normal.   On 01/26/2021: MMSE: 29/30 (she lost 1 point on remote recall.), CDT: 4/4, AFT: 13/min.  Cranial nerves are as described above under HEENT exam.  Motor exam: Normal bulk, strength and tone is noted. There is no tremor. Reflexes are 1 to 2+ throughout. Fine motor skills are grossly intact.  Cerebellar testing shows no dysmetria or intention tremor. There is no truncal or gait ataxia.  Sensory exam is intact to light touch, pinprick, vibration, temperature sense in the upper and lower extremities.  Gait, station and balance are unremarkable. No veering to one side is noted. No leaning to one side is noted. Posture is age-appropriate and stance is narrow based. No problems turning are noted. She turns en bloc. Tandem walk is unremarkable. Intact toe and heel stance is noted.                 Assessment and Plan:   In summary, Tanique Matney is a very pleasant 60 y.o.-year old female with an underlying medical history of hypertension, hyperlipidemia, hypothyroidism, chronic kidney disease, obstructive sleep apnea, and obesity, who presents for evaluation of her memory loss.  She reports an approximately 2-year history of short-term memory issues, including forgetfulness, misplacing things, word finding difficulty and losing train of thought.  She reports a family history of dementia affecting both parents.  She has a fairly benign memory evaluation today.  She is advised that we will do additional testing in the form of blood work, we will check a B12 level in particular.  I recommend that we reevaluate her sleep apnea and she would  be willing to consider positive airway pressure treatment again.  I would like for her to have a brain MRI and we also talked about pursuing more in-depth memory evaluation with the help of a licensed neuropsychologist.  She would be willing to pursue cognitive testing with neuropsychology.  I made a referral in that regard.  We will continue to monitor her symptoms and we will call her with her test results by phone call in the interim.  We will plan a follow-up after her tests.  She is advised to continue to pursue a healthy lifestyle.  I did not suggest any new medications today.  I answered all her questions today and she was in agreement with the plan. Thank you  very much for allowing me to participate in the care of this nice patient. If I can be of any further assistance to you please do not hesitate to call me at [phone].  Sincerely,   Star Age, MD, PhD

## 2021-01-27 LAB — B12 AND FOLATE PANEL
Folate: 14.3 ng/mL (ref >3.0)
Vitamin B-12: 537 pg/mL (ref 232–1245)

## 2021-01-30 ENCOUNTER — Telehealth: Payer: Self-pay | Admitting: Neurology

## 2021-01-30 NOTE — Telephone Encounter (Signed)
Medicare/bcbs auth: NPR ref # [id number] order sent to [facility]. No auth they will reach out to the patient to schedule.

## 2021-01-30 NOTE — Progress Notes (Signed)
Vitamin B12 and folic acid levels were normal.  Please update patient.

## 2021-01-31 ENCOUNTER — Encounter: Payer: Self-pay | Admitting: *Deleted

## 2021-02-01 ENCOUNTER — Ambulatory Visit
Admission: RE | Admit: 2021-02-01 | Discharge: 2021-02-01 | Disposition: A | Discharge status: Home / Self Care | Payer: BLUE CROSS/BLUE SHIELD | Source: Ambulatory Visit | Attending: Neurology | Admitting: Neurology

## 2021-02-01 ENCOUNTER — Other Ambulatory Visit: Payer: Self-pay

## 2021-02-01 DIAGNOSIS — R419 Unspecified symptoms and signs involving cognitive functions and awareness: Secondary | ICD-10-CM

## 2021-02-01 DIAGNOSIS — Z818 Family history of other mental and behavioral disorders: Secondary | ICD-10-CM

## 2021-02-01 DIAGNOSIS — G4733 Obstructive sleep apnea (adult) (pediatric): Secondary | ICD-10-CM

## 2021-02-06 NOTE — Progress Notes (Signed)
Please call patient and advise her that her brain MRI without contrast was reported as normal.  As discussed, we will proceed with cognitive testing/evaluation through neuropsychology (as scheduled for April with Dr. Milbert Coulter) and a sleep study.  Please inquire if she has been contacted by the sleep lab yet.

## 2021-02-07 ENCOUNTER — Telehealth: Payer: Self-pay

## 2021-02-07 NOTE — Telephone Encounter (Signed)
I called patient to discuss her MRI.  No answer, voicemail not set up yet.  We will try again later.

## 2021-02-07 NOTE — Telephone Encounter (Signed)
-----   Message from Huston Foley, MD sent at 02/06/2021 10:31 AM EDT ----- Please call patient and advise her that her brain MRI without contrast was reported as normal.  As discussed, we will proceed with cognitive testing/evaluation through neuropsychology (as scheduled for April with Dr. Milbert Coulter) and a sleep study.  Please inquire if she has been contacted by the sleep lab yet.

## 2021-02-07 NOTE — Telephone Encounter (Signed)
Patient returned my call.  I advised her that her brain MRI was normal.  She is scheduled for neuro psychology testing in April.  Patient is also scheduled for sleep study on April 6.  I advised her that we will call her for her sleep study results when they are available.  Patient verbalized understanding of results.  Patient had no further questions or concerns.

## 2021-02-15 ENCOUNTER — Other Ambulatory Visit: Payer: Self-pay

## 2021-02-15 ENCOUNTER — Ambulatory Visit (INDEPENDENT_AMBULATORY_CARE_PROVIDER_SITE_OTHER): Payer: BLUE CROSS/BLUE SHIELD | Admitting: Neurology

## 2021-02-15 DIAGNOSIS — G4733 Obstructive sleep apnea (adult) (pediatric): Secondary | ICD-10-CM

## 2021-02-15 DIAGNOSIS — Z818 Family history of other mental and behavioral disorders: Secondary | ICD-10-CM

## 2021-02-15 DIAGNOSIS — R419 Unspecified symptoms and signs involving cognitive functions and awareness: Secondary | ICD-10-CM

## 2021-02-15 DIAGNOSIS — G4720 Circadian rhythm sleep disorder, unspecified type: Secondary | ICD-10-CM

## 2021-02-15 DIAGNOSIS — G4761 Periodic limb movement disorder: Secondary | ICD-10-CM

## 2021-02-16 ENCOUNTER — Other Ambulatory Visit: Payer: Self-pay

## 2021-02-16 ENCOUNTER — Encounter: Payer: Self-pay | Admitting: Psychology

## 2021-02-16 ENCOUNTER — Ambulatory Visit (INDEPENDENT_AMBULATORY_CARE_PROVIDER_SITE_OTHER): Payer: BLUE CROSS/BLUE SHIELD | Admitting: Psychology

## 2021-02-16 ENCOUNTER — Ambulatory Visit: Payer: BLUE CROSS/BLUE SHIELD | Admitting: Psychology

## 2021-02-16 DIAGNOSIS — G4733 Obstructive sleep apnea (adult) (pediatric): Secondary | ICD-10-CM | POA: Diagnosis not present

## 2021-02-16 DIAGNOSIS — R4189 Other symptoms and signs involving cognitive functions and awareness: Secondary | ICD-10-CM

## 2021-02-16 DIAGNOSIS — F32A Depression, unspecified: Secondary | ICD-10-CM

## 2021-02-16 DIAGNOSIS — F419 Anxiety disorder, unspecified: Secondary | ICD-10-CM | POA: Diagnosis not present

## 2021-02-16 NOTE — Progress Notes (Signed)
   Psychometrician Note   Cognitive testing was administered to Cathy Fuller by Wallace Keller, B.S. (psychometrist) under the supervision of Dr. Newman Nickels, Ph.D., licensed psychologist on 02/16/21. Cathy Fuller did not appear overtly distressed by the testing session per behavioral observation or responses across self-report questionnaires. Rest breaks were offered.    The battery of tests administered was selected by Dr. Newman Nickels, Ph.D. with consideration to Cathy Fuller's current level of functioning, the nature of her symptoms, emotional and behavioral responses during interview, level of literacy, observed level of motivation/effort, and the nature of the referral question. This battery was communicated to the psychometrist. Communication between Dr. Newman Nickels, Ph.D. and the psychometrist was ongoing throughout the evaluation and Dr. Newman Nickels, Ph.D. was immediately accessible at all times. Dr. Newman Nickels, Ph.D. provided supervision to the psychometrist on the date of this service to the extent necessary to assure the quality of all services provided.    Cathy Fuller will return within approximately 1-2 weeks for an interactive feedback session with Dr. Milbert Coulter at which time her test performances, clinical impressions, and treatment recommendations will be reviewed in detail. Cathy Fuller understands she can contact our office should she require our assistance before this time.  A total of 140 minutes of billable time were spent face-to-face with Cathy Fuller by the psychometrist. This includes both test administration and scoring time. Billing for these services is reflected in the clinical report generated by Dr. Newman Nickels, Ph.D.  This note reflects time spent with the psychometrician and does not include test scores or any clinical interpretations made by Dr. Milbert Coulter. The full report will follow in a separate note.

## 2021-02-16 NOTE — Progress Notes (Signed)
NEUROPSYCHOLOGICAL EVALUATION [place]. [facility] [place] [facility] Department of Neurology  Date of Evaluation: February 16, 2021  Reason for Referral:   Cathy Fuller is a 60 y.o. right-handed Caucasian female referred by Huston Foley, M.D., to characterize her current cognitive functioning and assist with diagnostic clarity and treatment planning in the context of subjective cognitive decline.   Assessment and Plan:   Clinical Impression(s): Cathy Fuller's pattern of performance is suggestive of neuropsychological functioning largely within normal limits relative to age-matched peers and premorbid intellectual estimations. Isolated weaknesses were exhibited across a digit repetition task assessing basic attention, as well as a phonemic fluency task. However, other tasks assessing more complex attention and expressive language were strong. As such, these isolated weaknesses should not be cause for concern at the present time. Performance was also appropriate across processing speed, executive functioning, receptive language, visuospatial abilities, and learning and memory. Cathy Fuller denied difficulties completing instrumental activities of daily living (ADLs) independently.  Across mood-related questionnaires, Cathy Fuller reported acute symptoms of moderate anxiety and mild depression. She also reported mild sleep dysfunction, likely related to longstanding untreated obstructive sleep apnea. Psychiatric distress and sleep dysfunction, either in combination or by themselves, can certainly influence cognitive efficiency and could explain Cathy Fuller's reported dysfunction in her day to day life. Based upon current test results, Cathy Fuller was able to learn novel verbal and visual information efficiently and retain this knowledge after lengthy delays. Overall, memory performance combined with intact performances across other areas of cognitive functioning is not suggestive of Alzheimer's disease.  Likewise, her cognitive and behavioral profile is not suggestive of any other form of neurodegenerative illness presently.  Recommendations: Should Cathy Fuller experienced a decline in cognitive ability or functional status in the future, a repeat neuropsychological evaluation would be recommended at that time to assess the trajectory of future cognitive decline. The current evaluation will serve as an excellent baseline for comparison purposes if necessary.   A combination of medication and psychotherapy has been shown to be most effective at treating symptoms of anxiety and depression. As such, Cathy Fuller is encouraged to speak with her prescribing physician regarding medication adjustments to optimally manage these symptoms. Likewise, Cathy Fuller is encouraged to consider engaging in short-term psychotherapy to address symptoms of psychiatric distress. She would benefit from an active and collaborative therapeutic environment, rather than one purely supportive in nature. Recommended treatment modalities include Cognitive Behavioral Therapy (CBT) or Acceptance and Commitment Therapy (ACT).  Cathy Fuller is strongly encouraged to utilize her CPAP machine on a nightly basis when she receives an operational device. Untreated sleep apnea will worsen cognitive dysfunction. It will also increase her risk for stroke, heart attack, and dementia.   Cathy Fuller is encouraged to attend to lifestyle factors for brain health (e.g., regular physical exercise, good nutrition habits, regular participation in cognitively-stimulating activities, and general stress management techniques), which are likely to have benefits for both emotional adjustment and cognition. In fact, in addition to promoting good general health, regular exercise incorporating aerobic activities (e.g., brisk walking, jogging, cycling, etc.) has been demonstrated to be a very effective treatment for depression and stress, with similar efficacy rates to  both antidepressant medication and psychotherapy. Optimal control of vascular risk factors (including safe cardiovascular exercise and adherence to dietary recommendations) is encouraged.   When learning new information, she would benefit from information being broken up into small, manageable pieces. She may also find it helpful to articulate the material in her own words  and in a context to promote encoding at the onset of a new task. This material may need to be repeated multiple times to promote encoding.  Memory can be improved using internal strategies such as rehearsal, repetition, chunking, mnemonics, association, and imagery. External strategies such as written notes in a consistently used memory journal, visual and nonverbal auditory cues such as a calendar on the refrigerator or appointments with alarm, such as on a cell phone, can also help maximize recall.    To address problems with fluctuating attention, she may wish to consider:   -Avoiding external distractions when needing to concentrate   -Limiting exposure to fast paced environments with multiple sensory demands   -Writing down complicated information and using checklists   -Attempting and completing one task at a time (i.e., no multi-tasking)   -Verbalizing aloud each step of a task to maintain focus   -Reducing the amount of information considered at one time  Review of Records:   Cathy Fuller was seen by [facility] Huston Foley, M.D.) on 01/26/2021 for an evaluation of memory loss. Briefly, Cathy Fuller reported short-term memory issues ongoing for the past couple of years. She specified over the past 3 to 4 months, short-term memory has worsened. Provided examples included her being more forgetful, misplacing items around her residence, the presence of word finding difficulties, and losing her train of thought more easily. She noted that her family has also expressed some memory concerns. She reported a family  history of dementia in both her parents and that she recently had a beta amyloid test which was one-point away from being abnormal. Records surrounding this test or its findings were unavailable to Dr. Frances Furbish. She was previously evaluated for obstructive sleep apnea and prescribed a CPAP machine. Unfortunately, this device broke and she stopped using it 2-2.5 years ago. She did recall benefiting from treatment, feeling as though she had more energy during the day. Dr. Frances Furbish referred her for an updated sleep study to resume CPAP treatment. Ultimately, Ms. Olexa was referred for a comprehensive neuropsychological evaluation to characterize her cognitive abilities and to assist with diagnostic clarity and treatment planning.   Labs on 12/08/2020 suggested: CBC was benign, hemoglobin A1c elevated at 9.8, CMP showed BUN of 14, creatinine 0.9, AST 17, ALT 28, lipid profile showed total cholesterol of 173, triglycerides 358, LDL 75, HDL 41, TSH was 1.39, free T4 1.16.  Brain MRI on 02/02/2021 was said to be unremarkable.   Past Medical History:  Diagnosis Date  . Achilles tendinitis of right lower extremity 12/19/2018  . Acquired short Achilles tendon of right lower extremity 12/19/2018  . Anemia    as a teenager  . Barrett's esophagus   . Blood transfusion without reported diagnosis   . Cataract    bilateral cateracts  . Cervical radiculopathy 09/09/2018  . CKD stage 3 due to type 2 diabetes mellitus 08/17/2018   Continue efforts to manage hypertension and hyperlipidemia. She is on [place]. Resume ARB if blood pressure remains elevated.  . Complication of anesthesia    1978 hard time waking up after surgery and low blood pressure  . Diabetes mellitus type 2, controlled, without complications 05/29/2010  . Diabetes mellitus without complication (HCC)   . Eosinophilic esophagitis 08/04/2020   By upper endoscopy. No Barrett's 07/2020.  Marland Kitchen Esophageal stricture   . Fatty liver disease 05/29/2010  .  Hepatitis A 1983  . Hiatal hernia with GERD and esophagitis 08/04/2020   EGD 07/2020 2 cm  .  History of colonic polyps 05/29/2010  . History of kidney stones    yrs. ago  . Hyperlipidemia associated with type 2 diabetes mellitus 01/17/2016   Await labs. Adjust regimen as indicated by results. Goal LDL <70  . Hypertension associated with chronic kidney disease due to type 2 diabetes mellitus 08/08/2018   Well controlled. Tolerated ARB, but caused mild hypotension. If remains elevated, would resume ARB, which she previously tolerated but was discontinued due  . Hypothyroidism 01/30/2012   Has been stable. Await lab results.  . IBS (irritable bowel syndrome)   . 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 Internal hemorrhoids   . Lumbar radiculopathy 08/04/2020  . NASH (nonalcoholic steatohepatitis)    grade 1 fibrosis  . Neuromuscular disorder    mild case of MG - per patient not MS  . OSA (obstructive sleep apnea) 10/27/2013   CPAP machine is not currently working.  Needs new evaluation with sleep medicine.  . Osteoarthritis of left shoulder 06/10/2019  . Pain in joint of right ankle 02/23/2019  . Posterior calcaneal exostosis 12/19/2018  . PUD (peptic ulcer disease)   . Rotator cuff arthropathy of left shoulder 09/09/2018  . 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 S/P spinal fusion 08/08/2018  . Spondylolisthesis of lumbar region 12/27/2015   Formatting of this note might be different from the original. Last Assessment & Plan:  Continue pain management with tramadol.  Last Assessment & Plan:  Formatting of this note might be different from the original. Chronic and stable.  Well managed with tramadol.  . Stroke-like symptoms 1978   drug induced stroke    Past Surgical History:  Procedure Laterality Date  . ABDOMINAL HYSTERECTOMY    . ANTERIOR LAT LUMBAR FUSION Right 12/27/2015   Procedure: Right Sided Lumbar tw-three Lateral lumbar interbody fusion with  lateral plate;  Surgeon: Loura Halt Ditty, MD;  Location: [facility];  Service: Neurosurgery;  Laterality: Right;  Right Sided L2-3 Lateral lumbar interbody fusion with lateral plate  . BACK SURGERY     2-3 fusion   . BLADDER SURGERY     x 2  . BREAST BIOPSY    . CARPAL TUNNEL RELEASE Bilateral   . CHOLECYSTECTOMY    . CHOLECYSTECTOMY, LAPAROSCOPIC  1998  . COLONOSCOPY    . COLOSTOMY  2011  . FOOT SURGERY    . HEEL SPUR SURGERY Right 12/2018   [place]  . HERNIA REPAIR    . KNEE SURGERY Left   . LUMBAR FUSION     2017  . REVERSE SHOULDER ARTHROPLASTY Left 06/10/2019   Procedure: LEFT REVERSE SHOULDER ARTHROPLASTY;  Surgeon: Yolonda Kida, MD;  Location: [facility] OR;  Service: Orthopedics;  Laterality: Left;  2 hrs  . SHOULDER SURGERY Left    x4 surgeries  . TONSILLECTOMY    . UPPER GASTROINTESTINAL ENDOSCOPY  2011    Current Outpatient Medications:  .  atorvastatin (LIPITOR) 20 MG tablet, Take 1 tablet (20 mg total) by mouth daily. (Patient taking differently: Take 20 mg by mouth at bedtime.), Disp: 90 tablet, Rfl: 3 .  dapagliflozin propanediol (FARXIGA) 5 MG TABS tablet, Take 5 mg by mouth daily. Reported on 04/17/2016, Disp: 90 tablet, Rfl: 3 .  famotidine (PEPCID) 40 MG tablet, Take 40 mg by mouth at bedtime. , Disp: , Rfl:  .  glipiZIDE (GLUCOTROL) 10 MG tablet, TAKE 1 TABLET THREE TIMES A DAY BEFORE MEALS (Patient taking differently: Take 10 mg by mouth 3 (three)  times daily before meals.), Disp: 270 tablet, Rfl: 3 .  levothyroxine (SYNTHROID, LEVOTHROID) 25 MCG tablet, Take 1 tablet (25 mcg total) by mouth daily., Disp: 90 tablet, Rfl: 3 .  traMADol (ULTRAM) 50 MG tablet, Take 1 tablet (50 mg total) by mouth every 6 (six) hours as needed for moderate pain., Disp: 40 tablet, Rfl: 0  Clinical Interview:   The following information was obtained during a clinical interview with Ms. Hammes prior to cognitive testing.  Cognitive Symptoms: Decreased short-term  memory: Endorsed. She reported primary difficulties losing her train of thought and immediately forgetting what she was saying if interrupted. She also noted some trouble recalling the details of previous conversations, as well as misplacing things in her environment. Deficits were said to be present for the past few years but have seemed more notable during the past few months.  Decreased long-term memory: Denied. Decreased attention/concentration: Endorsed. She noted some trouble with distractibility and stated that she does not always feel as focused as she feels she should.  Reduced processing speed: Denied. Difficulties with executive functions: Largely denied. She did acknowledge mildly increased impulsivity as it pertains to shopping behaviors. Overt personality changes were generally denied. However, she stated that her husband would likely say that she has seemed more irritable, grouchy, and snappy during the past year. She did agree with this assessment and attributed this to a significant degree of family and medical-related stress.  Difficulties with emotion regulation: Denied. Difficulties with receptive language: Denied. Difficulties with word finding: Denied. Decreased visuoperceptual ability: Denied.  Difficulties completing ADLs: Denied.  Additional Medical History: History of traumatic brain injury/concussion: Endorsed. She reported falling down a flight of stairs when she was 60-45 years old, experiencing an unknown but likely brief loss in consciousness. Persisting symptoms stemming from this event were not reported. No other head injuries were endorsed.  History of stroke: Denied. History of seizure activity: Denied. History of known exposure to toxins: Denied. Symptoms of chronic pain: Endorsed. Pain symptoms were generally localized to her back, shoulder, and right hip.  Experience of frequent headaches/migraines: Denied. She acknowledged a remote history of severe and  frequent migraine headaches. However, migraine headaches have significantly decreased in frequency with medication assistance. She reported occasional normal headache symptoms during the week, stating that these likely stem from shoulder pain which will involve her neck and radiate to her head.  Frequent instances of dizziness/vertigo: Denied.  Sensory changes: She wears glasses with positive effect. She also reported ongoing tinnitus in her right ear and some mild hearing loss overall. Other sensory changes/difficulties (e.g., taste or smell) were denied.  Balance/coordination difficulties: Endorsed. She reported experiencing a significant sinus infection with bilateral eardrum rupture in the past which resulted in mild ongoing balance instability. These symptoms were said to have gradually improved over time. No recent falls were reported. Medical records also suggest a prior achilles injury.  Other motor difficulties: She reported some loss of grip strength when picking up small objects in her fingers and then turning her hands. This was said to occur bilaterally. No tremors were reported.   Sleep History: Estimated hours obtained each night: Maybe 5-6 hours. She reported a history of working third shift which led to some problematic sleeping habits in the past.  Difficulties falling asleep: Variably so. Difficulties staying asleep: Endorsed. At least some of these difficulties were attributed to ongoing shoulder pain and trouble remaining comfortable for long stretches of time while lying down.  Feels rested and refreshed upon awakening:  Denied. She reported waking not feeling rested and will often nap at some point during the afternoon.   History of snoring: Denied. History of waking up gasping for air: Denied. Witnessed breath cessation while asleep: Denied. She acknowledged a history of obstructive sleep apnea. She reported that her CPAP machine broke 2-3 years prior and she stopped using that  device entirely. She noted that she had completed a sleep study during the morning prior to the current appointment and expressed her intention to resume using this device once it is functional again.   History of vivid dreaming: Denied. Excessive movement while asleep: Denied. Instances of acting out her dreams: Denied.  Psychiatric/Behavioral Health History: Depression: She denied a history of depressive symptoms in the past to the extent where medications were prescribed or required. However, she did describe her current mood as "bummed out," noting that she will attempt to keep busy in order to prevent herself from feeling poorly. Much of this seems to be related to significant family and medical stressors.  Anxiety: Denied. Experienced anxiety was said to be situational in nature and not consistent.  Mania: Denied. Trauma History: Denied. Visual/auditory hallucinations: Denied. Delusional thoughts: Denied.  Tobacco: Denied. Alcohol: She reported very rare alcohol consumption and denied a history of problematic alcohol abuse or dependence.  Recreational drugs: Denied. Caffeine: She reported consuming 0-1 cups of coffee daily, as well as an occasional glass of sweet tea. She noted attempting to cut back on caffeinated beverages and increase water intake.   Family History: Problem Relation Age of Onset  . Thyroid disease Mother   . Stroke Mother   . Heart disease Mother   . Hyperlipidemia Mother   . Hypertension Mother   . Dementia Mother   . Stroke Father   . Heart disease Father   . Hyperlipidemia Father   . Hypertension Father   . Dementia Father   . Diabetes Brother   . Colitis Daughter   . Colon polyps Daughter   . Stroke Maternal Grandmother   . Heart disease Maternal Grandfather        Heart attack  . Heart disease Paternal Grandfather        Heart attack  . Colon cancer Maternal Uncle   . Stomach cancer Neg Hx   . Esophageal cancer Neg Hx   . Pancreatic cancer Neg  Hx   . Rectal cancer Neg Hx    This information was confirmed by Ms. Pino.  Academic/Vocational History: Highest level of educational attainment: 12 years. She graduated from high school and completed one additional semester of college. She described herself as an average (A/B/C) student in academic settings. Math was noted as a relative weakness; however, this was said to be somewhat teacher dependant.  History of developmental delay: Denied. History of grade repetition: Denied. Enrollment in special education courses: Denied. History of LD/ADHD: Denied.  Employment: Disability. She previously worked as a Archivist.   Evaluation Results:   Behavioral Observations: Ms. Shehan was unaccompanied, arrived to her appointment on time, and was appropriately dressed and groomed. She appeared alert and oriented. Observed gait and station were within normal limits. Gross motor functioning appeared intact upon informal observation and no abnormal movements (e.g., tremors) were noted. Her affect was generally relaxed and positive, but did range appropriately given the subject being discussed during the clinical interview or the task at hand during testing procedures. Spontaneous speech was fluent and word finding difficulties were not observed during the clinical interview. Thought processes  were coherent, organized, and normal in content. Insight into her cognitive difficulties appeared adequate. During testing, sustained attention was appropriate. Task engagement was adequate and she persisted when challenged. Overall, Ms. Gentz was cooperative with the clinical interview and subsequent testing procedures.   Adequacy of Effort: The validity of neuropsychological testing is limited by the extent to which the individual being tested may be assumed to have exerted adequate effort during testing. Ms. Yount expressed her intention to perform to the best of her abilities and exhibited adequate task  engagement and persistence. Scores across stand-alone and embedded performance validity measures were within expectation. As such, the results of the current evaluation are believed to be a valid representation of Ms. Cihlar's current cognitive functioning.  Test Results: Ms. Dardis was fully oriented at the time of the current evaluation.  Intellectual abilities based upon educational and vocational attainment were estimated to be in the average range. Premorbid abilities were estimated to be within the average range based upon a single-word reading test.   Processing speed was average to above average. Basic attention was well below average across a digit repetition task. More complex attention (e.g., working memory) was average. Executive functioning was average to well above average. She also performed in the well above average range across a task assessing safety and judgment.   Assessed receptive language abilities were average. Likewise, Ms. Haffey did not exhibit any difficulties comprehending task instructions and answered all questions asked of her appropriately. Assessed expressive language was somewhat variable. Phonemic fluency was well below average while semantic fluency and confrontation naming were average to above average.     Assessed visuospatial/visuoconstructional abilities were well above average to exceptionally high.    Learning (i.e., encoding) of novel verbal and visual information was average to well above average. Spontaneous delayed recall (i.e., retrieval) of previously learned information was also average to well above average. Retention rates were 94% across a story learning task, 82% across a list learning task, and 114% across a shape learning task. Performance across recognition tasks was average to above average, suggesting evidence for information consolidation.   Results of emotional screening instruments suggested that recent symptoms of generalized anxiety were  in the moderate range, while symptoms of depression were within the mild range. A screening instrument assessing recent sleep quality suggested the presence of mild sleep dysfunction.  Tables of Scores:   Note: This summary of test scores accompanies the interpretive report and should not be considered in isolation without reference to the appropriate sections in the text. Descriptors are based on appropriate normative data and may be adjusted based on clinical judgment. The terms "impaired" and "within normal limits (WNL)" are used when a more specific level of functioning cannot be determined.       Effort Testing:   DESCRIPTOR       ACS Word Choice: --- --- Within Expectation  Dot Counting Test: --- --- Within Expectation  NAB EVI: --- --- Within Expectation  D-KEFS Color Word Effort Index: --- --- Within Expectation       Orientation:      Raw Score Percentile   NAB Orientation, Form 1 29/29 --- ---       Cognitive Screening:           Raw Score Percentile   SLUMS: 30/30 --- ---       Intellectual Functioning:           Standard Score Percentile   Test of Premorbid Functioning: 94 34 Average  Memory:          NAB Memory Module, Form 1: Standard Score/ T Score Percentile   Total Memory Index 118 88 Above Average  List Learning       Total Trials 1-3 23/36 (51) 54 Average    List B 7/12 (64) 92 Well Above Average    Short Delay Free Recall 11/12 (70) 98 Exceptionally High    Long Delay Free Recall 9/12 (59) 82 Above Average    Retention Percentage 82 (44) 27 Average    Recognition Discriminability 8 (50) 50 Average  Shape Learning       Total Trials 1-3 21/27 (65) 93 Well Above Average    Delayed Recall 8/9 (68) 96 Well Above Average    Retention Percentage 114 (56) 73 Average    Recognition Discriminability 9 (61) 86 Above Average  Story Learning       Immediate Recall 61/80 (50) 50 Average    Delayed Recall 34/40 (53) 62 Average    Retention Percentage 94 (51)  54 Average  Daily Living Memory       Immediate Recall 46/51 (59) 82 Above Average    Delayed Recall 14/17 (50) 50 Average    Retention Percentage 82 (46) 34 Average    Recognition Hits 10/10 (60) 84 Above Average       Attention/Executive Function:          Trail Making Test (TMT): Raw Score (T Score) Percentile     Part A 26 secs.,  1 error (56) 73 Average    Part B 50 secs.,  0 errors (63) 91 Well Above Average         Scaled Score Percentile   WAIS-IV Coding: 11 63 Average       NAB Attention Module, Form 1: T Score Percentile     Digits Forward 36 8 Well Below Average    Digits Backwards 51 54 Average       D-KEFS Color-Word Interference Test: Raw Score (Scaled Score) Percentile     Color Naming 27 secs. (12) 75 Above Average    Word Reading 18 secs. (13) 84 Above Average    Inhibition 52 secs. (13) 84 Above Average      Total Errors 0 errors (12) 75 Above Average    Inhibition/Switching 56 secs. (13) 84 Above Average      Total Errors 0 errors (13) 84 Above Average       [employer] Test: Raw Score Percentile     Categories (trials) 4 (64) >16 Within Normal Limits    Total Errors 13 69 Average    Perseverative Errors 7 66 Average    Non-Perseverative Errors 6 38 Average    Failure to Maintain Set 0 --- ---       NAB Executive Functions Module, Form 1: T Score Percentile     Judgment 63 91 Well Above Average       Language:          Verbal Fluency Test: Raw Score (T Score) Percentile     Phonemic Fluency (FAS) 23 (35) 7 Well Below Average    Animal Fluency 24 (59) 82 Above Average        NAB Language Module, Form 1: T Score Percentile     Auditory Comprehension 55 69 Average    Naming 31/31 (56) 73 Average       Visuospatial/Visuoconstruction:      Raw Score Percentile   Clock Drawing: 10/10 --- Within  Normal Limits       NAB Spatial Module, Form 1: T Score Percentile     Figure Drawing Copy 75 99 Exceptionally High        Scaled Score  Percentile   WAIS-IV Block Design: 15 95 Well Above Average       Mood and Personality:      Raw Score Percentile   Geriatric Depression Scale: 15 --- Mild  Geriatric Anxiety Scale: 24 --- Moderate    Somatic 8 --- Mild    Cognitive 6 --- Mild    Affective 10 --- Severe       Additional Questionnaires:      Raw Score Percentile   PROMIS Sleep Disturbance Questionnaire: 25 --- Mild   Informed Consent and Coding/Compliance:   The current evaluation represents a clinical evaluation for the purposes previously outlined by the referral source and is in no way reflective of a forensic evaluation.   Ms. Recchia was provided with a verbal description of the nature and purpose of the present neuropsychological evaluation. Also reviewed were the foreseeable risks and/or discomforts and benefits of the procedure, limits of confidentiality, and mandatory reporting requirements of this provider. The patient was given the opportunity to ask questions and receive answers about the evaluation. Oral consent to participate was provided by the patient.   This evaluation was conducted by Newman Nickels, Ph.D., licensed clinical neuropsychologist. Ms. Sailer completed a clinical interview with Dr. Milbert Coulter, billed as one unit [phone], and 140 minutes of cognitive testing and scoring, billed as one unit [phone] and four additional units [phone]. Psychometrist Wallace Keller, B.S., assisted Dr. Milbert Coulter with test administration and scoring procedures. As a separate and discrete service, Dr. Milbert Coulter spent a total of 130 minutes in interpretation and report writing billed as one unit [phone] and one unit [phone].

## 2021-02-20 NOTE — Addendum Note (Signed)
Addended by: Huston Foley on: 02/20/2021 05:56 PM   Modules accepted: Orders

## 2021-02-20 NOTE — Progress Notes (Signed)
Patient referred by Dr. Everlene Other for memory loss, seen by me on 01/26/21 at which time we talked about her prior Dx of OSA. She had a diagnostic PSG on 02/15/21 for re-evaluation.    Please call and notify the patient that the recent sleep study did confirm the diagnosis of obstructive sleep apnea. OSA is overall mild, but severe in REM sleep with a total AHI of 13.1/hour, REM AHI of 34.8/hour, supine AHI of 30.9/hour and O2 nadir of 77% (during supine REM sleep). Given the patient's medical history and sleep related complaints, as well as previous benefit from positive airway pressure (PAP) treatment, restarting PAP therapy is recommended; this can be achieved in the form of autoPAP. This means, that we don't have to bring her back for a second sleep study with CPAP, but will let him try an autoPAP machine at home, through a DME company (of her choice, or as per insurance requirement). The DME representative will educate her on how to use the machine, how to put the mask on, etc. I have placed an order in the chart. Please send referral, talk to patient, send report to referring MD. We will need a FU in sleep clinic for 10 weeks post-PAP set up, please arrange that with me or one of our NPs. Thanks,   Huston Foley, MD, PhD [facility])

## 2021-02-20 NOTE — Procedures (Signed)
PATIENT'S NAME:  Cathy Fuller, Cathy Fuller DOB:      Oct 22, 1961      MR#:    [id number]     DATE OF RECORDING: 02/15/2021 REFERRING M.D.:  Tracey Harries, MD Study Performed:   Baseline Polysomnogram HISTORY: 60 year old woman with a history of hypertension, hyperlipidemia, hypothyroidism, chronic kidney disease, obstructive sleep apnea, and obesity, who reports short-term memory issues. She has a prior history of sleep apnea and used CPAP in the past with benefit reported. She presents for re-evaluation. The patient endorsed the Epworth Sleepiness Scale at 5 points. The patient's weight 189 pounds with a height of 64(inches).   CURRENT MEDICATIONS: Lipitor, Farxiga, Pepcid, Glucotrol, Synthroid, Ultram,   PROCEDURE:  This is a multichannel digital polysomnogram utilizing the Somnostar 11.2 system.  Electrodes and sensors were applied and monitored per AASM Specifications.   EEG, EOG, Chin and Limb EMG, were sampled at 200 Hz.  ECG, Snore and Nasal Pressure, Thermal Airflow, Respiratory Effort, CPAP Flow and Pressure, Oximetry was sampled at 50 Hz. Digital video and audio were recorded.      BASELINE STUDY  Lights Out was at 22:19 and Lights On at 05:41.  Total recording time (TRT) was 442.5 minutes, with a total sleep time (TST) of 347.5 minutes.   The patient's sleep latency to persistent sleep was 28.5 minutes.  REM latency was 150 minutes, which is delayed. The sleep efficiency was 78.5 %.     SLEEP ARCHITECTURE: WASO (Wake after sleep onset) was 88 minutes with mild to moderate sleep fragmentation noted. There were 34.5 minutes in Stage N1, 191.5 minutes Stage N2, 59.5 minutes Stage N3 and 62 minutes in Stage REM.  The percentage of Stage N1 was 9.9%, which is increased, Stage N2 was 55.1%, which is normal, Stage N3 was 17.1%, which is normal, and Stage R (REM sleep) was 17.8%, which is near-normal. The arousals were noted as: 14 were spontaneous, 5 were associated with PLMs, 3 were associated with  respiratory events.  RESPIRATORY ANALYSIS:  There were a total of 76 respiratory events:  0 obstructive apneas, 0 central apneas and 0 mixed apneas with a total of 0 apneas and an apnea index (AI) of 0 /hour. There were 76 hypopneas with a hypopnea index of 13.1 /hour. The patient also had 0 respiratory event related arousals (RERAs).      The total APNEA/HYPOPNEA INDEX (AHI) was 13.1/hour and the total RESPIRATORY DISTURBANCE INDEX was  13.1 /hour.  36 events occurred in REM sleep and 80 events in NREM. The REM AHI was  34.8 /hour, versus a non-REM AHI of 8.4. The patient spent 83.5 minutes of total sleep time in the supine position and 264 minutes in non-supine.. The supine AHI was 30.9 versus a non-supine AHI of 7.5.  OXYGEN SATURATION & C02:  The Wake baseline 02 saturation was 98%, with the lowest being 77%. Time spent below 89% saturation equaled 33 minutes.  PERIODIC LIMB MOVEMENTS: The patient had a total of 229 Periodic Limb Movements.  The Periodic Limb Movement (PLM) index was 39.5 and the PLM Arousal index was .9/hour.  Audio and video analysis did not show any abnormal or unusual movements, behaviors, phonations or vocalizations. The patient took 3 bathroom breaks. Mild snoring was noted. The EKG was in keeping with normal sinus rhythm (NSR).  Post-study, the patient indicated that sleep was better than usual.   IMPRESSION: 1. Obstructive Sleep Apnea (OSA) 2. Periodic Limb Movement Disorder (PLMD) 3. Dysfunctions associated with sleep stages or arousal  from sleep  RECOMMENDATIONS: 1. This study demonstrates overall mild obstructive sleep apnea, severe in REM sleep with a total AHI of 13.1/hour, REM AHI of 34.8/hour, supine AHI of 30.9/hour and O2 nadir of 77% (during supine REM sleep). Given the patient's medical history and sleep related complaints, as well as previous benefit from positive airway pressure (PAP) treatment, restarting PAP therapy is recommended; this can be achieved  in the form of autoPAP. Alternatively, a full-night CPAP titration study would allow optimization of therapy, if needed down the road. Other treatment options may include avoidance of supine sleep position along with weight loss, upper airway or jaw surgery in selected patients or the use of an oral appliance in certain patients. ENT evaluation and/or consultation with a maxillofacial surgeon or dentist may be feasible in some instances. Please note that untreated obstructive sleep apnea may carry additional perioperative morbidity. Patients with significant obstructive sleep apnea should receive perioperative PAP therapy and the surgeons and particularly the anesthesiologist should be informed of the diagnosis and the severity of the sleep disordered breathing. 2. Moderate PLMs (periodic limb movements of sleep) were noted during this study with no significant arousals; clinical correlation is recommended. PLMs may improve with OSA treatment.  3. This study shows some sleep fragmentation and minimally abnormal sleep stage percentages; these are nonspecific findings and per se do not signify an intrinsic sleep disorder or a cause for the patient's sleep-related symptoms. Causes include (but are not limited to) the first night effect of the sleep study, circadian rhythm disturbances, medication effect or an underlying mood disorder or medical problem.  4. The patient should be cautioned not to drive, work at heights, or operate dangerous or heavy equipment when tired or sleepy. Review and reiteration of good sleep hygiene measures should be pursued with any patient. 5. The patient will be seen in follow-up by Dr. Frances Furbish at [facility] for discussion of the test results and further management strategies. The referring provider will be notified of the test results.  I certify that I have reviewed the entire raw data recording prior to the issuance of this report in accordance with the Standards of Accreditation of the  American Academy of Sleep Medicine (AASM)  Huston Foley, MD, PhD Diplomat, American Board of Neurology and Sleep Medicine (Neurology and Sleep Medicine)

## 2021-02-21 ENCOUNTER — Telehealth: Payer: Self-pay

## 2021-02-21 NOTE — Telephone Encounter (Signed)
-----   Message from Huston Foley, MD sent at 02/20/2021  5:55 PM EDT ----- Patient referred by Dr. Everlene Other for memory loss, seen by me on 01/26/21 at which time we talked about her prior Dx of OSA. She had a diagnostic PSG on 02/15/21 for re-evaluation.    Please call and notify the patient that the recent sleep study did confirm the diagnosis of obstructive sleep apnea. OSA is overall mild, but severe in REM sleep with a total AHI of 13.1/hour, REM AHI of 34.8/hour, supine AHI of 30.9/hour and O2 nadir of 77% (during supine REM sleep). Given the patient's medical history and sleep related complaints, as well as previous benefit from positive airway pressure (PAP) treatment, restarting PAP therapy is recommended; this can be achieved in the form of autoPAP. This means, that we don't have to bring her back for a second sleep study with CPAP, but will let him try an autoPAP machine at home, through a DME company (of her choice, or as per insurance requirement). The DME representative will educate her on how to use the machine, how to put the mask on, etc. I have placed an order in the chart. Please send referral, talk to patient, send report to referring MD. We will need a FU in sleep clinic for 10 weeks post-PAP set up, please arrange that with me or one of our NPs. Thanks,   Huston Foley, MD, PhD [facility])

## 2021-02-21 NOTE — Telephone Encounter (Signed)
I called pt. I advised pt that Dr. Frances Furbish reviewed their sleep study results and found that pt mild in NREM osa was found and severe osa was found in REM. Dr. Frances Furbish recommends that pt start an autopap at home for treatment. I reviewed PAP compliance expectations with the pt. Pt is agreeable to starting an auto-PAP. I advised pt that an order will be sent to a DME, [facility], and [facility] will call the pt within about one week after they file with the pt's insurance. [facility] will show the pt how to use the machine, fit for masks, and troubleshoot the auto-PAP if needed. A follow up appt was made for insurance purposes with Dr. Frances Furbish  on 05/17/21 at 830. Pt verbalized understanding to arrive 15 minutes early and bring their auto-PAP. A letter with all of this information in it will be mailed to the pt as a reminder. I verified with the pt that the address we have on file is correct. Pt verbalized understanding of results. Pt had no questions at this time but was encouraged to call back if questions arise. I have sent the order to [facility] and have received confirmation that they have received the order.

## 2021-02-23 ENCOUNTER — Ambulatory Visit (INDEPENDENT_AMBULATORY_CARE_PROVIDER_SITE_OTHER): Payer: BLUE CROSS/BLUE SHIELD | Admitting: Psychology

## 2021-02-23 ENCOUNTER — Other Ambulatory Visit: Payer: Self-pay

## 2021-02-23 DIAGNOSIS — R4189 Other symptoms and signs involving cognitive functions and awareness: Secondary | ICD-10-CM

## 2021-02-23 DIAGNOSIS — G4733 Obstructive sleep apnea (adult) (pediatric): Secondary | ICD-10-CM | POA: Diagnosis not present

## 2021-02-23 DIAGNOSIS — F419 Anxiety disorder, unspecified: Secondary | ICD-10-CM

## 2021-02-23 DIAGNOSIS — F32A Depression, unspecified: Secondary | ICD-10-CM

## 2021-02-23 NOTE — Progress Notes (Signed)
   Neuropsychology Feedback Session Cathy Fuller. [facility] [place] Department of Neurology  Reason for Referral:   Cathy Fuller a 60 y.o. right-handed Caucasian female referred by Huston Foley, M.D.,to characterize hercurrent cognitive functioning and assist with diagnostic clarity and treatment planning in the context of subjective cognitive decline.   Feedback:   [name] completed a comprehensive neuropsychological evaluation on 02/16/2021. Please refer to that encounter for the full report and recommendations. Briefly, results suggested neuropsychological functioning largely within normal limits relative to age-matched peers and premorbid intellectual estimations. Isolated weaknesses were exhibited across a digit repetition task assessing basic attention, as well as a phonemic fluency task. However, other tasks assessing more complex attention and expressive language were strong. As such, these isolated weaknesses should not be cause for concern at the present time. Performance was also appropriate across processing speed, executive functioning, receptive language, visuospatial abilities, and learning and memory. [name] denied difficulties completing instrumental activities of daily living (ADLs) independently.  [name] was unaccompanied during the current feedback session. Content of the current session focused on the results of her neuropsychological evaluation. [name] was given the opportunity to ask questions and her questions were answered. She was encouraged to reach out should additional questions arise. A copy of her report was provided at the conclusion of the visit.      28 minutes were spent conducting the current feedback session with [name], billed as one unit [phone].

## 2021-05-16 ENCOUNTER — Telehealth: Payer: Self-pay | Admitting: *Deleted

## 2021-05-17 ENCOUNTER — Ambulatory Visit: Payer: Self-pay | Admitting: Neurology

## 2021-05-17 NOTE — Telephone Encounter (Signed)
I called pt and relayed via VM that trying to reach relating to having machine?  I checked with DME [facility] and did not show that she had machine as yet.   I asked her to call back.

## 2022-03-19 ENCOUNTER — Ambulatory Visit (INDEPENDENT_AMBULATORY_CARE_PROVIDER_SITE_OTHER): Payer: BLUE CROSS/BLUE SHIELD | Admitting: Neurology

## 2022-03-19 ENCOUNTER — Encounter: Payer: Self-pay | Admitting: Neurology

## 2022-03-19 ENCOUNTER — Telehealth: Payer: Self-pay | Admitting: *Deleted

## 2022-03-19 VITALS — BP 126/78 | HR 76 | Ht 64.0 in | Wt 193.4 lb

## 2022-03-19 DIAGNOSIS — G501 Atypical facial pain: Secondary | ICD-10-CM

## 2022-03-19 DIAGNOSIS — G4733 Obstructive sleep apnea (adult) (pediatric): Secondary | ICD-10-CM

## 2022-03-19 NOTE — Progress Notes (Signed)
Subjective:  ?  ?Patient ID: Cathy Fuller is a 61 y.o. female. ? ?HPI ? ? ? ?Interim history:  ? ?Cathy Fuller is a 61 year old right-handed woman with an underlying medical history of hypertension, hyperlipidemia, hypothyroidism, chronic kidney disease, obstructive sleep apnea, and obesity, who presents for a new problem visit for facial pain, concern for trigeminal neuralgia.  She is referred by Dr. Bernerd Limbo.  I saw her last year for cognitive complaints and sleep apnea.  She was last seen in this clinic on 01/26/2021 at the request of her primary care physician, at which time she reported a several month history of short-term memory problems.  Her MMSE was 29 out of 30 at the time.  She was advised to proceed with further evaluation with neuropsychology and a brain MRI. ? ?She had a brain MRI without contrast on 02/01/2021 and I reviewed the results:  ?  ?IMPRESSION: ?  ?Unremarkable MRI brain without contrast. ?  ?She was notified of the test results by phone call. ? ?She had a baseline sleep study on 02/15/2021, which showed a total AHI of 13.1/h, REM AHI in the severe range at 34.8/h, O2 nadir 77%.  She was advised to start AutoPap therapy. ? ?She had neuropsychological evaluation with Dr. Hazle Coca on 02/16/2021 and a follow-up appointment on 02/23/2021.  I reviewed the results and recommendations:  ?<<Clinical Impression(s): ? ?Cathy Fuller's pattern of performance is suggestive of neuropsychological functioning largely within normal limits relative to age-matched peers and premorbid intellectual estimations. Isolated weaknesses were exhibited across a digit repetition task assessing basic attention, as well as a phonemic fluency task. However, other tasks assessing more complex attention and expressive language were strong. As such, these isolated weaknesses should not be cause for concern at the present time. Performance was also appropriate across processing speed, executive functioning, receptive language,  visuospatial abilities, and learning and memory. Cathy Fuller denied difficulties completing instrumental activities of daily living (ADLs) independently. ?  ?Across mood-related questionnaires, Cathy Fuller reported acute symptoms of moderate anxiety and mild depression. She also reported mild sleep dysfunction, likely related to longstanding untreated obstructive sleep apnea. Psychiatric distress and sleep dysfunction, either in combination or by themselves, can certainly influence cognitive efficiency and could explain [name]'s reported dysfunction in her day to day life. Based upon current test results, Cathy Fuller was able to learn novel verbal and visual information efficiently and retain this knowledge after lengthy delays. Overall, memory performance combined with intact performances across other areas of cognitive functioning is not suggestive of Alzheimer's disease. Likewise, her cognitive and behavioral profile is not suggestive of any other form of neurodegenerative illness presently. ?  ?Recommendations: ?Should Cathy Fuller experienced a decline in cognitive ability or functional status in the future, a repeat neuropsychological evaluation would be recommended at that time to assess the trajectory of future cognitive decline. The current evaluation will serve as an excellent baseline for comparison purposes if necessary.  ?  ?A combination of medication and psychotherapy has been shown to be most effective at treating symptoms of anxiety and depression. As such, Ms. Kien is encouraged to speak with her prescribing physician regarding medication adjustments to optimally manage these symptoms. Likewise, Cathy Fuller is encouraged to consider engaging in short-term psychotherapy to address symptoms of psychiatric distress. She would benefit from an active and collaborative therapeutic environment, rather than one purely supportive in nature. Recommended treatment modalities include Cognitive Behavioral  Therapy (CBT) or Acceptance and Commitment Therapy (ACT). ?  ?Ms.  Fuller is strongly encouraged to utilize her CPAP machine on a nightly basis when she receives an operational device. Untreated sleep apnea will worsen cognitive dysfunction. It will also increase her risk for stroke, heart attack, and dementia.  ?  ?Cathy Fuller is encouraged to attend to lifestyle factors for brain health (e.g., regular physical exercise, good nutrition habits, regular participation in cognitively-stimulating activities, and general stress management techniques), which are likely to have benefits for both emotional adjustment and cognition. In fact, in addition to promoting good general health, regular exercise incorporating aerobic activities (e.g., brisk walking, jogging, cycling, etc.) has been demonstrated to be a very effective treatment for depression and stress, with similar efficacy rates to both antidepressant medication and psychotherapy. Optimal control of vascular risk factors (including safe cardiovascular exercise and adherence to dietary recommendations) is encouraged. ?  ?When learning new information, she would benefit from information being broken up into small, manageable pieces. She may also find it helpful to articulate the material in her own words and in a context to promote encoding at the onset of a new task. This material may need to be repeated multiple times to promote encoding. ?  ?Memory can be improved using internal strategies such as rehearsal, repetition, chunking, mnemonics, association, and imagery. External strategies such as written notes in a consistently used memory journal, visual and nonverbal auditory cues such as a calendar on the refrigerator or appointments with alarm, such as on a cell phone, can also help maximize recall.   ?  ?To address problems with fluctuating attention, she may wish to consider: ?  -Avoiding external distractions when needing to concentrate ?  -Limiting exposure to  fast paced environments with multiple sensory demands ?  -Writing down complicated information and using checklists ?  -Attempting and completing one task at a time (i.e., no multi-tasking) ?  -Verbalizing aloud each step of a task to maintain focus ?  -Reducing the amount of information considered at one time>>  ? ?Today, 03/19/22: She reports intermittent pain in the left lower face area.  She reports that she had fallen in October and the pain started afterwards.  She sustained superficial injuries, required no stitches.  She did not have any follow-up with plastic surgery and no follow-up was needed.  She has noticed an improvement in her pain overall but lately, it has plateaued.  She tried gabapentin for about 2 weeks which caused side effects including dizziness, feeling off balance, blurry vision and when she stopped the gabapentin,  her side effects went away.  She was just recently started on an antidepressant medication but does not recall its name.  I reviewed the office visit note from [place], [place] on health from 01/30/2022.  The patient has been on Flexeril 10 mg strength 1 pill 3 times daily as needed.  She reports that she takes Flexeril just at bedtime typically.  She also takes Ultram 50 mg strength 3 times a day as needed.  Medication list also includes Mobic and the gabapentin was still on her list at the time.  She has been on Lexapro 10 mg strength 1 pill daily. ?Her face pain affects her about once or twice a week currently.  It starts in the medial left eyebrow area and radiates down to the midface area on the left and then to the back of her head. ?She had a recent eye examination and new eyeglasses.  Her blurry vision subsided.  As far as her AutoPap, she reports that  the headgear has bothered her since November.  We tried to get a download report but there was only 1 day of data visible online, start date was 06/28/2021 and that was only 1 day of usage that day.  No further dates show  any usage. ? ?She reports that she has 2 different mask, she preferred the nasal pillows.  She is encouraged to talk to her DME about different mask options that she might tolerate better. ?She has any othe

## 2022-03-19 NOTE — Patient Instructions (Addendum)
Your symptoms are not classic for what we call trigeminal neuralgia.  I am hopeful that your symptoms may improve over time.  Unfortunately, you had side effects with gabapentin.  You are already on an anti-inflammatory medication, muscle relaxant, tramadol and also an antidepressant medication.  I do not recommend any new medication for pain management at this time and we will proceed with a brain MRI with and without contrast to rule out a structural cause of your symptoms.  We will call you with the results.   ?Please try to be consistent with your AutoPap machine as treating sleep apnea may work as an adjunct to help your pain.  Please follow-up to see one of our nurse practitioners in sleep clinic in 2 to 3 months.  Please talk to your DME provider about trying a different mask that you may tolerate better. ?

## 2022-03-19 NOTE — Telephone Encounter (Signed)
Ross Ludwig, RN; Dimas Millin ?She is using a Furniture conservator/restorer and she hasn't been scanning her code.   ? ?  ?Previous Messages ?  ?----- Message -----  ?From: Guy Begin, RN  ?Sent: 03/19/2022   8:17 AM EDT  ?To: Wilford Sports  ?Subject: machine                                        ? ?Can you tell me what kind of machine pt has?   Pt is here for appt.  ? ?Sandy RN  ? ?

## 2022-03-26 ENCOUNTER — Telehealth: Payer: Self-pay | Admitting: Neurology

## 2022-03-26 NOTE — Telephone Encounter (Signed)
Mcarthur Rossetti Josem Kaufmann: [id number] (exp. 03/26/22 to 04/25/22)/[employer] no auth req ref # [id number] order sent to GI. They will reach out to the patient to schedule.  ?

## 2022-03-28 ENCOUNTER — Ambulatory Visit
Admission: RE | Admit: 2022-03-28 | Discharge: 2022-03-28 | Disposition: A | Discharge status: Home / Self Care | Payer: BLUE CROSS/BLUE SHIELD | Source: Ambulatory Visit | Attending: Neurology | Admitting: Neurology

## 2022-03-28 DIAGNOSIS — G4733 Obstructive sleep apnea (adult) (pediatric): Secondary | ICD-10-CM

## 2022-03-28 DIAGNOSIS — G501 Atypical facial pain: Secondary | ICD-10-CM

## 2022-03-28 MED ORDER — GADOBENATE DIMEGLUMINE 529 MG/ML IV SOLN
17.0000 mL | Freq: Once | INTRAVENOUS | Status: AC | PRN
  Administered 2022-03-28: 17 mL via INTRAVENOUS

## 2022-03-29 ENCOUNTER — Telehealth: Payer: Self-pay | Admitting: *Deleted

## 2022-03-29 NOTE — Telephone Encounter (Signed)
Relayed to pt that MRI face/ TN unremarkable.  I would relay to Dr. Frances Furbish that pt still with sporadic stabbing R above eyebrow shooting pain still there.  No change.  She continues on her meds that she takes for pain. No gabapentin.  SE have resolved when stop taking.

## 2022-06-20 ENCOUNTER — Ambulatory Visit: Payer: BLUE CROSS/BLUE SHIELD | Admitting: Neurology

## 2023-08-28 DIAGNOSIS — R079 Chest pain, unspecified: Secondary | ICD-10-CM | POA: Diagnosis not present

## 2024-01-21 ENCOUNTER — Encounter: Payer: Self-pay | Admitting: Physician Assistant

## 2024-03-09 NOTE — Progress Notes (Unsigned)
 Brigitte Canard, PA-C [address] [place], [place]  [address] Phone: [phone]   Gastroenterology Consultation  Referring Provider:     Aldine Humphreys, [place] Primary Care Physician:  Aldine Humphreys, [place] Primary Gastroenterologist:  Brigitte Canard, PA-C / Darol Elizabeth, MD  Reason for Consultation:     Discuss colonoscopy, dysphagia        HPI:   Cathy Fuller is a 63 y.o. y/o female referred for consultation & management  by Aldine Humphreys, PA.  Previous patient of Dr. Grandville Lax and Dr. Savannah Curlin.  Current Symptoms: She continues to have chronic solid food dysphagia.  Feels like food gets stuck in her throat and lower chest.  Foods goes down slowly.  She denies food bolus or vomiting episodes.  Heartburn has improved on pantoprazole  40 Mg nightly and famotidine  40 Mg every morning.  She still has occasional episodes of breakthrough heartburn.  She has chronic constipation.  Takes 1 stool softener daily with mild benefit.  She tried MiraLAX which increased diarrhea and discontinued.  She has intermittent episodes of epigastric and generalized abdominal pain which comes and goes.  Denies weight loss, melena, hematochezia, or alarm symptoms.  GI History:  Barrett's esophagus, eosinophilic esophagitis, history of esophageal stricture, fatty liver disease, NASH, hiatal hernia, GERD with esophagitis, and history of colon polyps.  Currently on famotidine  40 Mg daily  Other PMH: HTN, CKD 3, diabetes, hypothyroidism, chronic pain.  06/2020 colonoscopy by Dr. Savannah Curlin: Prep was inadequate.  Moderate stool in the rectum.  Repeat colonoscopy with extra 2-day prep is overdue.  06/2020 EGD by Dr. Savannah Curlin: 2 cm hiatal hernia.  Irregular Z-line, otherwise normal esophagus.  Empiric esophageal dilation performed to 18 mm.  Mild gastritis.  Normal duodenum.  Esophageal biopsies showed increased eosinophils, consistent with EOE.  No evidence of Barrett's.  Gastric biopsy showed chronic gastritis and  negative for H. pylori or metaplasia.  01/2014 EGD: mild non-obstructing benign appearing esophageal stricture that was dilated. Had a 2-3 cm hiatal hernia as well and irregular Z-line, but once again, biopsies did not show Barrett's.   05/2010 Colonoscopy: internal hemorrhoids; NO polyps.   01/2024 Labs: BUN 24, Cr 0.96, potassium 4.3, sodium 145, GFR 66.  Normal LFTs.  Glucose 185.  Normal TSH.  Past Medical History:  Diagnosis Date   Achilles tendinitis of right lower extremity 12/19/2018   Acquired short Achilles tendon of right lower extremity 12/19/2018   Anemia    as a teenager   Barrett's esophagus    Blood transfusion without reported diagnosis    Cataract    bilateral cateracts   Cervical radiculopathy 09/09/2018   CKD stage 3 due to type 2 diabetes mellitus 08/17/2018   Continue efforts to manage hypertension and hyperlipidemia. She is on Farxiga . Resume ARB if blood pressure remains elevated.   Complication of anesthesia    1978 hard time waking up after surgery and low blood pressure   Diabetes mellitus type 2, controlled, without complications 05/29/2010   Diabetes mellitus without complication (HCC)    Eosinophilic esophagitis 08/04/2020   By upper endoscopy. No Barrett's 07/2020.   Esophageal stricture    Fatty liver disease 05/29/2010   Hepatitis A 1983   Hiatal hernia with GERD and esophagitis 08/04/2020   EGD 07/2020 2 cm   History of colonic polyps 05/29/2010   History of kidney stones    yrs. ago   Hyperlipidemia associated with type 2 diabetes mellitus 01/17/2016   Await labs. Adjust regimen  as indicated by results. Goal LDL <70   Hypertension associated with chronic kidney disease due to type 2 diabetes mellitus 08/08/2018   Well controlled. Tolerated ARB, but caused mild hypotension. If remains elevated, would resume ARB, which she previously tolerated but was discontinued due   Hypothyroidism 01/30/2012   Has been stable. Await lab results.   IBS  (irritable bowel syndrome)    Insertional Achilles tendinopathy 01/12/2019   Internal hemorrhoids    Lumbar radiculopathy 08/04/2020   NASH (nonalcoholic steatohepatitis)    grade 1 fibrosis   Neuromuscular disorder    mild case of MG - per patient not MS   OSA (obstructive sleep apnea) 10/27/2013   CPAP machine is not currently working.  Needs new evaluation with sleep medicine.   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 Last Assessment & Plan:  Continue pain management with tramadol .  Last Assessment & Plan:  Formatting of this note might be different from the original. Chronic and stable.  Well managed with tramadol .   Stroke-like symptoms 1978   drug induced stroke    Past Surgical History:  Procedure Laterality Date   ABDOMINAL HYSTERECTOMY     ANTERIOR LAT LUMBAR FUSION Right 12/27/2015   Procedure: Right Sided Lumbar tw-three Lateral lumbar interbody fusion with lateral plate;  Surgeon: Raelene Bullocks Ditty, MD;  Location: [facility];  Service: Neurosurgery;  Laterality: Right;  Right Sided L2-3 Lateral lumbar interbody fusion with lateral plate   BACK SURGERY     2-3 fusion    BLADDER SURGERY     x 2   BREAST BIOPSY     CARPAL TUNNEL RELEASE Bilateral    CHOLECYSTECTOMY     CHOLECYSTECTOMY, LAPAROSCOPIC  1998   COLONOSCOPY     COLOSTOMY  2011   FOOT SURGERY     HEEL SPUR SURGERY Right 12/2018   [facility]   HERNIA REPAIR     KNEE SURGERY Left    LUMBAR FUSION     2017   REVERSE SHOULDER ARTHROPLASTY Left 06/10/2019   Procedure: LEFT REVERSE SHOULDER ARTHROPLASTY;  Surgeon: Janeth Medicus, MD;  Location: [facility];  Service: Orthopedics;  Laterality: Left;  2 hrs   SHOULDER SURGERY Left    x4 surgeries   TONSILLECTOMY     UPPER GASTROINTESTINAL ENDOSCOPY  2011    Prior to Admission medications   Medication Sig Start Date End Date Taking? Authorizing Provider  atorvastatin  (LIPITOR) 20 MG tablet Take 1 tablet (20 mg total) by mouth daily. Patient taking differently: Take 20 mg by mouth at bedtime. 08/13/17  Yes Jeffery, [name], PA  Cyanocobalamin (B-12 PO) Take by mouth as needed.   Yes [provider]  famotidine  (PEPCID ) 40 MG tablet Take 40 mg by mouth at bedtime.    Yes [provider]  glipiZIDE  (GLUCOTROL ) 10 MG tablet TAKE 1 TABLET THREE TIMES A DAY BEFORE MEALS Patient taking differently: Take 10 mg by mouth 3 (three) times daily before meals. 12/18/17  Yes Jeffery, Vertie Gosling, PA  insulin  regular human CONCENTRATED (HUMULIN R ) 500 UNIT/ML injection    Yes [provider]  levothyroxine  (SYNTHROID , LEVOTHROID) 25 MCG tablet Take 1 tablet (25 mcg total) by mouth daily. 08/13/17  Yes Jeffery, Vertie Gosling, PA  losartan  (COZAAR ) 50 MG tablet Take 50 mg by mouth daily.   Yes [provider]  meloxicam  (MOBIC ) 15 MG tablet Take 15 mg by mouth. 12/06/22  Yes [provider]  metoCLOPramide  (REGLAN ) 5 MG tablet TAKE 1 TABLET BY MOUTH BEFORE MEAL(S) AND AT BEDTIME 11/21/23  Yes [provider]  pantoprazole  (PROTONIX ) 40 MG tablet TAKE 1 TABLET BY MOUTH TWICE DAILY AT 6 AM AND 6 PM 01/21/24  Yes [provider]  polyethylene glycol (MIRALAX / GLYCOLAX) 17 g packet Take 17 g by mouth. 09/05/23  Yes [provider]  terbinafine (LAMISIL) 250 MG tablet Take 1 tablet by mouth daily.   Yes [provider]  traMADol  (ULTRAM ) 50 MG tablet Take 1 tablet (50 mg total) by mouth every 6 (six) hours as needed for moderate pain. 06/11/19  Yes Janeth Medicus, MD  dapagliflozin  propanediol (FARXIGA ) 5 MG TABS tablet Take  5 mg by mouth daily. Reported on 04/17/2016 Patient not taking: Reported on 03/10/2024 08/13/17   Aldine Humphreys, PA  Semaglutide, 2 MG/DOSE, 8 MG/3ML SOPN Inject into the skin. Patient not taking: Reported on 03/10/2024 01/26/22   [provider]  lisinopril (PRINIVIL,ZESTRIL) 10 MG tablet Take 10 mg by mouth daily.  01/30/12  [provider]     Family History  Problem Relation Age of Onset   Thyroid  disease Mother    Stroke Mother    Heart disease Mother    Hyperlipidemia Mother    Hypertension Mother    Dementia Mother    Stroke Father    Heart disease Father    Hyperlipidemia Father    Hypertension Father    Dementia Father    Diabetes Brother    Stroke Maternal Grandmother    Heart disease Maternal Grandfather        HEART ATTACK   Heart disease Paternal Grandfather        HEART ATTACK   Colitis Daughter    Colon polyps Daughter    Colon cancer Maternal Uncle    Esophageal cancer Neg Hx    Liver disease Neg Hx      Social History   Tobacco Use   Smoking status: Never   Smokeless tobacco: Never  Vaping Use   Vaping status: Never Used  Substance Use Topics   Alcohol use: Yes    Alcohol/week: 1.0 - 2.0 standard drink of alcohol    Types: 1 - 2 Standard drinks or equivalent per week    Comment: rare, wine or wine cooler   Drug use: No    Allergies as of 03/10/2024 - Review Complete 03/10/2024  Allergen Reaction Noted   Latex Other (See Comments) 11/07/2018   Tape Other (See Comments) 01/09/2012   Aspirin Tinitus     Review of Systems:    All systems reviewed and negative except where noted in HPI.   Physical Exam:  BP 118/80   Pulse 83   Ht 5\' 4"  (1.626 m)   Wt 186 lb (84.4 kg)   BMI 31.93 kg/m  No LMP recorded. Patient has had a hysterectomy.  General:   Alert,  Well-developed, well-nourished, pleasant and cooperative in NAD Lungs:  Respirations even and unlabored.  Clear throughout to auscultation.   No wheezes, crackles, or rhonchi.  No acute distress. Heart:  Regular rate and rhythm; no murmurs, clicks, rubs, or gallops.  Abdomen:  Normal bowel sounds.  No bruits.  Soft, and obese without masses, hepatosplenomegaly or hernias noted.  Mild Epigastric and Peri-umbilical Tenderness.  Rest of abdomen is not tender.  No guarding or rebound tenderness.    Neurologic:  Alert and oriented x3;  grossly normal neurologically. Psych:  Alert and cooperative. Normal mood and affect.  Imaging Studies: No results found.   Assessment and Plan:   Cathy Fuller is a 63 y.o. y/o female has been referred for   1.  Chronic GERD with esophagitis - Continue Pepcid /famotidine  40 Mg daily - Increase Pantoprazole  to 40mg  twice daily for 3 months. -Recommend Lifestyle Modifications to prevent Acid Reflux.  Rec. Avoid coffee, sodas, peppermint, garlic, onions, alcohol, citrus fruits, chocolate, tomatoes, fatty and spicey foods.  Avoid eating 2-3 hours before bedtime.    2.  Eosinophilic esophagitis - Increase Pantoprazole  to 40mg  BID, #60, x 2 RF (for 3 months). -If dysphagia persists on high dose PPI, then Rx Budesonide slurry. -If still having dysphagia, then try Dupixent.  3.  History of esophageal stricture / Persistent Dysphagia -Scheduling EGD I discussed risks of EGD with patient to include risk of bleeding, perforation, and risk of sedation.  Patient expressed understanding and agrees to proceed with EGD.   4.  History of adenomatous colon polyps; last colonoscopy in 2021 showed inadequate prep. -Scheduling Colonoscopy I discussed risks of colonoscopy with patient to include risk of bleeding, colon perforation, and risk of sedation.  Patient expressed understanding and agrees to proceed with colonoscopy.  - She was given 2-day prep instructions.  5.  Constipation - Start OTC Magnesium  400mg  1 tablet once daily to help with constipation and leg cramps. -Gave samples of Linzess 72 mcg QD for 1 week, then 145 mcg QD for 1 week.  She  will let me know which dose works best, and then we can send a prescription.   -High Fiber diet.  Drink 64 ounces of water  daily.  Follow up 4 weeks after EGD / Colon with TG to f/u Constipation and EOE.  Brigitte Canard, PA-C

## 2024-03-10 ENCOUNTER — Ambulatory Visit (INDEPENDENT_AMBULATORY_CARE_PROVIDER_SITE_OTHER): Admitting: Physician Assistant

## 2024-03-10 ENCOUNTER — Ambulatory Visit: Admitting: Physician Assistant

## 2024-03-10 ENCOUNTER — Encounter: Payer: Self-pay | Admitting: Physician Assistant

## 2024-03-10 VITALS — BP 118/80 | HR 83 | Ht 64.0 in | Wt 186.0 lb

## 2024-03-10 DIAGNOSIS — K219 Gastro-esophageal reflux disease without esophagitis: Secondary | ICD-10-CM

## 2024-03-10 DIAGNOSIS — Z860100 Personal history of colon polyps, unspecified: Secondary | ICD-10-CM

## 2024-03-10 DIAGNOSIS — K200 Eosinophilic esophagitis: Secondary | ICD-10-CM | POA: Diagnosis not present

## 2024-03-10 DIAGNOSIS — R1310 Dysphagia, unspecified: Secondary | ICD-10-CM | POA: Diagnosis not present

## 2024-03-10 DIAGNOSIS — Z860101 Personal history of adenomatous and serrated colon polyps: Secondary | ICD-10-CM

## 2024-03-10 DIAGNOSIS — Z8719 Personal history of other diseases of the digestive system: Secondary | ICD-10-CM

## 2024-03-10 DIAGNOSIS — K5904 Chronic idiopathic constipation: Secondary | ICD-10-CM

## 2024-03-10 DIAGNOSIS — K5900 Constipation, unspecified: Secondary | ICD-10-CM

## 2024-03-10 MED ORDER — NA SULFATE-K SULFATE-MG SULF 17.5-3.13-1.6 GM/177ML PO SOLN: 1.0000 | Freq: Once | ORAL | 0 refills | Status: AC

## 2024-03-10 MED ORDER — PANTOPRAZOLE SODIUM 40 MG PO TBEC: 40.0000 mg | DELAYED_RELEASE_TABLET | Freq: Two times a day (BID) | ORAL | 2 refills | Status: AC

## 2024-03-10 NOTE — Patient Instructions (Addendum)
 Start OTC Magnesium  400mg  1 tablet once daily to help with constipation and leg cramps.  For Constipation Try: Samples of Linzess 72 mcg QD for 1 week,  then 145 mcg QD for 1 week,  Please let me know which dose works best, and then we can send in a prescription.    Increase Pantoprazole  40mg  to 1 tablet twice daily to help with GERD and Eosinophilic Esophagitis.  We have sent the following medications to your pharmacy for you to pick up at your convenience: pantoprazole  and suprep  You have been scheduled for an endoscopy and colonoscopy. Please follow the written instructions given to you at your visit today.  If you use inhalers (even only as needed), please bring them with you on the day of your procedure.  DO NOT TAKE 7 DAYS PRIOR TO TEST- Trulicity (dulaglutide) Ozempic, Wegovy (semaglutide) Mounjaro (tirzepatide) Bydureon Bcise (exanatide extended release)  DO NOT TAKE 1 DAY PRIOR TO YOUR TEST Rybelsus (semaglutide) Adlyxin (lixisenatide) Victoza (liraglutide) Byetta (exanatide) ___________________________________________________________________________  _______________________________________________________  If your blood pressure at your visit was 140/90 or greater, please contact your primary care physician to follow up on this.  _______________________________________________________  If you are age 29 or older, your body mass index should be between 23-30. Your Body mass index is 31.93 kg/m. If this is out of the aforementioned range listed, please consider follow up with your Primary Care Provider.  If you are age 37 or younger, your body mass index should be between 19-25. Your Body mass index is 31.93 kg/m. If this is out of the aformentioned range listed, please consider follow up with your Primary Care Provider.   ________________________________________________________  The  GI providers would like to encourage you to use MYCHART to communicate with  providers for non-urgent requests or questions.  Due to long hold times on the telephone, sending your provider a message by [facility] may be a faster and more efficient way to get a response.  Please allow 48 business hours for a response.  Please remember that this is for non-urgent requests.  _______________________________________________________

## 2024-03-27 NOTE — Progress Notes (Signed)
 Agree with the assessment and plan as outlined by Brigitte Canard, PA-C.

## 2024-04-27 ENCOUNTER — Telehealth: Payer: Self-pay | Admitting: Gastroenterology

## 2024-04-27 NOTE — Telephone Encounter (Signed)
 Good morning Dr. Cherryl Corona,   Patient wished to reschedule procedures scheduled for tomorrow 6/17 due to not completing the prep. Patient has been rescheduled to 8/12. Please advise.   Thank you

## 2024-04-28 ENCOUNTER — Encounter: Admitting: Gastroenterology

## 2024-06-09 ENCOUNTER — Ambulatory Visit (AMBULATORY_SURGERY_CENTER)

## 2024-06-09 VITALS — Ht 64.0 in | Wt 184.0 lb

## 2024-06-09 DIAGNOSIS — K200 Eosinophilic esophagitis: Secondary | ICD-10-CM

## 2024-06-09 DIAGNOSIS — Z860100 Personal history of colon polyps, unspecified: Secondary | ICD-10-CM

## 2024-06-09 DIAGNOSIS — R1310 Dysphagia, unspecified: Secondary | ICD-10-CM

## 2024-06-09 DIAGNOSIS — K5904 Chronic idiopathic constipation: Secondary | ICD-10-CM

## 2024-06-09 DIAGNOSIS — K219 Gastro-esophageal reflux disease without esophagitis: Secondary | ICD-10-CM

## 2024-06-09 NOTE — Progress Notes (Signed)
 No egg or soy allergy known to patient  No issues known to pt with past sedation with any surgeries or procedures Patient denies ever being told they had issues or difficulty with intubation  No FH of Malignant Hyperthermia Pt is not on diet pills Pt is not on  home 02  Pt is not on blood thinners  Pt denies issues with constipation  No A fib or A flutter Have any cardiac testing pending-- no  LOA: independent  Prep: suprep - 2 day   Patient's chart reviewed by Norleen Schillings CNRA prior to previsit and patient appropriate for the [facility].  Previsit completed and red dot placed by patient's name on their procedure day (on provider's schedule).     PV completed with patient. Prep instructions sent via mychart and home address.

## 2024-06-10 ENCOUNTER — Encounter: Payer: Self-pay | Admitting: Gastroenterology

## 2024-06-23 ENCOUNTER — Encounter: Payer: Self-pay | Admitting: Gastroenterology

## 2024-06-23 ENCOUNTER — Ambulatory Visit (AMBULATORY_SURGERY_CENTER): Admitting: Gastroenterology

## 2024-06-23 VITALS — BP 156/68 | HR 74 | Temp 97.7°F | Resp 15 | Ht 64.0 in | Wt 184.0 lb

## 2024-06-23 DIAGNOSIS — K449 Diaphragmatic hernia without obstruction or gangrene: Secondary | ICD-10-CM

## 2024-06-23 DIAGNOSIS — Z860100 Personal history of colon polyps, unspecified: Secondary | ICD-10-CM | POA: Diagnosis not present

## 2024-06-23 DIAGNOSIS — K3189 Other diseases of stomach and duodenum: Secondary | ICD-10-CM | POA: Diagnosis not present

## 2024-06-23 DIAGNOSIS — K2950 Unspecified chronic gastritis without bleeding: Secondary | ICD-10-CM | POA: Diagnosis not present

## 2024-06-23 DIAGNOSIS — E039 Hypothyroidism, unspecified: Secondary | ICD-10-CM | POA: Diagnosis not present

## 2024-06-23 DIAGNOSIS — D122 Benign neoplasm of ascending colon: Secondary | ICD-10-CM | POA: Diagnosis not present

## 2024-06-23 DIAGNOSIS — Z1211 Encounter for screening for malignant neoplasm of colon: Secondary | ICD-10-CM

## 2024-06-23 DIAGNOSIS — K760 Fatty (change of) liver, not elsewhere classified: Secondary | ICD-10-CM | POA: Diagnosis not present

## 2024-06-23 DIAGNOSIS — Q438 Other specified congenital malformations of intestine: Secondary | ICD-10-CM

## 2024-06-23 DIAGNOSIS — R1319 Other dysphagia: Secondary | ICD-10-CM

## 2024-06-23 DIAGNOSIS — K8689 Other specified diseases of pancreas: Secondary | ICD-10-CM

## 2024-06-23 DIAGNOSIS — K229 Disease of esophagus, unspecified: Secondary | ICD-10-CM | POA: Diagnosis not present

## 2024-06-23 DIAGNOSIS — D128 Benign neoplasm of rectum: Secondary | ICD-10-CM | POA: Diagnosis not present

## 2024-06-23 DIAGNOSIS — K219 Gastro-esophageal reflux disease without esophagitis: Secondary | ICD-10-CM

## 2024-06-23 DIAGNOSIS — R1310 Dysphagia, unspecified: Secondary | ICD-10-CM | POA: Diagnosis not present

## 2024-06-23 DIAGNOSIS — K222 Esophageal obstruction: Secondary | ICD-10-CM | POA: Diagnosis not present

## 2024-06-23 DIAGNOSIS — E119 Type 2 diabetes mellitus without complications: Secondary | ICD-10-CM | POA: Diagnosis not present

## 2024-06-23 MED ORDER — SODIUM CHLORIDE 0.9 % IV SOLN: 500.0000 mL | Freq: Once | INTRAVENOUS | Status: DC

## 2024-06-23 NOTE — Progress Notes (Signed)
 Informed patient to PLEASE CALL BACK in 3 MONTHS TO SCHEDULE REPEAT COLONOSCOPY.  SCHEDULE IS NOT OUT IN ADVANCE 6-9 MONTHS.

## 2024-06-23 NOTE — Progress Notes (Signed)
 Called to room to assist during endoscopic procedure.  Patient ID and intended procedure confirmed with present staff. Received instructions for my participation in the procedure from the performing physician.

## 2024-06-23 NOTE — Patient Instructions (Addendum)
 Educational handout provided to patient related  to Polyps & Hiatal hernia  Resume previous diet  Continue present medications  Awaiting pathology results  PLEASE CALL BACK in 3 MONTHS TO SCHEDULE REPEAT COLONOSCOPY.  SCHEDULE IS NOT OUT IN ADVANCE 6-9 MONTHS.   YOU HAD AN ENDOSCOPIC PROCEDURE TODAY AT THE [facility]:   Refer to the procedure report that was given to you for any specific questions about what was found during the examination.  If the procedure report does not answer your questions, please call your gastroenterologist to clarify.  If you requested that your care partner not be given the details of your procedure findings, then the procedure report has been included in a sealed envelope for you to review at your convenience later.  YOU SHOULD EXPECT: Some feelings of bloating in the abdomen. Passage of more gas than usual.  Walking can help get rid of the air that was put into your GI tract during the procedure and reduce the bloating. If you had a lower endoscopy (such as a colonoscopy or flexible sigmoidoscopy) you may notice spotting of blood in your stool or on the toilet paper. If you underwent a bowel prep for your procedure, you may not have a normal bowel movement for a few days.  Please Note:  You might notice some irritation and congestion in your nose or some drainage.  This is from the oxygen used during your procedure.  There is no need for concern and it should clear up in a day or so.  SYMPTOMS TO REPORT IMMEDIATELY:  Following lower endoscopy (colonoscopy or flexible sigmoidoscopy):  Excessive amounts of blood in the stool  Significant tenderness or worsening of abdominal pains  Swelling of the abdomen that is new, acute  Fever of 100F or higher  Following upper endoscopy (EGD)  Vomiting of blood or coffee ground material  New chest pain or pain under the shoulder blades  Painful or persistently difficult swallowing  New shortness of  breath  Fever of 100F or higher  Black, tarry-looking stools  For urgent or emergent issues, a gastroenterologist can be reached at any hour by calling [phone]. Do not use MyChart messaging for urgent concerns.    DIET:  We do recommend a small meal at first, but then you may proceed to your regular diet.  Drink plenty of fluids but you should avoid alcoholic beverages for 24 hours.  ACTIVITY:  You should plan to take it easy for the rest of today and you should NOT DRIVE or use heavy machinery until tomorrow (because of the sedation medicines used during the test).    FOLLOW UP: Our staff will call the number listed on your records the next business day following your procedure.  We will call around 7:15- 8:00 am to check on you and address any questions or concerns that you may have regarding the information given to you following your procedure. If we do not reach you, we will leave a message.     If any biopsies were taken you will be contacted by phone or by letter within the next 1-3 weeks.  Please call us  at [phone] if you have not heard about the biopsies in 3 weeks.    SIGNATURES/CONFIDENTIALITY: You and/or your care partner have signed paperwork which will be entered into your electronic medical record.  These signatures attest to the fact that that the information above on your After Visit Summary has been reviewed and is understood.  Full responsibility of the confidentiality of this discharge information lies with you and/or your care-partner.

## 2024-06-23 NOTE — Op Note (Signed)
 [facility] Patient Name: Cathy Fuller Procedure Date: 06/23/2024 8:57 AM MRN: [id number] Endoscopist: Glendia E. Stacia , MD, [id number] Age: 63 Referring MD:  Date of Birth: 06/26/1961 Gender: Female Account #: [id number] Procedure:                Colonoscopy Indications:              Screening for colorectal malignant neoplasm, Last                            colonoscopy: August 2021 Medicines:                Monitored Anesthesia Care Procedure:                Pre-Anesthesia Assessment:                           - Prior to the procedure, a History and Physical                            was performed, and patient medications and                            allergies were reviewed. The patient's tolerance of                            previous anesthesia was also reviewed. The risks                            and benefits of the procedure and the sedation                            options and risks were discussed with the patient.                            All questions were answered, and informed consent                            was obtained. Prior Anticoagulants: The patient has                            taken no anticoagulant or antiplatelet agents. ASA                            Grade Assessment: II - A patient with mild systemic                            disease. After reviewing the risks and benefits,                            the patient was deemed in satisfactory condition to                            undergo the procedure.  After obtaining informed consent, the colonoscope                            was passed under direct vision. Throughout the                            procedure, the patient's blood pressure, pulse, and                            oxygen saturations were monitored continuously. The                            CF [id number] #[id number] was introduced through the anus                            and advanced to the the  cecum, identified by                            appendiceal orifice and ileocecal valve. The                            colonoscopy was somewhat difficult due to a fixed                            and tortuous sigmoid colon. Successful completion                            of the procedure was aided by changing the patient                            to a supine position, using manual pressure and                            withdrawing the scope and replacing with the                            pediatric endoscope. The patient tolerated the                            procedure well. The quality of the bowel                            preparation was adequate. The ileocecal valve,                            appendiceal orifice, and rectum were photographed.                            The bowel preparation used was SUPREP via split                            dose instruction. Scope In: 9:37:51 AM Scope Out: 10:18:43 AM Scope Withdrawal Time: 0 hours 24 minutes 26 seconds  Total  Procedure Duration: 0 hours 40 minutes 52 seconds  Findings:                 The perianal and digital rectal examinations were                            normal. Pertinent negatives include normal                            sphincter tone and no palpable rectal lesions.                           A 20 mm polyp was found in the ascending colon,                            just distal to the ICV. The polyp was sessile. The                            polyp was removed with a saline injection-lift                            technique in a piecemeal fashion using a cold                            snare. Resection and retrieval were complete.                            Estimated blood loss was minimal.                           A 5 mm polyp was found in the ascending colon. The                            polyp was sessile. The polyp was removed with a                            cold snare. Resection and retrieval were  complete.                            Estimated blood loss was minimal.                           A 4 mm polyp was found in the rectum distal rectum.                            The polyp was sessile. The polyp was removed with a                            cold snare. Resection and retrieval were complete.                            Estimated blood loss was minimal.  The exam was otherwise normal throughout the                            examined colon.                           The retroflexed view of the distal rectum and anal                            verge was normal and showed no anal or rectal                            abnormalities. Complications:            No immediate complications. Estimated Blood Loss:     Estimated blood loss was minimal. Impression:               - One 20 mm polyp in the ascending colon, removed                            piecemeal using injection-lift and a cold snare.                            Resected and retrieved.                           - One 5 mm polyp in the ascending colon, removed                            with a cold snare. Resected and retrieved.                           - One 4 mm polyp in the rectum in the distal                            rectum, removed with a cold snare. Resected and                            retrieved.                           - The distal rectum and anal verge are normal on                            retroflexion view. Recommendation:           - Patient has a contact number available for                            emergencies. The signs and symptoms of potential                            delayed complications were discussed with the                            patient.  Return to normal activities tomorrow.                            Written discharge instructions were provided to the                            patient.                           - Resume previous diet.                            - Continue present medications.                           - Await pathology results.                           - Repeat colonoscopy in 6-9 months for surveillance                            after piecemeal polypectomy. Esterlene Atiyeh E. Stacia, MD 06/23/2024 10:37:42 AM This report has been signed electronically.

## 2024-06-23 NOTE — Progress Notes (Signed)
 Pt's states no medical or surgical changes since previsit or office visit.

## 2024-06-23 NOTE — Progress Notes (Signed)
 A/o x 3, VSS, gd SR's, pleased with anesthesia, report to RN

## 2024-06-23 NOTE — Progress Notes (Signed)
 [facility] History and Physical   Primary Care Physician:  Juliane Che, PA   Reason for Procedure:   Dysphagia, known EoE; colon cancer screening  Plan:    EGD, colonoscopy     HPI: Cathy Fuller is a 63 y.o. female undergoing EGD to evaluate dysphagia and suspected EoE.  She had an EGD in 2021 with biopsies showing elevated eosinophils in the distal and proximal esophagus.  Esophagus empirically dilated to 18 mm.  She takes protonix  and famotidine  but has ongoing dysphagia.  She is also undergoing average risk screening colonoscopy.  She had a normal colonoscopy in 2011.  Repeat colonoscopy in 2021 limited by poor prep.    Past Medical History:  Diagnosis Date   Achilles tendinitis of right lower extremity 12/19/2018   Acquired short Achilles tendon of right lower extremity 12/19/2018   Anemia    as a teenager   Barrett's esophagus    Blood transfusion without reported diagnosis    Cataract    bilateral cateracts   Cervical radiculopathy 09/09/2018   CKD stage 3 due to type 2 diabetes mellitus 08/17/2018   Continue efforts to manage hypertension and hyperlipidemia. She is on Farxiga . Resume ARB if blood pressure remains elevated.   Complication of anesthesia    1978 hard time waking up after surgery and low blood pressure   Diabetes mellitus type 2, controlled, without complications 05/29/2010   Diabetes mellitus without complication (HCC)    Eosinophilic esophagitis 08/04/2020   By upper endoscopy. No Barrett's 07/2020.   Esophageal stricture    Fatty liver disease 05/29/2010   Hepatitis A 1983   Hiatal hernia with GERD and esophagitis 08/04/2020   EGD 07/2020 2 cm   History of colonic polyps 05/29/2010   History of kidney stones    yrs. ago   Hyperlipidemia associated with type 2 diabetes mellitus 01/17/2016   Await labs. Adjust regimen as indicated by results. Goal LDL <70   Hypertension associated with chronic kidney disease due to type 2 diabetes  mellitus 08/08/2018   Well controlled. Tolerated ARB, but caused mild hypotension. If remains elevated, would resume ARB, which she previously tolerated but was discontinued due   Hypothyroidism 01/30/2012   Has been stable. Await lab results.   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  Needs new evaluation with sleep medicine.   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 Last Assessment & Plan:  Continue pain management with tramadol .  Last Assessment & Plan:  Formatting of this note might be different from the original. Chronic and stable.  Well managed with tramadol .   Stroke-like symptoms 1978   drug induced stroke    Past Surgical History:  Procedure Laterality Date   ABDOMINAL HYSTERECTOMY     ANTERIOR LAT LUMBAR FUSION Right 12/27/2015   Procedure:  Right Sided Lumbar tw-three Lateral lumbar interbody fusion with lateral plate;  Surgeon: Morene Hicks Ditty, MD;  Location: [facility];  Service: Neurosurgery;  Laterality: Right;  Right Sided L2-3 Lateral lumbar interbody fusion with lateral plate   BACK SURGERY     2-3 fusion    BLADDER SURGERY     x 2   BREAST BIOPSY     CARPAL TUNNEL RELEASE Bilateral    CHOLECYSTECTOMY     CHOLECYSTECTOMY, LAPAROSCOPIC  1998   COLONOSCOPY     COLOSTOMY  2011   FOOT  SURGERY     HEEL SPUR SURGERY Right 12/2018   lengthing of [place]   HERNIA REPAIR     KNEE SURGERY Left    LUMBAR FUSION     2017   REVERSE SHOULDER ARTHROPLASTY Left 06/10/2019   Procedure: LEFT REVERSE SHOULDER ARTHROPLASTY;  Surgeon: Sharl Selinda Dover, MD;  Location: [facility] OR;  Service: Orthopedics;  Laterality: Left;  2 hrs   SHOULDER SURGERY Left    x4 surgeries   TONSILLECTOMY     UPPER GASTROINTESTINAL ENDOSCOPY  2011    Prior to Admission medications   Medication Sig Start Date End Date Taking? Authorizing Provider  atorvastatin  (LIPITOR) 80 MG tablet Take 80 mg by mouth daily.   Yes [provider]  bisacodyl  (DULCOLAX) 5 MG EC tablet Take 10 mg by mouth daily as needed. 09/05/23  Yes [provider]  Docusate Sodium  (DSS) 100 MG CAPS Take 1 capsule by mouth at bedtime. 09/06/23  Yes [provider]  glipiZIDE  (GLUCOTROL ) 10 MG tablet TAKE 1 TABLET THREE TIMES A DAY BEFORE MEALS Patient taking differently: Take 10 mg by mouth 3 (three) times daily before meals. 12/18/17  Yes Jeffery, [name], PA  LANTUS SOLOSTAR 100 UNIT/ML Solostar Pen INJECT UNDER THE SKIN 28 UNITS DAILY; INCREASE BY 2 UNITS FOR GLUCOSE ABOVE 200 AS DIRECTED 03/30/24  Yes [provider]  levothyroxine  (SYNTHROID , LEVOTHROID) 25 MCG tablet Take 1 tablet (25 mcg total) by mouth daily. 08/13/17  Yes Jeffery, Bernita, PA  losartan  (COZAAR ) 50 MG tablet Take 50 mg by mouth daily.   Yes [provider]  metoCLOPramide  (REGLAN ) 5 MG tablet TAKE 1 TABLET BY MOUTH BEFORE MEAL(S) AND AT BEDTIME Patient taking differently: Take 5 mg by mouth as needed. 11/21/23  Yes [provider]  pantoprazole  (PROTONIX ) 40 MG tablet Take 1 tablet (40 mg total) by mouth 2 (two) times daily. 03/10/24  Yes [place], PA-C  polyethylene glycol (MIRALAX / GLYCOLAX) 17 g packet Take 17 g by mouth. 09/05/23  Yes [provider]  Cyanocobalamin (B-12 PO) Take by mouth as  needed. Patient not taking: No sig reported    [provider]  cyclobenzaprine  (FLEXERIL ) 10 MG tablet Take 1 tablet by mouth 3 (three) times daily as needed. 01/28/23   [provider]  famotidine  (PEPCID ) 40 MG tablet Take 40 mg by mouth at bedtime.     [provider]  HUMULIN R  U-500 KWIKPEN 500 UNIT/ML KwikPen CHECK GLUCOSE BEFORE MEALS AND AT BEDTIME. IF LESS THAN 150, GIVE NO UNITS. 151-199, GIVE 2 UNIT.200-249, 4 UNITS. 250-299, 6 UNITS. 301-349, 8 UNITS. 350-399, 10 UNITS. 400 OR HIGHER, 12 UNITS AND RECHECK EVERY 4 HOURS. MAX 72 UNITS IN 24 HOURS    [provider]  ketoconazole  (NIZORAL ) 2 % cream Apply 1 Application topically daily as needed. 12/06/22   [provider]  lidocaine  (XYLOCAINE ) 2 % solution Use as directed 15  mLs in the mouth or throat as needed. 05/29/24   [provider]  meloxicam  (MOBIC ) 15 MG tablet Take 15 mg by mouth. Patient not taking: No sig reported 12/06/22   [provider]  traMADol  (ULTRAM ) 50 MG tablet Take 1 tablet (50 mg total) by mouth every 6 (six) hours as needed for moderate pain. 06/11/19   Sharl Selinda Dover, MD  lisinopril (PRINIVIL,ZESTRIL) 10 MG tablet Take 10 mg by mouth daily.  01/30/12  [provider]    Current Outpatient Medications  Medication Sig Dispense Refill   atorvastatin  (LIPITOR) 80 MG tablet Take 80 mg by mouth daily.     bisacodyl  (DULCOLAX) 5 MG EC tablet Take 10 mg by mouth daily as needed.     Docusate Sodium  (DSS) 100 MG CAPS Take 1 capsule by mouth at bedtime.     glipiZIDE  (GLUCOTROL ) 10 MG tablet TAKE 1 TABLET THREE TIMES A DAY BEFORE MEALS (Patient taking differently: Take 10 mg by mouth 3 (three) times daily before meals.) 270 tablet 3   LANTUS SOLOSTAR 100 UNIT/ML Solostar Pen INJECT UNDER THE SKIN 28 UNITS DAILY; INCREASE BY 2 UNITS FOR GLUCOSE ABOVE 200 AS DIRECTED     levothyroxine  (SYNTHROID , LEVOTHROID) 25 MCG tablet Take 1 tablet (25 mcg  total) by mouth daily. 90 tablet 3   losartan  (COZAAR ) 50 MG tablet Take 50 mg by mouth daily.     metoCLOPramide  (REGLAN ) 5 MG tablet TAKE 1 TABLET BY MOUTH BEFORE MEAL(S) AND AT BEDTIME (Patient taking differently: Take 5 mg by mouth as needed.)     pantoprazole  (PROTONIX ) 40 MG tablet Take 1 tablet (40 mg total) by mouth 2 (two) times daily. 60 tablet 2   polyethylene glycol (MIRALAX / GLYCOLAX) 17 g packet Take 17 g by mouth.     Cyanocobalamin (B-12 PO) Take by mouth as needed. (Patient not taking: No sig reported)     cyclobenzaprine  (FLEXERIL ) 10 MG tablet Take 1 tablet by mouth 3 (three) times daily as needed.     famotidine  (PEPCID ) 40 MG tablet Take 40 mg by mouth at bedtime.      HUMULIN R  U-500 KWIKPEN 500 UNIT/ML KwikPen CHECK GLUCOSE BEFORE MEALS AND AT BEDTIME. IF LESS THAN 150, GIVE NO UNITS. 151-199, GIVE 2 UNIT.200-249, 4 UNITS. 250-299, 6 UNITS. 301-349, 8 UNITS. 350-399, 10 UNITS. 400 OR HIGHER, 12 UNITS AND RECHECK EVERY 4 HOURS. MAX 72 UNITS IN 24 HOURS     ketoconazole  (NIZORAL ) 2 % cream Apply 1 Application topically daily as needed.     lidocaine  (XYLOCAINE ) 2 % solution Use as directed 15 mLs in the mouth or throat as needed.     meloxicam  (MOBIC ) 15 MG tablet Take 15 mg by mouth. (Patient not taking: No sig reported)     traMADol  (ULTRAM ) 50 MG tablet Take 1 tablet (50 mg total) by mouth every 6 (six) hours as needed for moderate pain. 40 tablet 0   Current Facility-Administered Medications  Medication Dose Route Frequency Provider Last Rate Last Admin   0.9 %  sodium chloride  infusion  500 mL Intravenous Once Stacia Glendia BRAVO, MD        Allergies as of 06/23/2024 - Review Complete 06/23/2024  Allergen Reaction Noted   Aspirin Tinitus, Other (See Comments), and Palpitations 11/02/2015   Latex Other (See Comments), Dermatitis, and Itching 11/07/2018   Tape Other (See Comments) 01/09/2012   Wound dressing adhesive Dermatitis, Itching, and Other (See Comments)  01/09/2012   Gabapentin Other (  See Comments) 04/25/2022   Lisinopril Cough 03/08/2021   Metformin  and related Diarrhea 03/08/2021    Family History  Problem Relation Age of Onset   Thyroid  disease Mother    Stroke Mother    Heart disease Mother    Hyperlipidemia Mother    Hypertension Mother    Dementia Mother    Stroke Father    Heart disease Father    Hyperlipidemia Father    Hypertension Father    Dementia Father    Diabetes Brother    Stroke Maternal Grandmother    Heart disease Maternal Grandfather        HEART ATTACK   Heart disease Paternal Grandfather        HEART ATTACK   Colitis Daughter    Colon polyps Daughter    Colon cancer Maternal Uncle    Esophageal cancer Neg Hx    Liver disease Neg Hx     Social History   Socioeconomic History   Marital status: Married    Spouse name: Not on file   Number of children: 2   Years of education: 12   Highest education level: High school graduate  Occupational History   Occupation: Disabled    Comment: disabled  Tobacco Use   Smoking status: Never   Smokeless tobacco: Never  Vaping Use   Vaping status: Never Used  Substance and Sexual Activity   Alcohol use: Yes    Alcohol/week: 1.0 - 2.0 standard drink of alcohol    Types: 1 - 2 Standard drinks or equivalent per week    Comment: rare, wine or wine cooler   Drug use: No   Sexual activity: Not on file  Other Topics Concern   Not on file  Social History Narrative   Lives at home with husband and son.     Social Drivers of Health   Financial Resource Strain: Medium Risk (03/19/2024)   Received from [facility]   Overall Financial Resource Strain (CARDIA)    Difficulty of Paying Living Expenses: Somewhat hard  Food Insecurity: Food Insecurity Present (03/19/2024)   Received from [facility]   Hunger Vital Sign    Within the past 12 months, you worried that your food would run out before you got the money to buy more.: Patient declined    Within the past  12 months, the food you bought just didn't last and you didn't have money to get more.: Sometimes true  Transportation Needs: Unmet Transportation Needs (03/19/2024)   Received from [facility] - Transportation    Lack of Transportation (Medical): Yes    Lack of Transportation (Non-Medical): No  Physical Activity: Sufficiently Active (03/19/2024)   Received from [facility]   Exercise Vital Sign    On average, how many days per week do you engage in moderate to strenuous exercise (like a brisk walk)?: 3 days    On average, how many minutes do you engage in exercise at this level?: 60 min  Stress: No Stress Concern Present (03/19/2024)   Received from [facility] - Occupational Stress Questionnaire    Feeling of Stress : Only a little  Social Connections: Moderately Integrated (03/19/2024)   Received from [facility]   Social Network    How would you rate your social network (family, work, friends)?: Adequate participation with social networks  Intimate Partner Violence: Not At Risk (03/19/2024)   Received from [facility]   HITS  Over the last 12 months how often did your partner physically hurt you?: Never    Over the last 12 months how often did your partner insult you or talk down to you?: Never    Over the last 12 months how often did your partner threaten you with physical harm?: Never    Over the last 12 months how often did your partner scream or curse at you?: Never    Review of Systems:  All other review of systems negative except as mentioned in the HPI.  Physical Exam: Vital signs BP 136/72   Pulse 87   Temp 97.7 F (36.5 C)   Ht 5' 4 (1.626 m)   Wt 184 lb (83.5 kg)   SpO2 98%   BMI 31.58 kg/m   General:   Alert,  Well-developed, well-nourished, pleasant and cooperative in NAD Airway:  Mallampati 2 Lungs:  Clear throughout to auscultation.   Heart:  Regular rate and rhythm; no murmurs, clicks, rubs,  or  gallops. Abdomen:  Soft, nontender and nondistended. Normal bowel sounds.   Neuro/Psych:  Normal mood and affect. A and O x 3   Bowen Goyal E. Stacia, MD [facility]

## 2024-06-23 NOTE — Op Note (Signed)
 [facility] Patient Name: Cathy Fuller Procedure Date: 06/23/2024 9:09 AM MRN: [id number] Endoscopist: Glendia E. Stacia , MD, [id number] Age: 63 Referring MD:  Date of Birth: [birth year] Gender: Female Account #: [id number] Procedure:                Upper GI endoscopy Indications:              Dysphagia, Exclusion of eosinophilic esophagitis Medicines:                Monitored Anesthesia Care Procedure:                Pre-Anesthesia Assessment:                           - Prior to the procedure, a History and Physical                            was performed, and patient medications and                            allergies were reviewed. The patient's tolerance of                            previous anesthesia was also reviewed. The risks                            and benefits of the procedure and the sedation                            options and risks were discussed with the patient.                            All questions were answered, and informed consent                            was obtained. Prior Anticoagulants: The patient has                            taken no anticoagulant or antiplatelet agents. ASA                            Grade Assessment: II - A patient with mild systemic                            disease. After reviewing the risks and benefits,                            the patient was deemed in satisfactory condition to                            undergo the procedure.                           After obtaining informed consent, the endoscope was  passed under direct vision. Throughout the                            procedure, the patient's blood pressure, pulse, and                            oxygen saturations were monitored continuously. The                            Olympus Scope [id number] was introduced through the                            mouth, and advanced to the second part of duodenum.                             The upper GI endoscopy was accomplished without                            difficulty. The patient tolerated the procedure                            well. Scope In: Scope Out: Findings:                 The examined portions of the nasopharynx,                            oropharynx and larynx were normal.                           A low-grade of narrowing Schatzki ring was found in                            the lower third of the esophagus. A guidewire was                            placed and the scope was withdrawn. Dilation was                            performed with a Savary dilator with mild                            resistance at 18 mm. The dilation site was examined                            following endoscope reinsertion and showed mild                            mucosal disruption in the proximal esophagus. The                            ring was unchanged following dilation. Forceps were  then used to 'break' the ring. Samples not sent for                            histology. Estimated blood loss was minimal.                           The exam of the esophagus was otherwise normal.                           Biopsies were obtained from the proximal and distal                            esophagus with cold forceps for histology of                            suspected eosinophilic esophagitis. Estimated blood                            loss was minimal.                           A 5 cm hiatal hernia was present.                           A single 6 mm sessile polyp was found at the                            gastroesophageal junction. Biopsies were taken with                            a cold forceps for histology. Estimated blood loss                            was minimal.                           The exam of the stomach was otherwise normal.                           The examined duodenum was normal. Complications:            No immediate  complications. Estimated Blood Loss:     Estimated blood loss was minimal. Impression:               - The examined portions of the nasopharynx,                            oropharynx and larynx were normal.                           - Low-grade of narrowing Schatzki ring. Dilated and                            biopsied. Dilation produce low grade post dilation  effect in the proximal esophagus.                           - 5 cm hiatal hernia.                           - A single gastroesophageal junction polyp.                            Biopsied. Suspect this is reactive/inflammatory.                           - Normal examined duodenum.                           - Biopsies were taken with a cold forceps for                            evaluation of eosinophilic esophagitis. Recommendation:           - Patient has a contact number available for                            emergencies. The signs and symptoms of potential                            delayed complications were discussed with the                            patient. Return to normal activities tomorrow.                            Written discharge instructions were provided to the                            patient.                           - Resume previous diet.                           - Continue present medications.                           - Await pathology results. Shavon Zenz E. Stacia, MD 06/23/2024 10:30:17 AM This report has been signed electronically.

## 2024-06-24 ENCOUNTER — Telehealth: Payer: Self-pay | Admitting: *Deleted

## 2024-06-24 NOTE — Telephone Encounter (Signed)
  Follow up Call-     06/23/2024    8:20 AM  Call back number  Post procedure Call Back phone  # [phone]  Permission to leave phone message Yes     Patient questions:  Do you have a fever, pain , or abdominal swelling? No. Pain Score  0 *  Have you tolerated food without any problems? Yes.    Have you been able to return to your normal activities? Yes.    Do you have any questions about your discharge instructions: Diet   No. Medications  No. Follow up visit  No.  Do you have questions or concerns about your Care? No.  Actions: * If pain score is 4 or above: No action needed, pain <4.

## 2024-06-25 LAB — SURGICAL PATHOLOGY

## 2024-06-29 ENCOUNTER — Ambulatory Visit: Payer: Self-pay | Admitting: Gastroenterology

## 2024-06-29 NOTE — Progress Notes (Signed)
 Cathy Fuller,  The biopsies of your esophagus showed benign reactive changes (typically related to acid reflux), but no evidence of eosinophilic esophagitis. 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  This is not felt to cause any symptoms or increase your risk for any problems or complications.  No further evaluation or surveillance is needed.  The polyps that I removed from your colon were completely benign but were proven to be pre-cancerous polyps that MAY have grown into cancers if they had not been removed.  Studies shows that at least 20% of women over age 41 and 30% of men over age 34 have pre-cancerous polyps.  Based on current nationally recognized surveillance guidelines following large piecemeal polypectomy, I recommend that you have a repeat colonoscopy in 6-9 months.   If you develop any new rectal bleeding, abdominal pain or significant bowel habit changes, please contact me before then.

## 2024-07-01 DIAGNOSIS — M1711 Unilateral primary osteoarthritis, right knee: Secondary | ICD-10-CM | POA: Diagnosis not present

## 2024-07-01 DIAGNOSIS — S83411A Sprain of medial collateral ligament of right knee, initial encounter: Secondary | ICD-10-CM | POA: Diagnosis not present

## 2024-07-01 DIAGNOSIS — M2351 Chronic instability of knee, right knee: Secondary | ICD-10-CM | POA: Diagnosis not present

## 2024-07-06 DIAGNOSIS — M25561 Pain in right knee: Secondary | ICD-10-CM | POA: Diagnosis not present

## 2024-07-14 DIAGNOSIS — M170 Bilateral primary osteoarthritis of knee: Secondary | ICD-10-CM | POA: Diagnosis not present

## 2024-07-14 DIAGNOSIS — S83231D Complex tear of medial meniscus, current injury, right knee, subsequent encounter: Secondary | ICD-10-CM | POA: Diagnosis not present

## 2024-09-14 DIAGNOSIS — M65861 Other synovitis and tenosynovitis, right lower leg: Secondary | ICD-10-CM | POA: Diagnosis not present

## 2024-09-14 DIAGNOSIS — S83221A Peripheral tear of medial meniscus, current injury, right knee, initial encounter: Secondary | ICD-10-CM | POA: Diagnosis not present

## 2024-09-14 DIAGNOSIS — M84361A Stress fracture, right tibia, initial encounter for fracture: Secondary | ICD-10-CM | POA: Diagnosis not present

## 2024-09-14 DIAGNOSIS — M794 Hypertrophy of (infrapatellar) fat pad: Secondary | ICD-10-CM | POA: Diagnosis not present

## 2024-09-14 DIAGNOSIS — G8918 Other acute postprocedural pain: Secondary | ICD-10-CM | POA: Diagnosis not present

## 2024-09-14 DIAGNOSIS — S83241A Other tear of medial meniscus, current injury, right knee, initial encounter: Secondary | ICD-10-CM | POA: Diagnosis not present

## 2024-09-14 DIAGNOSIS — M94261 Chondromalacia, right knee: Secondary | ICD-10-CM | POA: Diagnosis not present

## 2024-09-21 DIAGNOSIS — M25561 Pain in right knee: Secondary | ICD-10-CM | POA: Diagnosis not present

## 2024-09-24 DIAGNOSIS — M25561 Pain in right knee: Secondary | ICD-10-CM | POA: Diagnosis not present

## 2024-09-28 DIAGNOSIS — M25561 Pain in right knee: Secondary | ICD-10-CM | POA: Diagnosis not present

## 2024-09-30 DIAGNOSIS — E66811 Obesity, class 1: Secondary | ICD-10-CM | POA: Diagnosis not present

## 2024-09-30 DIAGNOSIS — E1165 Type 2 diabetes mellitus with hyperglycemia: Secondary | ICD-10-CM | POA: Diagnosis not present

## 2024-09-30 DIAGNOSIS — Z9189 Other specified personal risk factors, not elsewhere classified: Secondary | ICD-10-CM | POA: Diagnosis not present

## 2024-09-30 DIAGNOSIS — E1122 Type 2 diabetes mellitus with diabetic chronic kidney disease: Secondary | ICD-10-CM | POA: Diagnosis not present

## 2024-09-30 DIAGNOSIS — E785 Hyperlipidemia, unspecified: Secondary | ICD-10-CM | POA: Diagnosis not present

## 2024-09-30 DIAGNOSIS — R011 Cardiac murmur, unspecified: Secondary | ICD-10-CM | POA: Diagnosis not present

## 2024-09-30 DIAGNOSIS — E1169 Type 2 diabetes mellitus with other specified complication: Secondary | ICD-10-CM | POA: Diagnosis not present

## 2024-09-30 DIAGNOSIS — G4733 Obstructive sleep apnea (adult) (pediatric): Secondary | ICD-10-CM | POA: Diagnosis not present

## 2024-09-30 DIAGNOSIS — I129 Hypertensive chronic kidney disease with stage 1 through stage 4 chronic kidney disease, or unspecified chronic kidney disease: Secondary | ICD-10-CM | POA: Diagnosis not present

## 2024-10-01 DIAGNOSIS — M25561 Pain in right knee: Secondary | ICD-10-CM | POA: Diagnosis not present

## 2024-10-06 DIAGNOSIS — M25561 Pain in right knee: Secondary | ICD-10-CM | POA: Diagnosis not present

## 2024-10-12 DIAGNOSIS — M25561 Pain in right knee: Secondary | ICD-10-CM | POA: Diagnosis not present

## 2024-10-15 DIAGNOSIS — M25561 Pain in right knee: Secondary | ICD-10-CM | POA: Diagnosis not present

## 2024-10-16 DIAGNOSIS — E2839 Other primary ovarian failure: Secondary | ICD-10-CM | POA: Diagnosis not present

## 2024-10-16 DIAGNOSIS — M8589 Other specified disorders of bone density and structure, multiple sites: Secondary | ICD-10-CM | POA: Diagnosis not present

## 2024-10-16 DIAGNOSIS — Z1231 Encounter for screening mammogram for malignant neoplasm of breast: Secondary | ICD-10-CM | POA: Diagnosis not present

## 2024-10-19 DIAGNOSIS — M25561 Pain in right knee: Secondary | ICD-10-CM | POA: Diagnosis not present

## 2024-10-20 DIAGNOSIS — R011 Cardiac murmur, unspecified: Secondary | ICD-10-CM | POA: Diagnosis not present
